# Patient Record
Sex: Male | Born: 1980
Health system: Southern US, Community
[De-identification: ages and names within clinical notes are randomized; demographics above are authoritative.]

## PROBLEM LIST (undated history)

## (undated) DIAGNOSIS — J45909 Unspecified asthma, uncomplicated: Secondary | ICD-10-CM

## (undated) HISTORY — DX: Unspecified asthma, uncomplicated: J45.909

## (undated) HISTORY — PX: OTHER SURGICAL HISTORY: SHX169

---

## 2020-02-28 ENCOUNTER — Other Ambulatory Visit: Payer: Self-pay

## 2020-02-28 ENCOUNTER — Encounter: Payer: Self-pay | Admitting: Allergy

## 2020-02-28 ENCOUNTER — Ambulatory Visit: Payer: Commercial Managed Care - PPO | Admitting: Allergy

## 2020-02-28 VITALS — BP 110/80 | HR 57 | Temp 97.2°F | Resp 16 | Ht 67.0 in | Wt 167.8 lb

## 2020-02-28 DIAGNOSIS — J3089 Other allergic rhinitis: Secondary | ICD-10-CM | POA: Insufficient documentation

## 2020-02-28 DIAGNOSIS — J4530 Mild persistent asthma, uncomplicated: Secondary | ICD-10-CM | POA: Diagnosis not present

## 2020-02-28 DIAGNOSIS — T781XXA Other adverse food reactions, not elsewhere classified, initial encounter: Secondary | ICD-10-CM

## 2020-02-28 DIAGNOSIS — H1013 Acute atopic conjunctivitis, bilateral: Secondary | ICD-10-CM | POA: Insufficient documentation

## 2020-02-28 MED ORDER — AZELASTINE-FLUTICASONE 137-50 MCG/ACT NA SUSP: 1.0000 | Freq: Two times a day (BID) | NASAL | 5 refills | Status: DC

## 2020-02-28 MED ORDER — ARNUITY ELLIPTA 100 MCG/ACT IN AEPB: 1.0000 | INHALATION_SPRAY | Freq: Every day | RESPIRATORY_TRACT | 5 refills | Status: DC

## 2020-02-28 NOTE — Assessment & Plan Note (Addendum)
Lately noticing that after drinking alcohol he is developing headaches and some red eyes for about 2 hours.  Today's skin testing showed: Borderline positive to barley and hops. This may be irrelevant sensitization as he is not having any IgE mediated reactions after alcohol ingestion.    Avoid alcohol for now as alcohol can trigger headaches.

## 2020-02-28 NOTE — Assessment & Plan Note (Signed)
Noting wheezing and shortness of breath for 1 month mainly at night.  Using albuterol every night with good benefit.  Previously had some mild exercise-induced bronchospasm.  Ex-smoker -quit in 2019.  Today's spirometry showed: normal pattern with 10% and greater than 200cc improvement in FEV1 post bronchodilator treatment. Clinically feeling improved.  This is concerning for reactive airway disease/asthma. . Daily controller medication(s): start Arnuity 1 puff once a day and rinse mouth after each use.  Demonstrated proper use. . May use albuterol rescue inhaler 2 puffs every 4 to 6 hours as needed for shortness of breath, chest tightness, coughing, and wheezing. May use albuterol rescue inhaler 2 puffs 5 to 15 minutes prior to strenuous physical activities. Monitor frequency of use.  . Repeat spirometry at next visit.

## 2020-02-28 NOTE — Assessment & Plan Note (Signed)
   See assessment and plan as above allergic rhinitis.  Declines eyedrops.

## 2020-02-28 NOTE — Assessment & Plan Note (Signed)
Perennial rhinoconjunctivitis symptoms for many years but worsening in the spring and fall.  Tried Zyrtec, Claritin, Allegra, Flonase, Sudafed with minimal benefit.  Going to ENT this week.  Four sinus infections this year.  Today's skin testing showed: Positive to grass, weed, ragweed, trees, mold, cockroach, dust mites.   Start environmental control measures as below.  May use over the counter antihistamines such as Zyrtec (cetirizine), Claritin (loratadine), Allegra (fexofenadine), or Xyzal (levocetirizine) daily as needed. May take twice a day if needed.  Start dymista (fluticasone + azelastine nasal spray combination) 1 spray per nostril twice a day. This replaces Flonase (fluticasone) for now. If it's not covered let us know.   If above regimen does not control symptoms then will discuss starting allergy immunotherapy in more detail at next visit.  Declines starting Singulair at this time.  Read about allergy injections - handout given.

## 2020-02-28 NOTE — Progress Notes (Signed)
New Patient Note  RE: Tracy Pugh MRN: [id number] DOB: Jul 16, 1980 Date of Office Visit: 02/28/2020  Referring provider: No ref. provider found Primary care provider: Patient, No Pcp Per  Chief Complaint: Allergies and Asthma  History of Present Illness: I had the pleasure of seeing Tracy Pugh for initial evaluation at the [facility] on 02/28/2020. He is a 39 y.o. male, who is self-referred here for the evaluation of allergies and asthma.  He reports symptoms of nasal congestion, rhinorrhea, PND, itchy/watery eyes. Symptoms have been going on for many years. The symptoms are present all year around with worsening in spring and fall. Other triggers include exposure to unknown. Anosmia: diminished sense of smell. Headache: yes. He has used zyrtec, Claritin, Allegra,, Flonase 1 spray once a day, sudafed with minimal improvement in symptoms. Sinus infections: 2 times per year but this year already had 4 - treated with antibiotics with minimal benefit. Previous work up includes: none. Previous ENT evaluation: no but going on Thursday. Previous sinus imaging: no. History of nasal polyps: no. Last eye exam: not recently. History of reflux: yes sometimes. Alcohol causes some headaches for 2 hours.   Breathing:  He reports symptoms of wheezing, shortness of breath at night for the past 1 month. Current medications include albuterol prn which help. He reports not using aerochamber with inhalers. He tried the following inhalers: albuterol prn. Main triggers are unknown and exertion. In the last month, frequency of symptoms: nightly. Frequency of nocturnal symptoms: 0x/month. Frequency of SABA use: nightly. Interference with physical activity: rarely. Sleep is undisturbed. In the last 12 months, emergency room visits/urgent care visits/doctor office visits or hospitalizations due to respiratory issues: 0. In the last 12 months, oral steroids courses: 2. Lifetime history of  hospitalization for respiratory issues: no. Prior intubations: no. History of pneumonia: no. He was not evaluated by allergist/pulmonologist in the past. Smoking exposure: quit in 2019. Up to date with flu vaccine: no. Up to date with COVID-19 vaccine: no.   Assessment and Plan: Tracy Pugh is a 39 y.o. male with: Mild persistent asthma without complication Noting wheezing and shortness of breath for 1 month mainly at night.  Using albuterol every night with good benefit.  Previously had some mild exercise-induced bronchospasm.  Ex-smoker -quit in 2019.  Today's spirometry showed: normal pattern with 10% and greater than 200cc improvement in FEV1 post bronchodilator treatment. Clinically feeling improved.  This is concerning for reactive airway disease/asthma. . Daily controller medication(s): start Arnuity 1 puff once a day and rinse mouth after each use.  Demonstrated proper use. . May use albuterol rescue inhaler 2 puffs every 4 to 6 hours as needed for shortness of breath, chest tightness, coughing, and wheezing. May use albuterol rescue inhaler 2 puffs 5 to 15 minutes prior to strenuous physical activities. Monitor frequency of use.  . Repeat spirometry at next visit.  Other allergic rhinitis Perennial rhinoconjunctivitis symptoms for many years but worsening in the spring and fall.  Tried Zyrtec, Claritin, Allegra, Flonase, Sudafed with minimal benefit.  Going to ENT this week.  Four sinus infections this year.  Today's skin testing showed: Positive to grass, weed, ragweed, trees, mold, cockroach, dust mites.   Start environmental control measures as below.  May use over the counter antihistamines such as Zyrtec (cetirizine), Claritin (loratadine), Allegra (fexofenadine), or Xyzal (levocetirizine) daily as needed. May take twice a day if needed.  Start dymista (fluticasone + azelastine nasal spray combination) 1 spray per nostril twice  a day. This replaces Flonase (fluticasone) for now.  If it's not covered let us know.   If above regimen does not control symptoms then will discuss starting allergy immunotherapy in more detail at next visit.  Declines starting Singulair at this time.  Read about allergy injections - handout given.  Allergic conjunctivitis of both eyes  See assessment and plan as above allergic rhinitis.  Declines eyedrops.  Adverse food reaction Lately noticing that after drinking alcohol he is developing headaches and some red eyes for about 2 hours.  Today's skin testing showed: Borderline positive to barley and hops. This may be irrelevant sensitization as he is not having any IgE mediated reactions after alcohol ingestion.    Avoid alcohol for now as alcohol can trigger headaches.   Return in about 2 months (around 04/29/2020).  Recommend establishing care with a PCP.  Keep ENT appointment.  Recommend getting COVID-19 vaccination.  Meds ordered this encounter  Medications  . Fluticasone Furoate (ARNUITY ELLIPTA) 100 MCG/ACT AEPB    Sig: Inhale 1 puff into the lungs daily. And rinse mouth after each use.    Dispense:  30 each    Refill:  5  . Azelastine-Fluticasone 137-50 MCG/ACT SUSP    Sig: Place 1 spray into the nose in the morning and at bedtime.    Dispense:  23 g    Refill:  5   Other allergy screening: Food allergy: no Medication allergy: no Hymenoptera allergy: no Urticaria: no Eczema:no History of recurrent infections suggestive of immunodeficency: no  Diagnostics: Spirometry:  Tracings reviewed. His effort: Good reproducible efforts. FVC: 4.51L FEV1: 3.66L, 93% predicted FEV1/FVC ratio: 81% Interpretation: Spirometry consistent with normal pattern with 10% and greater than 200cc improvement in FEV1 post bronchodilator treatment. Clinically feeling improved. Please see scanned spirometry results for details.  Skin Testing: Environmental allergy panel and select foods. Positive to grass, weed, ragweed, trees, mold,  cockroach, dust mites.  Borderline to barley and hops.  Results discussed with patient/family.  Airborne Adult Perc - 02/28/20 0945    Time Antigen Placed 1610    Allergen Manufacturer Waynette Buttery    Location Back    Number of Test 59    Panel 1 Select    1. Control-Buffer 50% Glycerol Negative    2. Control-Histamine 1 mg/ml 2+    3. Albumin saline Negative    4. Bahia 2+    5. [place] 2+    6. Johnson 2+    7. Kentucky Blue 2+    8. Meadow Fescue Negative    9. Perennial Rye Negative    10. Sweet Vernal Negative    11. Timothy Negative    12. Cocklebur Negative    13. Burweed Marshelder Negative    14. Ragweed, short 3+    15. Ragweed, Giant Negative    16. Plantain,  English 3+    17. Lamb's Quarters 2+    18. Sheep Sorrell 2+    19. Rough Pigweed 2+    20. Marsh Elder, Rough 2+    21. Mugwort, Common 2+    22. Ash mix 3+    23. Birch mix 2+    24. Beech American 2+    25. Box, Elder 3+    26. Cedar, red 2+    27. Cottonwood, Eastern 2+    28. Elm mix 2+    29. Hickory 2+    30. Maple mix Negative    31. Oak, Guinea[place]issau mix 2+    32.  Pecan Pollen 2+    33. Pine mix Negative    34. Sycamore Eastern 2+    35. Walnut, Black Pollen 2+    36. Alternaria alternata 3+    37. Cladosporium Herbarum --   +/-   38. Aspergillus mix Negative    39. Penicillium mix Negative    40. Bipolaris sorokiniana (Helminthosporium) Negative    41. Drechslera spicifera (Curvularia) Negative    42. Mucor plumbeus Negative    43. Fusarium moniliforme 2+    44. Aureobasidium pullulans (pullulara) Negative    45. Rhizopus oryzae Negative    46. Botrytis cinera Negative    47. Epicoccum nigrum 2+    48. Phoma betae Negative    49. Candida Albicans Negative    50. Trichophyton mentagrophytes Negative    51. Mite, D Farinae  5,000 AU/ml Negative    52. Mite, D Pteronyssinus  5,000 AU/ml Negative    53. Cat Hair 10,000 BAU/ml Negative    54.  Dog Epithelia Negative    55. Mixed Feathers  Negative    56. Horse Epithelia Negative    57. Cockroach, German Negative    58. Mouse Negative    59. Tobacco Leaf Negative          Intradermal - 02/28/20 1021    Time Antigen Placed 1021    Allergen Manufacturer Waynette Buttery    Location Arm    Number of Test 7    Control Negative    Mold 2 2+    Mold 3 Negative    Cat Negative    Dog Negative    Cockroach 3+    Mite mix 3+          Food Adult Perc - 02/28/20 0900    Time Antigen Placed 1505    Allergen Manufacturer Waynette Buttery    Location Back    Number of allergen test 6    3. Wheat Negative    30. Barley --   +/-   31. Oat  Negative    32. Rye  Negative    33. Hops --   +/-   34. Rice Negative    36. Saccharomyces Cerevisiae  Negative           Past Medical History: Patient Active Problem List   Diagnosis Date Noted  . Mild persistent asthma without complication 02/28/2020  . Other allergic rhinitis 02/28/2020  . Adverse food reaction 02/28/2020  . Allergic conjunctivitis of both eyes 02/28/2020   Past Medical History:  Diagnosis Date  . Asthma    Past Surgical History: History reviewed. No pertinent surgical history. Medication List:  Current Outpatient Medications  Medication Sig Dispense Refill  . cetirizine (ZYRTEC) 10 MG tablet Take 10 mg by mouth daily as needed for allergies.    Marland Kitchen albuterol (VENTOLIN HFA) 108 (90 Base) MCG/ACT inhaler Inhale into the lungs.    . Azelastine-Fluticasone 137-50 MCG/ACT SUSP Place 1 spray into the nose in the morning and at bedtime. 23 g 5  . Fluticasone Furoate (ARNUITY ELLIPTA) 100 MCG/ACT AEPB Inhale 1 puff into the lungs daily. And rinse mouth after each use. 30 each 5   No current facility-administered medications for this visit.   Allergies: No Known Allergies Social History: Social History   Socioeconomic History  . Marital status: Married    Spouse name: Not on file  . Number of children: Not on file  . Years of education: Not on file  . Highest education  level: Not  on file  Occupational History  . Not on file  Tobacco Use  . Smoking status: Former Smoker    Quit date: 2019    Years since quitting: 2.6  . Smokeless tobacco: Never Used  Vaping Use  . Vaping Use: Never used  Substance and Sexual Activity  . Alcohol use: Yes  . Drug use: Never  . Sexual activity: Not on file  Other Topics Concern  . Not on file  Social History Narrative  . Not on file   Social Determinants of Health   Financial Resource Strain:   . Difficulty of Paying Living Expenses: Not on file  Food Insecurity:   . Worried About Programme researcher, broadcasting/film/video in the Last Year: Not on file  . Ran Out of Food in the Last Year: Not on file  Transportation Needs:   . Lack of Transportation (Medical): Not on file  . Lack of Transportation (Non-Medical): Not on file  Physical Activity:   . Days of Exercise per Week: Not on file  . Minutes of Exercise per Session: Not on file  Stress:   . Feeling of Stress : Not on file  Social Connections:   . Frequency of Communication with Friends and Family: Not on file  . Frequency of Social Gatherings with Friends and Family: Not on file  . Attends Religious Services: Not on file  . Active Member of Clubs or Organizations: Not on file  . Attends Banker Meetings: Not on file  . Marital Status: Not on file   Lives in a house built in 1932. Smoking: quit in 2019 Occupation: Personnel officer History: Water Damage/mildew in the house: no Carpet in the family room: no Carpet in the bedroom: no Heating: electric Cooling: central Pet: yes 1 dog x 15 yrs  Family History: History reviewed. No pertinent family history. Problem                               Relation Allergic rhino conjunctivitis     Daughter   Review of Systems  Constitutional: Negative for appetite change, chills, fever and unexpected weight change.  HENT: Positive for congestion and rhinorrhea.   Eyes: Positive for itching.  Respiratory:  Positive for wheezing. Negative for cough, chest tightness and shortness of breath.   Cardiovascular: Negative for chest pain.  Gastrointestinal: Negative for abdominal pain.  Genitourinary: Negative for difficulty urinating.  Skin: Negative for rash.  Allergic/Immunologic: Positive for environmental allergies.  Neurological: Negative for headaches.   Objective: BP 110/80   Pulse (!) 57   Temp (!) 97.2 F (36.2 C) (Temporal)   Resp 16   Ht 5\' 7"  (1.702 m)   Wt 167 lb 12.8 oz (76.1 kg)   SpO2 97%   BMI 26.28 kg/m  Body mass index is 26.28 kg/m. Physical Exam Vitals and nursing note reviewed.  Constitutional:      Appearance: Normal appearance. He is well-developed.  HENT:     Head: Normocephalic and atraumatic.     Right Ear: External ear normal.     Left Ear: External ear normal.     Nose: Rhinorrhea present.     Mouth/Throat:     Mouth: Mucous membranes are moist.     Pharynx: Oropharynx is clear.  Eyes:     Conjunctiva/sclera: Conjunctivae normal.  Cardiovascular:     Rate and Rhythm: Normal rate and regular rhythm.     Heart sounds:  Normal heart sounds. No murmur heard.  No friction rub. No gallop.   Pulmonary:     Effort: Pulmonary effort is normal.     Breath sounds: Normal breath sounds. No wheezing, rhonchi or rales.  Musculoskeletal:     Cervical back: Neck supple.  Skin:    General: Skin is warm.     Findings: No rash.  Neurological:     Mental Status: He is alert and oriented to person, place, and time.  Psychiatric:        Behavior: Behavior normal.    The plan was reviewed with the patient/family, and all questions/concerned were addressed.  It was my pleasure to see Tracy Pugh today and participate in his care. Please feel free to contact me with any questions or concerns.  Sincerely,  Wyline MoodYoon Hillis Mcphatter, DO Allergy & Immunology  [facility] [place] office: [phone] [facility] office: [phone] [place] office:  [phone]

## 2020-02-28 NOTE — Patient Instructions (Addendum)
Today's skin testing showed: Positive to grass, weed, ragweed, trees, mold, cockroach, dust mites.  Borderline to barley and hops.   Environmental allergies  Start environmental control measures as below.  May use over the counter antihistamines such as Zyrtec (cetirizine), Claritin (loratadine), Allegra (fexofenadine), or Xyzal (levocetirizine) daily as needed. May take twice a day if needed.  Start dymista (fluticasone + azelastine nasal spray combination) 1 spray per nostril twice a day. This replaces Flonase (fluticasone) for now. If it's not covered let us know.   Read about allergy injections.   Breathing: . Daily controller medication(s): start Arnuity 1 puff once a day and rinse mouth after each use.  . May use albuterol rescue inhaler 2 puffs every 4 to 6 hours as needed for shortness of breath, chest tightness, coughing, and wheezing. May use albuterol rescue inhaler 2 puffs 5 to 15 minutes prior to strenuous physical activities. Monitor frequency of use.  . Asthma control goals:  o Full participation in all desired activities (may need albuterol before activity) o Albuterol use two times or less a week on average (not counting use with activity) o Cough interfering with sleep two times or less a month o Oral steroids no more than once a year o No hospitalizations  Food:  Avoid alcohol for now.  Follow up in 2 months or sooner if needed.  Recommend establishing care with a PCP.  Keep ENT appointment.   Recommend getting the COVID-19 vaccine.  Here's more information: [email or web address]  Reducing Pollen Exposure . Pollen seasons: trees (spring), grass (summer) and ragweed/weeds (fall). Marland Kitchen Keep windows closed in your home and car to lower pollen exposure.  Lilian Kapur air conditioning in the bedroom and throughout the house if possible.  . Avoid going out in dry windy days - especially early morning. . Pollen counts are highest between 5 - 10 AM  and on dry, hot and windy days.  . Save outside activities for late afternoon or after a heavy rain, when pollen levels are lower.  . Avoid mowing of grass if you have grass pollen allergy. Marland Kitchen Be aware that pollen can also be transported indoors on people and pets.  . Dry your clothes in an automatic dryer rather than hanging them outside where they might collect pollen.  . Rinse hair and eyes before bedtime. Mold Control . Mold and fungi can grow on a variety of surfaces provided certain temperature and moisture conditions exist.  . Outdoor molds grow on plants, decaying vegetation and soil. The major outdoor mold, Alternaria and Cladosporium, are found in very high numbers during hot and dry conditions. Generally, a late summer - fall peak is seen for common outdoor fungal spores. Rain will temporarily lower outdoor mold spore count, but counts rise rapidly when the rainy period ends. . The most important indoor molds are Aspergillus and Penicillium. Dark, humid and poorly ventilated basements are ideal sites for mold growth. The next most common sites of mold growth are the bathroom and the kitchen. Outdoor (Seasonal) Mold Control . Use air conditioning and keep windows closed. . Avoid exposure to decaying vegetation. Marland Kitchen Avoid leaf raking. . Avoid grain handling. . Consider wearing a face mask if working in moldy areas.  Indoor (Perennial) Mold Control  . Maintain humidity below 50%. . Get rid of mold growth on hard surfaces with water, detergent and, if necessary, 5% bleach (do not mix with other cleaners). Then dry the area completely. If mold covers an area more than 10  square feet, consider hiring an Gaffer. . For clothing, washing with soap and water is best. If moldy items cannot be cleaned and dried, throw them away. . Remove sources e.g. contaminated carpets. . Repair and seal leaking roofs or pipes. Using dehumidifiers in damp basements may be helpful, but  empty the water and clean units regularly to prevent mildew from forming. All rooms, especially basements, bathrooms and kitchens, require ventilation and cleaning to deter mold and mildew growth. Avoid carpeting on concrete or damp floors, and storing items in damp areas. Cockroach Allergen Avoidance Cockroaches are often found in the homes of densely populated urban areas, schools or commercial buildings, but these creatures can lurk almost anywhere. This does not mean that you have a dirty house or living area. . Block all areas where roaches can enter the home. This includes crevices, wall cracks and windows.  . Cockroaches need water to survive, so fix and seal all leaky faucets and pipes. Have an exterminator go through the house when your family and pets are gone to eliminate any remaining roaches. Marland Kitchen Keep food in lidded containers and put pet food dishes away after your pets are done eating. Vacuum and sweep the floor after meals, and take out garbage and recyclables. Use lidded garbage containers in the kitchen. Wash dishes immediately after use and clean under stoves, refrigerators or toasters where crumbs can accumulate. Wipe off the stove and other kitchen surfaces and cupboards regularly. Control of House Dust Mite Allergen . Dust mite allergens are a common trigger of allergy and asthma symptoms. While they can be found throughout the house, these microscopic creatures thrive in warm, humid environments such as bedding, upholstered furniture and carpeting. . Because so much time is spent in the bedroom, it is essential to reduce mite levels there.  . Encase pillows, mattresses, and box springs in special allergen-proof fabric covers or airtight, zippered plastic covers.  . Bedding should be washed weekly in hot water (130 F) and dried in a hot dryer. Allergen-proof covers are available for comforters and pillows that can't be regularly washed.  Reyes Ivan the allergy-proof covers every few  months. Minimize clutter in the bedroom. Keep pets out of the bedroom.  Marland Kitchen Keep humidity less than 50% by using a dehumidifier or air conditioning. You can buy a humidity measuring device called a hygrometer to monitor this.  . If possible, replace carpets with hardwood, linoleum, or washable area rugs. If that's not possible, vacuum frequently with a vacuum that has a HEPA filter. . Remove all upholstered furniture and non-washable window drapes from the bedroom. . Remove all non-washable stuffed toys from the bedroom.  Wash stuffed toys weekly.

## 2020-03-13 ENCOUNTER — Emergency Department (HOSPITAL_BASED_OUTPATIENT_CLINIC_OR_DEPARTMENT_OTHER)
Admission: EM | Admit: 2020-03-13 | Discharge: 2020-03-13 | Disposition: A | Discharge status: Home / Self Care | Payer: Commercial Managed Care - PPO | Attending: Emergency Medicine | Admitting: Emergency Medicine

## 2020-03-13 ENCOUNTER — Other Ambulatory Visit: Payer: Self-pay

## 2020-03-13 ENCOUNTER — Emergency Department (HOSPITAL_BASED_OUTPATIENT_CLINIC_OR_DEPARTMENT_OTHER): Payer: Commercial Managed Care - PPO

## 2020-03-13 ENCOUNTER — Encounter (HOSPITAL_BASED_OUTPATIENT_CLINIC_OR_DEPARTMENT_OTHER): Payer: Self-pay | Admitting: *Deleted

## 2020-03-13 DIAGNOSIS — J4530 Mild persistent asthma, uncomplicated: Secondary | ICD-10-CM | POA: Diagnosis not present

## 2020-03-13 DIAGNOSIS — Z7951 Long term (current) use of inhaled steroids: Secondary | ICD-10-CM | POA: Diagnosis not present

## 2020-03-13 DIAGNOSIS — Z87891 Personal history of nicotine dependence: Secondary | ICD-10-CM | POA: Diagnosis not present

## 2020-03-13 DIAGNOSIS — R519 Headache, unspecified: Secondary | ICD-10-CM | POA: Diagnosis present

## 2020-03-13 DIAGNOSIS — J0191 Acute recurrent sinusitis, unspecified: Secondary | ICD-10-CM | POA: Diagnosis not present

## 2020-03-13 LAB — CBC WITH DIFFERENTIAL/PLATELET
Abs Immature Granulocytes: 0.03 10*3/uL (ref 0.00–0.07)
Basophils Absolute: 0.1 10*3/uL (ref 0.0–0.1)
Basophils Relative: 1 %
Eosinophils Absolute: 0.2 10*3/uL (ref 0.0–0.5)
Eosinophils Relative: 2 %
HCT: 49.6 % (ref 39.0–52.0)
Hemoglobin: 16.9 g/dL (ref 13.0–17.0)
Immature Granulocytes: 0 %
Lymphocytes Relative: 28 %
Lymphs Abs: 2.7 10*3/uL (ref 0.7–4.0)
MCH: 30.3 pg (ref 26.0–34.0)
MCHC: 34.1 g/dL (ref 30.0–36.0)
MCV: 89 fL (ref 80.0–100.0)
Monocytes Absolute: 0.9 10*3/uL (ref 0.1–1.0)
Monocytes Relative: 9 %
Neutro Abs: 5.8 10*3/uL (ref 1.7–7.7)
Neutrophils Relative %: 60 %
Platelets: 348 10*3/uL (ref 150–400)
RBC: 5.57 MIL/uL (ref 4.22–5.81)
RDW: 11.9 % (ref 11.5–15.5)
WBC: 9.6 10*3/uL (ref 4.0–10.5)
nRBC: 0 % (ref 0.0–0.2)

## 2020-03-13 LAB — BASIC METABOLIC PANEL
Anion gap: 13 (ref 5–15)
BUN: 7 mg/dL (ref 6–20)
CO2: 26 mmol/L (ref 22–32)
Calcium: 9.1 mg/dL (ref 8.9–10.3)
Chloride: 99 mmol/L (ref 98–111)
Creatinine, Ser: 0.88 mg/dL (ref 0.61–1.24)
GFR calc Af Amer: >60 mL/min (ref >60)
GFR calc non Af Amer: >60 mL/min (ref >60)
Glucose, Bld: 97 mg/dL (ref 70–99)
Potassium: 3.8 mmol/L (ref 3.5–5.1)
Sodium: 138 mmol/L (ref 135–145)

## 2020-03-13 MED ORDER — SALINE SPRAY 0.65 % NA SOLN: 1.0000 | NASAL | 0 refills | Status: AC | PRN

## 2020-03-13 MED ORDER — METOCLOPRAMIDE HCL 10 MG PO TABS
5.0000 mg | ORAL_TABLET | Freq: Once | ORAL | Status: AC
  Administered 2020-03-13: 5 mg via ORAL
  Filled 2020-03-13: qty 1

## 2020-03-13 MED ORDER — IOHEXOL 300 MG/ML  SOLN
100.0000 mL | Freq: Once | INTRAMUSCULAR | Status: AC | PRN
  Administered 2020-03-13: 75 mL via INTRAVENOUS

## 2020-03-13 MED ORDER — AMOXICILLIN-POT CLAVULANATE 875-125 MG PO TABS: 1.0000 | ORAL_TABLET | Freq: Two times a day (BID) | ORAL | 0 refills | Status: DC

## 2020-03-13 MED ORDER — KETOROLAC TROMETHAMINE 30 MG/ML IJ SOLN
30.0000 mg | Freq: Once | INTRAMUSCULAR | Status: AC
  Administered 2020-03-13: 30 mg via INTRAVENOUS

## 2020-03-13 MED ORDER — KETOROLAC TROMETHAMINE 30 MG/ML IJ SOLN
30.0000 mg | Freq: Once | INTRAMUSCULAR | Status: DC
  Filled 2020-03-13: qty 1

## 2020-03-13 NOTE — Discharge Instructions (Signed)
You were given a prescription for antibiotics. Please take the antibiotic prescription fully.   Please follow up with your primary care provider within 5-7 days for re-evaluation of your symptoms. If you do not have a primary care provider, information for a healthcare clinic has been provided for you to make arrangements for follow up care. Please return to the emergency department for any new or worsening symptoms.  

## 2020-03-13 NOTE — ED Provider Notes (Signed)
[facility] EMERGENCY DEPARTMENT Provider Note   CSN: [id number] Arrival date & time: 03/13/20  1226     History Chief Complaint  Patient presents with  . Facial Pain    Tracy Pugh is a 39 y.o. male.  HPI   Pt is a 39 y/o male with a h/o asthma who presents to the ED today for eval of right sided facial pain. He is c/o pain behind the right eye, in the right ear and in the right side of his jaw.  States he has a history of recurrent sinusitis and is on nasal decongestions and is currently on oral steroids as well.  He last had antibiotics about 1 month ago.  He reports nasal congestion, rhinorrhea but denies fevers.   States he called his ent office for an earlier appt. He is scheduled for an outpt ct.  Past Medical History:  Diagnosis Date  . Asthma     Patient Active Problem List   Diagnosis Date Noted  . Mild persistent asthma without complication 02/28/2020  . Other allergic rhinitis 02/28/2020  . Adverse food reaction 02/28/2020  . Allergic conjunctivitis of both eyes 02/28/2020    History reviewed. No pertinent surgical history.     History reviewed. No pertinent family history.  Social History   Tobacco Use  . Smoking status: Former Smoker    Quit date: 2019    Years since quitting: 2.7  . Smokeless tobacco: Never Used  Vaping Use  . Vaping Use: Never used  Substance Use Topics  . Alcohol use: Yes  . Drug use: Never    Home Medications Prior to Admission medications   Medication Sig Start Date End Date Taking? Authorizing Provider  albuterol (VENTOLIN HFA) 108 (90 Base) MCG/ACT inhaler Inhale into the lungs. 12/07/19   [provider]  amoxicillin-clavulanate (AUGMENTIN) 875-125 MG tablet Take 1 tablet by mouth every 12 (twelve) hours. 03/13/20   Joao Mccurdy S, PA-C  Azelastine-Fluticasone 137-50 MCG/ACT SUSP Place 1 spray into the nose in the morning and at bedtime. 02/28/20   Ellamae Sia, DO  cetirizine (ZYRTEC) 10 MG  tablet Take 10 mg by mouth daily as needed for allergies.    [provider]  Fluticasone Furoate (ARNUITY ELLIPTA) 100 MCG/ACT AEPB Inhale 1 puff into the lungs daily. And rinse mouth after each use. 02/28/20   Ellamae Sia, DO  sodium chloride (OCEAN) 0.65 % SOLN nasal spray Place 1 spray into both nostrils as needed for congestion. 03/13/20   Coleton Woon S, PA-C    Allergies    Patient has no known allergies.  Review of Systems   Review of Systems  Constitutional: Negative for fever.  HENT: Positive for congestion, ear pain, sinus pressure and sinus pain.   Eyes: Positive for pain.  Respiratory: Negative for cough and shortness of breath.   Cardiovascular: Negative for chest pain.  Gastrointestinal: Negative for abdominal pain, nausea and vomiting.  Genitourinary: Negative for dysuria and hematuria.  Musculoskeletal: Negative for back pain.  Skin: Negative for rash.  Neurological: Positive for headaches. Negative for dizziness, weakness, light-headedness and numbness.  All other systems reviewed and are negative.   Physical Exam Updated Vital Signs BP 119/79   Pulse (!) 57   Temp 98.7 F (37.1 C) (Oral)   Resp 16   Ht 5\' 7"  (1.702 m)   Wt 74.8 kg   SpO2 99%   BMI 25.84 kg/m   Physical Exam Vitals and nursing note reviewed.  Constitutional:      Appearance: He is well-developed.  HENT:     Head: Normocephalic and atraumatic.     Comments: ttp over the right maxillary sinus    Left Ear: Tympanic membrane normal.     Ears:     Comments: Right tm erythematous.    Nose: Congestion present.     Comments: Nasal turbinates swollen nasal turbinates are swollen    Mouth/Throat:     Pharynx: No oropharyngeal exudate or posterior oropharyngeal erythema.  Eyes:     General:        Right eye: No discharge.     Extraocular Movements: Extraocular movements intact.     Conjunctiva/sclera: Conjunctivae normal.     Pupils: Pupils are equal, round, and reactive to  light.     Comments: No swelling around the orbits. No pain with EOM.  Cardiovascular:     Rate and Rhythm: Normal rate and regular rhythm.     Heart sounds: No murmur heard.   Pulmonary:     Effort: Pulmonary effort is normal. No respiratory distress.     Breath sounds: Normal breath sounds.  Abdominal:     General: Bowel sounds are normal.     Palpations: Abdomen is soft.     Tenderness: There is no abdominal tenderness.  Musculoskeletal:     Cervical back: Neck supple.  Skin:    General: Skin is warm and dry.  Neurological:     Mental Status: He is alert.     Comments: Mental Status:  Alert, thought content appropriate, able to give a coherent history. Speech fluent without evidence of aphasia. Able to follow 2 step commands without difficulty.  Cranial Nerves:  II:  pupils equal, round, reactive to light III,IV, VI: ptosis not present, extra-ocular motions intact bilaterally  V,VII: smile symmetric, facial light touch sensation equal VIII: hearing grossly normal to voice  X: uvula elevates symmetrically  XI: bilateral shoulder shrug symmetric and strong XII: midline tongue extension without fassiculations Motor:  Normal tone. 5/5 strength of BUE and BLE major muscle groups including strong and equal grip strength and dorsiflexion/plantar flexion Sensory: light touch normal in all extremities.     ED Results / Procedures / Treatments   Labs (all labs ordered are listed, but only abnormal results are displayed) Labs Reviewed  BASIC METABOLIC PANEL  CBC WITH DIFFERENTIAL/PLATELET    EKG None  Radiology CT Maxillofacial W Contrast  Result Date: 03/13/2020 CLINICAL DATA:  Maxillofacial pain; recurrent sinus infection. Additional provided: Patient reports ongoing right-sided facial pain with ear, jaw and eye pain for 6 weeks. EXAM: CT MAXILLOFACIAL WITH CONTRAST TECHNIQUE: Multidetector CT imaging of the maxillofacial structures was performed with intravenous contrast.  Multiplanar CT image reconstructions were also generated. CONTRAST:  74mL OMNIPAQUE IOHEXOL 300 MG/ML  SOLN COMPARISON:  No pertinent prior exams are available for comparison. FINDINGS: Osseous: No osseous erosion is identified. Orbits: No acute finding. The globes are normal in size and contour. The extraocular muscles and optic nerve sheath complexes are symmetric and unremarkable. Sinuses: There is extensive partial opacification of the left frontal sinus. Mild partial opacification of the inferior right frontal sinus. Complete opacification of the frontal sinus drainage pathways. Severe, extensive opacification of bilateral ethmoid air cells. Complete opacification of the right maxillary sinus. There is possible widening of the right maxillary sinus ostium and ethmoid infundibulum (for instance as seen on series 4, images 30-40). There is opacification of the right hiatus semilunaris and middle meatus. Moderate-sized mucous  retention cyst versus polypoid mucosal thickening within the inferior left maxillary sinus. The left maxillary sinus ostium is partially obstructed by secretions. Mild bilateral sphenoid sinus mucosal thickening Soft tissues: The visualized maxillofacial and upper neck soft tissues are unremarkable. Limited intracranial: No acute intracranial abnormality is identified. Other: Rightward deviation of the anterior bony nasal septum. IMPRESSION: Extensive paranasal sinus disease as outlined and with findings most notably as follows. There is complete opacification of the right maxillary sinus with opacification and possible widening of the right maxillary sinus ostium and ethmoid infundibulum. There is complete opacification of the right hiatus semilunaris and middle meatus. Extensive partial opacification of the left frontal sinus. Complete opacification of the frontal sinus drainage pathways. Extensive opacification of the bilateral ethmoid air cells. ENT consultation should be considered.  There is no evidence of frank osseous erosion or orbital extension. Electronically Signed   By: Jackey LogeKyle  Golden DO   On: 03/13/2020 18:08    Procedures Procedures (including critical care time)  Medications Ordered in ED Medications  metoCLOPramide (REGLAN) tablet 5 mg (5 mg Oral Given 03/13/20 1644)  ketorolac (TORADOL) 30 MG/ML injection 30 mg (30 mg Intravenous Given 03/13/20 1654)  iohexol (OMNIPAQUE) 300 MG/ML solution 100 mL (75 mLs Intravenous Contrast Given 03/13/20 1742)    ED Course  I have reviewed the triage vital signs and the nursing notes.  Pertinent labs & imaging results that were available during my care of the patient were reviewed by me and considered in my medical decision making (see chart for details).    MDM Rules/Calculators/A&P                          39 year old male presenting for evaluation of sinus pressors/pain, right-sided headache.  Neurologic exam is within normal limits on my eval.  Right TM is erythematous.  He also has tenderness over the right maxillary sinus with nasal congestion and swollen nasal turbinates.  Reviewed/interpreted labs which were reassuring and did not show evidence of leukocytosis.  Normal renal function.  CT maxillofacial with contrast was ordered which showed - Extensive paranasal sinus disease as outlined and with findings most notably as follows. There is complete opacification of the right maxillary sinus with opacification and possible widening of the right maxillary sinus ostium and ethmoid infundibulum. There is complete opacification of the right hiatus semilunaris and middle meatus. Extensive partial opacification of the left frontal sinus. Complete opacification of the frontal sinus drainage pathways. Extensive opacification of the bilateral ethmoid air cells. ENT consultation should be considered. There is no evidence of frank osseous erosion or orbital extension.  6:42 PM CONSULT with Dr. Jackalyn LombardFourrier with pts ENT group. She  is in agreement with plan for abx. Recommends saline rinses or rinses with neti pot. Agrees with outpt f/u.   Reassessed patient and discussed plan for discharge with antibiotics and close ENT follow-up. Advised on strict return precautions. He voiced understanding agrees to return but all questions answered patient able to discharge.  Final Clinical Impression(s) / ED Diagnoses Final diagnoses:  Acute recurrent sinusitis, unspecified location    Rx / DC Orders ED Discharge Orders         Ordered    amoxicillin-clavulanate (AUGMENTIN) 875-125 MG tablet  Every 12 hours        03/13/20 1841    sodium chloride (OCEAN) 0.65 % SOLN nasal spray  As needed        03/13/20 1848  Rayne Du 03/13/20 1848    Pollyann Savoy, MD 03/13/20 2308

## 2020-03-13 NOTE — ED Triage Notes (Signed)
Pt reports ongoing right sided facial pain with ear, jaw and eye pain x 6 weeks. Pt has seen ENT and is scheduled for a CT scan.

## 2020-05-01 ENCOUNTER — Ambulatory Visit: Payer: Commercial Managed Care - PPO | Admitting: Allergy

## 2020-05-01 NOTE — Progress Notes (Deleted)
Follow Up Note  RE: Tracy Pugh MRN: [id number] DOB: Jul 07, 1980 Date of Office Visit: 05/01/2020  Referring provider: No ref. provider found Primary care provider: Patient, No Pcp Per  Chief Complaint: No chief complaint on file.  History of Present Illness: I had the pleasure of seeing Tracy Pugh for a follow up visit at the [facility] [place] on 05/01/2020. He is a 39 y.o. male, who is being followed for asthma, allergic rhinoconjunctivitis, adverse food reaction. His previous allergy office visit was on 02/28/2020 with Dr. Maudie Mercury. Today is a regular follow up visit.  Mild persistent asthma without complication Noting wheezing and shortness of breath for 1 month mainly at night.  Using albuterol every night with good benefit.  Previously had some mild exercise-induced bronchospasm.  Ex-smoker -quit in 2019.  Today's spirometry showed: normal pattern with 10% and greater than 200cc improvement in FEV1 post bronchodilator treatment. Clinically feeling improved.  This is concerning for reactive airway disease/asthma.  Daily controller medication(s):start Arnuity 1109mg 1 puff once a day and rinse mouth after each use.  Demonstrated proper use.  May use albuterol rescue inhaler 2 puffs every 4 to 6 hours as needed for shortness of breath, chest tightness, coughing, and wheezing. May use albuterol rescue inhaler 2 puffs 5 to 15 minutes prior to strenuous physical activities. Monitor frequency of use.   Repeat spirometry at next visit.  Other allergic rhinitis Perennial rhinoconjunctivitis symptoms for many years but worsening in the spring and fall.  Tried Zyrtec, Claritin, Allegra, Flonase, Sudafed with minimal benefit.  Going to ENT this week.  Four sinus infections this year.  Today's skin testing showed: Positive to grass, weed, ragweed, trees, mold, cockroach, dust mites.   Start environmental control measures as below.  May use over the counter antihistamines  such as Zyrtec (cetirizine), Claritin (loratadine), Allegra (fexofenadine), or Xyzal (levocetirizine) daily as needed. May take twice a day if needed.   Start dymista (fluticasone + azelastine nasal spray combination) 1 spray per nostril twice a day. This replaces Flonase (fluticasone) for now. If it's not covered let [place].   If above regimen does not control symptoms then will discuss starting allergy immunotherapy in more detail at next visit.  Declines starting Singulair at this time.  Read about allergy injections - handout given.  Allergic conjunctivitis of both eyes  See assessment and plan as above allergic rhinitis.  Declines eyedrops.  Adverse food reaction Lately noticing that after drinking alcohol he is developing headaches and some red eyes for about 2 hours.  Today's skin testing showed: Borderline positive to barley and hops. This may be irrelevant sensitization as he is not having any IgE mediated reactions after alcohol ingestion.    Avoid alcohol for now as alcohol can trigger headaches.   Return in about 2 months (around 04/29/2020).  Recommend establishing care with a PCP.  Keep ENT appointment.  Recommend getting COVID-19 vaccination.  03/01/2020 ENT visit: "ASSESSMENT:  Chronic sinusitis with nasal polyposis -- years of sinonasal symptoms with only transient Improvement on oral steroids. Noted nasal polyps and thick mucoid drainage on endoscopy. We discussed the utility of a post-treatment Sinus CT. Nasal obstruction  Nasal septal deviation Inferior turbinate hypertrophy Allergic rhinitis -- positive testing. No previous IT. Using Fluticasone-Azelastine nasal spray.  PLAN: We've discussed issues and options today. We reviewed his nasal endoscopy images together. The risks, benefits and alternatives were discussed and questions answered. He has elected to proceed with:  1) Prednisone Taper --  risks discussed. 2) Continue Fluticasone-Azelastine nasal  spray. 3) Nasal saline irrigations -- NeilMed Sinus Rinse Kit (over-the-counter)  4) Follow-up in 4-6 with Sinus CT -- sooner as necessary."  03/13/2020 CT: "IMPRESSION: Extensive paranasal sinus disease as outlined and with findings most notably as follows.  There is complete opacification of the right maxillary sinus with opacification and possible widening of the right maxillary sinus ostium and ethmoid infundibulum. There is complete opacification of the right hiatus semilunaris and middle meatus.  Extensive partial opacification of the left frontal sinus. Complete opacification of the frontal sinus drainage pathways.  Extensive opacification of the bilateral ethmoid air cells." Assessment and Plan: Tracy Pugh is a 39 y.o. male with: No problem-specific Assessment & Plan notes found for this encounter.  No follow-ups on file.  No orders of the defined types were placed in this encounter.  Lab Orders  No laboratory test(s) ordered today    Diagnostics: Spirometry:  Tracings reviewed. His effort: {Blank single:19197::"Good reproducible efforts.","It was hard to get consistent efforts and there is a question as to whether this reflects a maximal maneuver.","Poor effort, data can not be interpreted."} FVC: ***L FEV1: ***L, ***% predicted FEV1/FVC ratio: ***% Interpretation: {Blank single:19197::"Spirometry consistent with mild obstructive disease","Spirometry consistent with moderate obstructive disease","Spirometry consistent with severe obstructive disease","Spirometry consistent with possible restrictive disease","Spirometry consistent with mixed obstructive and restrictive disease","Spirometry uninterpretable due to technique","Spirometry consistent with normal pattern","No overt abnormalities noted given today's efforts"}.  Please see scanned spirometry results for details.  Skin Testing: {Blank single:19197::"Select foods","Environmental allergy panel","Environmental  allergy panel and select foods","Food allergy panel","None","Deferred due to recent antihistamines use"}. Positive test to: ***. Negative test to: ***.  Results discussed with patient/family.   Medication List:  Current Outpatient Medications  Medication Sig Dispense Refill  . albuterol (VENTOLIN HFA) 108 (90 Base) MCG/ACT inhaler Inhale into the lungs.    Marland Kitchen amoxicillin-clavulanate (AUGMENTIN) 875-125 MG tablet Take 1 tablet by mouth every 12 (twelve) hours. 14 tablet 0  . Azelastine-Fluticasone 137-50 MCG/ACT SUSP Place 1 spray into the nose in the morning and at bedtime. 23 g 5  . cetirizine (ZYRTEC) 10 MG tablet Take 10 mg by mouth daily as needed for allergies.    . Fluticasone Furoate (ARNUITY ELLIPTA) 100 MCG/ACT AEPB Inhale 1 puff into the lungs daily. And rinse mouth after each use. 30 each 5  . sodium chloride (OCEAN) 0.65 % SOLN nasal spray Place 1 spray into both nostrils as needed for congestion. 60 mL 0   No current facility-administered medications for this visit.   Allergies: No Known Allergies I reviewed his past medical history, social history, family history, and environmental history and no significant changes have been reported from his previous visit.  Review of Systems  Constitutional: Negative for appetite change, chills, fever and unexpected weight change.  HENT: Positive for congestion and rhinorrhea.   Eyes: Positive for itching.  Respiratory: Positive for wheezing. Negative for cough, chest tightness and shortness of breath.   Cardiovascular: Negative for chest pain.  Gastrointestinal: Negative for abdominal pain.  Genitourinary: Negative for difficulty urinating.  Skin: Negative for rash.  Allergic/Immunologic: Positive for environmental allergies.  Neurological: Negative for headaches.   Objective: There were no vitals taken for this visit. There is no height or weight on file to calculate BMI. Physical Exam Vitals and nursing note reviewed.   Constitutional:      Appearance: Normal appearance. He is well-developed.  HENT:     Head: Normocephalic and atraumatic.     Right  Ear: External ear normal.     Left Ear: External ear normal.     Nose: Rhinorrhea present.     Mouth/Throat:     Mouth: Mucous membranes are moist.     Pharynx: Oropharynx is clear.  Eyes:     Conjunctiva/sclera: Conjunctivae normal.  Cardiovascular:     Rate and Rhythm: Normal rate and regular rhythm.     Heart sounds: Normal heart sounds. No murmur heard.  No friction rub. No gallop.   Pulmonary:     Effort: Pulmonary effort is normal.     Breath sounds: Normal breath sounds. No wheezing, rhonchi or rales.  Musculoskeletal:     Cervical back: Neck supple.  Skin:    General: Skin is warm.     Findings: No rash.  Neurological:     Mental Status: He is alert and oriented to person, place, and time.  Psychiatric:        Behavior: Behavior normal.    Previous notes and tests were reviewed. The plan was reviewed with the patient/family, and all questions/concerned were addressed.  It was my pleasure to see Tracy Pugh today and participate in his care. Please feel free to contact me with any questions or concerns.  Sincerely,  Rexene Alberts, DO Allergy & Immunology  [facility] office: [place] office: [phone]

## 2020-09-10 ENCOUNTER — Other Ambulatory Visit: Payer: Self-pay

## 2020-09-10 ENCOUNTER — Inpatient Hospital Stay (HOSPITAL_COMMUNITY)
Admission: EM | Admit: 2020-09-10 | Discharge: 2020-09-14 | DRG: 871 | Disposition: A | Discharge status: Home / Self Care | Payer: Commercial Managed Care - PPO | Attending: Internal Medicine | Admitting: Internal Medicine

## 2020-09-10 ENCOUNTER — Emergency Department (HOSPITAL_COMMUNITY): Payer: Commercial Managed Care - PPO

## 2020-09-10 DIAGNOSIS — J159 Unspecified bacterial pneumonia: Secondary | ICD-10-CM | POA: Diagnosis present

## 2020-09-10 DIAGNOSIS — A419 Sepsis, unspecified organism: Secondary | ICD-10-CM

## 2020-09-10 DIAGNOSIS — Z8701 Personal history of pneumonia (recurrent): Secondary | ICD-10-CM

## 2020-09-10 DIAGNOSIS — E876 Hypokalemia: Secondary | ICD-10-CM | POA: Diagnosis present

## 2020-09-10 DIAGNOSIS — A4189 Other specified sepsis: Secondary | ICD-10-CM | POA: Diagnosis not present

## 2020-09-10 DIAGNOSIS — J4531 Mild persistent asthma with (acute) exacerbation: Secondary | ICD-10-CM | POA: Diagnosis present

## 2020-09-10 DIAGNOSIS — R0602 Shortness of breath: Secondary | ICD-10-CM | POA: Diagnosis not present

## 2020-09-10 DIAGNOSIS — Z79899 Other long term (current) drug therapy: Secondary | ICD-10-CM

## 2020-09-10 DIAGNOSIS — J324 Chronic pansinusitis: Secondary | ICD-10-CM | POA: Diagnosis present

## 2020-09-10 DIAGNOSIS — Z87891 Personal history of nicotine dependence: Secondary | ICD-10-CM

## 2020-09-10 DIAGNOSIS — Z7951 Long term (current) use of inhaled steroids: Secondary | ICD-10-CM

## 2020-09-10 DIAGNOSIS — J9601 Acute respiratory failure with hypoxia: Secondary | ICD-10-CM | POA: Diagnosis present

## 2020-09-10 DIAGNOSIS — J339 Nasal polyp, unspecified: Secondary | ICD-10-CM | POA: Diagnosis present

## 2020-09-10 DIAGNOSIS — J189 Pneumonia, unspecified organism: Secondary | ICD-10-CM

## 2020-09-10 DIAGNOSIS — E872 Acidosis: Secondary | ICD-10-CM | POA: Diagnosis present

## 2020-09-10 DIAGNOSIS — U071 COVID-19: Secondary | ICD-10-CM | POA: Diagnosis present

## 2020-09-10 DIAGNOSIS — D721 Eosinophilia, unspecified: Secondary | ICD-10-CM | POA: Diagnosis present

## 2020-09-10 DIAGNOSIS — J45901 Unspecified asthma with (acute) exacerbation: Secondary | ICD-10-CM

## 2020-09-10 MED ORDER — SODIUM CHLORIDE 0.9 % IV SOLN
2.0000 g | INTRAVENOUS | Status: DC
  Administered 2020-09-10: 2 g via INTRAVENOUS
  Filled 2020-09-10: qty 20

## 2020-09-10 MED ORDER — LACTATED RINGERS IV BOLUS (SEPSIS)
1000.0000 mL | Freq: Once | INTRAVENOUS | Status: AC
  Administered 2020-09-10: 1000 mL via INTRAVENOUS

## 2020-09-10 MED ORDER — SODIUM CHLORIDE 0.9 % IV SOLN
500.0000 mg | INTRAVENOUS | Status: DC
  Administered 2020-09-11: 500 mg via INTRAVENOUS
  Filled 2020-09-10: qty 500

## 2020-09-10 MED ORDER — LACTATED RINGERS IV BOLUS (SEPSIS)
500.0000 mL | Freq: Once | INTRAVENOUS | Status: AC
  Administered 2020-09-10: 500 mL via INTRAVENOUS

## 2020-09-10 MED ORDER — LACTATED RINGERS IV SOLN: INTRAVENOUS | Status: AC

## 2020-09-10 MED ORDER — ALBUTEROL (5 MG/ML) CONTINUOUS INHALATION SOLN
10.0000 mg/h | INHALATION_SOLUTION | Freq: Once | RESPIRATORY_TRACT | Status: AC
  Administered 2020-09-10: 10 mg/h via RESPIRATORY_TRACT
  Filled 2020-09-10: qty 20

## 2020-09-10 MED ORDER — KETOROLAC TROMETHAMINE 15 MG/ML IJ SOLN
15.0000 mg | Freq: Once | INTRAMUSCULAR | Status: AC
  Administered 2020-09-10: 15 mg via INTRAVENOUS
  Filled 2020-09-10: qty 1

## 2020-09-10 MED ORDER — IPRATROPIUM BROMIDE 0.02 % IN SOLN
0.5000 mg | Freq: Once | RESPIRATORY_TRACT | Status: AC
  Administered 2020-09-10: 0.5 mg via RESPIRATORY_TRACT
  Filled 2020-09-10 (×2): qty 2.5

## 2020-09-10 NOTE — ED Triage Notes (Signed)
[facility]. Hx: asthma. Pt has had more frequent asthma attacks in past few weeks. Usually albuterol helps today couldn't get it under control. Wife took pt to the fire station. When EMS arrived pt was wheezing and unable to make complete sentences. EMS gave 10mg  albuterol, 0.5 atrovent, 0.3 epi IM. 125mg  solumedrol, 2g Mag. Pt is still wheezing. On arrival to ED, pt on NRB.  135/70 22 RR 120 Heart rate 95% on NRB.

## 2020-09-10 NOTE — ED Notes (Signed)
RT contacted about continuous neb

## 2020-09-10 NOTE — ED Provider Notes (Signed)
I saw and evaluated the patient, reviewed the resident's note and I agree with the findings and plan.  Pertinent History: This patient is a 40 year old male, he has developed some abnormal lung disease over the last several months, he reports possibly having some exercise-induced asthma as he was in his later years but is never had anything like this.  He developed Covid in January, improved however within the last 3 weeks has developed recurrent shortness of breath increasing wheezing coughing and tonight presents with fever.  His symptoms are severe persistent and worse with trying to take a deep breath.  He has no swelling of the legs, no associated vomiting but he has had diarrhea.  Pertinent Exam findings: The patient has severe increased work of breathing, he is tachypneic, he speaks in shortened sentences, he does have some accessory muscle use and has a prolonged expiratory phase with diffuse wheezing.  His temperature is 102.8, his pulse is 126, his blood pressure is 154/75 and his oxygen on supplemental is 95% on a nonrebreather with continuous nebulizer.  This patient is critically ill-appearing  I was personally present and directly supervised the following procedures:  Medical evaluation Resuscitative care Critical care  The patient does have evidence of infiltrate on the x-ray, with his sepsis syndrome he will need to be admitted to the hospital.  Sepsis order set will be used to get blood cultures, lactic acid antibiotics.  .Critical Care Performed by: Eber Hong, MD Authorized by: Eber Hong, MD   Critical care provider statement:    Critical care time (minutes):  35   Critical care time was exclusive of:  Separately billable procedures and treating other patients and teaching time   Critical care was necessary to treat or prevent imminent or life-threatening deterioration of the following conditions:  Sepsis and respiratory failure   Critical care was time spent  personally by me on the following activities:  Blood draw for specimens, development of treatment plan with patient or surrogate, discussions with consultants, evaluation of patient's response to treatment, examination of patient, obtaining history from patient or surrogate, ordering and performing treatments and interventions, ordering and review of laboratory studies, ordering and review of radiographic studies, pulse oximetry, re-evaluation of patient's condition and review of old charts   I personally interpreted the EKG as well as the resident and agree with the interpretation on the resident's chart.  Final diagnoses:  Exacerbation of asthma, unspecified asthma severity, unspecified whether persistent  Multifocal pneumonia      Eber Hong, MD 09/13/20 1456

## 2020-09-10 NOTE — ED Provider Notes (Signed)
[facility] EMERGENCY DEPARTMENT Provider Note   CSN: [id number] Arrival date & time: 09/10/20  2128     History Chief Complaint  Patient presents with  . Asthma    Tracy Pugh is a 40 y.o. male.  Patient is a 40 year old male with a medical history of asthma who presents with shortness of breath.  Patient states that he became acutely short of breath this afternoon.  He was doing stuff at work when he started coughing and feeling like he was wheezing.  Patient sat down and took his albuterol inhaler.  This did not relieve his symptoms.  Patient called his wife who took him to the fire station.  Patient was transported to the hospital via EMS.  With them he received DuoNeb's, magnesium, Solu-Medrol, and IM epi.  Patient symptoms only mildly improved.  Patient arrives on nonrebreather.  He reports shortness of breath.  He denies chest pain.  He denies lower extremity swelling or pain.  He does report cough.  Patient states that he was diagnosed with a pneumonia a few weeks back and was on azithromycin and steroids.  Initially improved his symptoms.  Patient also with Covid back in January.  Denies fevers and chills at home.  Denies sick contacts.    Past Medical History:  Diagnosis Date  . Asthma     Patient Active Problem List   Diagnosis Date Noted  . Mild persistent asthma without complication 02/28/2020  . Other allergic rhinitis 02/28/2020  . Adverse food reaction 02/28/2020  . Allergic conjunctivitis of both eyes 02/28/2020    No past surgical history on file.     No family history on file.  Social History   Tobacco Use  . Smoking status: Former Smoker    Quit date: 2019    Years since quitting: 3.2  . Smokeless tobacco: Never Used  Vaping Use  . Vaping Use: Never used  Substance Use Topics  . Alcohol use: Yes  . Drug use: Never    Home Medications Prior to Admission medications   Medication Sig Start Date End Date Taking? Authorizing  Provider  albuterol (VENTOLIN HFA) 108 (90 Base) MCG/ACT inhaler Inhale into the lungs. 12/07/19   [provider]  amoxicillin-clavulanate (AUGMENTIN) 875-125 MG tablet Take 1 tablet by mouth every 12 (twelve) hours. 03/13/20   Couture, Cortni S, PA-C  Azelastine-Fluticasone 137-50 MCG/ACT SUSP Place 1 spray into the nose in the morning and at bedtime. 02/28/20   Ellamae Sia, DO  cetirizine (ZYRTEC) 10 MG tablet Take 10 mg by mouth daily as needed for allergies.    [provider]  Fluticasone Furoate (ARNUITY ELLIPTA) 100 MCG/ACT AEPB Inhale 1 puff into the lungs daily. And rinse mouth after each use. 02/28/20   Ellamae Sia, DO  sodium chloride (OCEAN) 0.65 % SOLN nasal spray Place 1 spray into both nostrils as needed for congestion. 03/13/20   Couture, Cortni S, PA-C    Allergies    Patient has no known allergies.  Review of Systems   Review of Systems  Constitutional: Positive for fever. Negative for chills.  HENT: Negative for ear pain and sore throat.   Eyes: Negative for pain and visual disturbance.  Respiratory: Positive for cough, chest tightness and shortness of breath.   Cardiovascular: Negative for chest pain and palpitations.  Gastrointestinal: Negative for abdominal pain and vomiting.  Genitourinary: Negative for dysuria and hematuria.  Musculoskeletal: Negative for arthralgias and back pain.  Skin: Negative for color  change and rash.  Neurological: Negative for seizures and syncope.  All other systems reviewed and are negative.   Physical Exam Updated Vital Signs BP (!) 154/75 (BP Location: Left Arm)   Pulse (!) 126   Temp (!) 102.8 F (39.3 C) (Oral)   Resp 19   Ht 5\' 6"  (1.676 m)   Wt 77.1 kg   SpO2 95%   BMI 27.44 kg/m   Physical Exam Vitals and nursing note reviewed.  Constitutional:      General: He is in acute distress.     Appearance: He is well-developed. He is ill-appearing.  HENT:     Head: Normocephalic and atraumatic.     Nose: Nose  normal.     Mouth/Throat:     Mouth: Mucous membranes are moist.     Pharynx: Oropharynx is clear.  Eyes:     Extraocular Movements: Extraocular movements intact.     Conjunctiva/sclera: Conjunctivae normal.  Cardiovascular:     Rate and Rhythm: Regular rhythm. Tachycardia present.     Heart sounds: Normal heart sounds.  Pulmonary:     Effort: Respiratory distress present.     Breath sounds: Wheezing and rhonchi present.  Abdominal:     Palpations: Abdomen is soft.     Tenderness: There is no abdominal tenderness.  Musculoskeletal:     Cervical back: Neck supple.     Right lower leg: No edema.     Left lower leg: No edema.  Lymphadenopathy:     Cervical: No cervical adenopathy.  Skin:    General: Skin is warm and dry.     Capillary Refill: Capillary refill takes less than 2 seconds.  Neurological:     General: No focal deficit present.     Mental Status: He is alert and oriented to person, place, and time.  Psychiatric:        Mood and Affect: Mood normal.        Behavior: Behavior normal.     ED Results / Procedures / Treatments   Labs (all labs ordered are listed, but only abnormal results are displayed) Labs Reviewed - No data to display  EKG None  Radiology No results found.  Procedures Procedures   Medications Ordered in ED Medications - No data to display  ED Course  I have reviewed the triage vital signs and the nursing notes.  Pertinent labs & imaging results that were available during my care of the patient were reviewed by me and considered in my medical decision making (see chart for details).    MDM Rules/Calculators/A&P                          Patient is a 40 year old male who presents with shortness of breath and cough.  Presumed asthma exacerbation based on clinical presentation by EMS and therefore patient was treated with duo nebs, Solu-Medrol, IV magnesium, and IM epi prior to arrival to the ED.  On arrival to the emergency department  he is febrile, pressure 134/75, tachycardia with a heart rate of 126, saturating 95% on nonrebreather.  On physical exam, patient with bilateral wheezing and rhonchorous breath sounds.  He is in respiratory distress.  He is mildly diaphoretic, warm, but normal heart sounds.  Lower extremities without edema or tenderness.  No abdominal tenderness.  No rashes noted.  Patient's presentation initially treated with continuous albuterol, Atrovent, and Toradol for fever control.  CBC, CMP, and chest x-ray ordered to evaluate patient's  presentation as I am suspicious that patient possibly has pneumonia given fever and shortness of breath and not just a simple asthma exacerbation.  Chest x-ray confirms multifocal pneumonia.  At this time given patient's hemodynamic and physical exam, code sepsis initiated.  Fluids ordered.  Antibiotics for pneumonia ordered.  Lactic acid and cultures ordered.  Patient continues on albuterol & O2 support. Getting fluids and antibiotics now for concerns of sepsis. Awaiting labs to return for medicine admission for asthma exacerbation likely 2/2 to multifocal PNA. Patient care handed off to next provider. Please see their note for the rest of patient's workup.    Final Clinical Impression(s) / ED Diagnoses Final diagnoses:  None    Rx / DC Orders ED Discharge Orders    None       Kugler, [place], MD 09/10/20 Joseph Pierini    Eber Hong, MD 09/13/20 [phone]

## 2020-09-11 ENCOUNTER — Inpatient Hospital Stay (HOSPITAL_COMMUNITY): Payer: Commercial Managed Care - PPO

## 2020-09-11 ENCOUNTER — Encounter (HOSPITAL_COMMUNITY): Payer: Self-pay | Admitting: Internal Medicine

## 2020-09-11 DIAGNOSIS — J188 Other pneumonia, unspecified organism: Secondary | ICD-10-CM

## 2020-09-11 DIAGNOSIS — J159 Unspecified bacterial pneumonia: Secondary | ICD-10-CM | POA: Diagnosis present

## 2020-09-11 DIAGNOSIS — A419 Sepsis, unspecified organism: Secondary | ICD-10-CM

## 2020-09-11 DIAGNOSIS — A4189 Other specified sepsis: Secondary | ICD-10-CM | POA: Diagnosis present

## 2020-09-11 DIAGNOSIS — J189 Pneumonia, unspecified organism: Secondary | ICD-10-CM

## 2020-09-11 DIAGNOSIS — Z79899 Other long term (current) drug therapy: Secondary | ICD-10-CM | POA: Diagnosis not present

## 2020-09-11 DIAGNOSIS — E872 Acidosis: Secondary | ICD-10-CM | POA: Diagnosis present

## 2020-09-11 DIAGNOSIS — Z7951 Long term (current) use of inhaled steroids: Secondary | ICD-10-CM | POA: Diagnosis not present

## 2020-09-11 DIAGNOSIS — R0602 Shortness of breath: Secondary | ICD-10-CM | POA: Diagnosis present

## 2020-09-11 DIAGNOSIS — D721 Eosinophilia, unspecified: Secondary | ICD-10-CM | POA: Diagnosis present

## 2020-09-11 DIAGNOSIS — J45901 Unspecified asthma with (acute) exacerbation: Secondary | ICD-10-CM | POA: Diagnosis not present

## 2020-09-11 DIAGNOSIS — J339 Nasal polyp, unspecified: Secondary | ICD-10-CM | POA: Diagnosis present

## 2020-09-11 DIAGNOSIS — Z8701 Personal history of pneumonia (recurrent): Secondary | ICD-10-CM | POA: Diagnosis not present

## 2020-09-11 DIAGNOSIS — Z87891 Personal history of nicotine dependence: Secondary | ICD-10-CM | POA: Diagnosis not present

## 2020-09-11 DIAGNOSIS — J4531 Mild persistent asthma with (acute) exacerbation: Secondary | ICD-10-CM | POA: Diagnosis present

## 2020-09-11 DIAGNOSIS — U071 COVID-19: Secondary | ICD-10-CM | POA: Diagnosis present

## 2020-09-11 DIAGNOSIS — E876 Hypokalemia: Secondary | ICD-10-CM

## 2020-09-11 DIAGNOSIS — J9601 Acute respiratory failure with hypoxia: Secondary | ICD-10-CM | POA: Diagnosis present

## 2020-09-11 DIAGNOSIS — J324 Chronic pansinusitis: Secondary | ICD-10-CM | POA: Diagnosis present

## 2020-09-11 LAB — PROCALCITONIN: Procalcitonin: <0.1 ng/mL

## 2020-09-11 LAB — CBC WITH DIFFERENTIAL/PLATELET
Abs Immature Granulocytes: 0 10*3/uL (ref 0.00–0.07)
Basophils Absolute: 0 10*3/uL (ref 0.0–0.1)
Basophils Relative: 0 %
Eosinophils Absolute: 5.8 10*3/uL — ABNORMAL HIGH (ref 0.0–0.5)
Eosinophils Relative: 21 %
HCT: 43.5 % (ref 39.0–52.0)
Hemoglobin: 15.2 g/dL (ref 13.0–17.0)
Lymphocytes Relative: 5 %
Lymphs Abs: 1.4 10*3/uL (ref 0.7–4.0)
MCH: 30.5 pg (ref 26.0–34.0)
MCHC: 34.9 g/dL (ref 30.0–36.0)
MCV: 87.3 fL (ref 80.0–100.0)
Monocytes Absolute: 0.6 10*3/uL (ref 0.1–1.0)
Monocytes Relative: 2 %
Neutro Abs: 20 10*3/uL — ABNORMAL HIGH (ref 1.7–7.7)
Neutrophils Relative %: 72 %
Platelets: 510 10*3/uL — ABNORMAL HIGH (ref 150–400)
RBC: 4.98 MIL/uL (ref 4.22–5.81)
RDW: 12.3 % (ref 11.5–15.5)
WBC: 27.8 10*3/uL — ABNORMAL HIGH (ref 4.0–10.5)
nRBC: 0 % (ref 0.0–0.2)
nRBC: 0 /100 WBC

## 2020-09-11 LAB — MRSA PCR SCREENING: MRSA by PCR: NEGATIVE

## 2020-09-11 LAB — URINALYSIS, ROUTINE W REFLEX MICROSCOPIC
Bacteria, UA: NONE SEEN
Bilirubin Urine: NEGATIVE
Glucose, UA: >=500 mg/dL — AB
Hgb urine dipstick: NEGATIVE
Ketones, ur: 5 mg/dL — AB
Leukocytes,Ua: NEGATIVE
Nitrite: NEGATIVE
Protein, ur: NEGATIVE mg/dL
Specific Gravity, Urine: 1.028 (ref 1.005–1.030)
pH: 5 (ref 5.0–8.0)

## 2020-09-11 LAB — LACTIC ACID, PLASMA
Lactic Acid, Venous: 2.5 mmol/L (ref 0.5–1.9)
Lactic Acid, Venous: 4.9 mmol/L (ref 0.5–1.9)
Lactic Acid, Venous: 3.2 mmol/L (ref 0.5–1.9)

## 2020-09-11 LAB — COMPREHENSIVE METABOLIC PANEL
ALT: 21 U/L (ref 0–44)
AST: 18 U/L (ref 15–41)
Albumin: 3.2 g/dL — ABNORMAL LOW (ref 3.5–5.0)
Alkaline Phosphatase: 61 U/L (ref 38–126)
Anion gap: 12 (ref 5–15)
BUN: 11 mg/dL (ref 6–20)
CO2: 20 mmol/L — ABNORMAL LOW (ref 22–32)
Calcium: 8.2 mg/dL — ABNORMAL LOW (ref 8.9–10.3)
Chloride: 100 mmol/L (ref 98–111)
Creatinine, Ser: 1.06 mg/dL (ref 0.61–1.24)
GFR, Estimated: >60 mL/min (ref >60)
Glucose, Bld: 190 mg/dL — ABNORMAL HIGH (ref 70–99)
Potassium: 3.1 mmol/L — ABNORMAL LOW (ref 3.5–5.1)
Sodium: 132 mmol/L — ABNORMAL LOW (ref 135–145)
Total Bilirubin: 1.5 mg/dL — ABNORMAL HIGH (ref 0.3–1.2)
Total Protein: 6.7 g/dL (ref 6.5–8.1)

## 2020-09-11 LAB — CBC
HCT: 37.6 % — ABNORMAL LOW (ref 39.0–52.0)
Hemoglobin: 13 g/dL (ref 13.0–17.0)
MCH: 30.1 pg (ref 26.0–34.0)
MCHC: 34.6 g/dL (ref 30.0–36.0)
MCV: 87 fL (ref 80.0–100.0)
Platelets: 430 10*3/uL — ABNORMAL HIGH (ref 150–400)
RBC: 4.32 MIL/uL (ref 4.22–5.81)
RDW: 12.3 % (ref 11.5–15.5)
WBC: 11.7 10*3/uL — ABNORMAL HIGH (ref 4.0–10.5)
nRBC: 0 % (ref 0.0–0.2)

## 2020-09-11 LAB — RESP PANEL BY RT-PCR (FLU A&B, COVID) ARPGX2
Influenza A by PCR: NEGATIVE
Influenza B by PCR: NEGATIVE
SARS Coronavirus 2 by RT PCR: POSITIVE — AB

## 2020-09-11 LAB — C-REACTIVE PROTEIN: CRP: 4.7 mg/dL — ABNORMAL HIGH (ref <1.0)

## 2020-09-11 LAB — BASIC METABOLIC PANEL
Anion gap: 10 (ref 5–15)
BUN: 9 mg/dL (ref 6–20)
CO2: 21 mmol/L — ABNORMAL LOW (ref 22–32)
Calcium: 8.1 mg/dL — ABNORMAL LOW (ref 8.9–10.3)
Chloride: 104 mmol/L (ref 98–111)
Creatinine, Ser: 0.91 mg/dL (ref 0.61–1.24)
GFR, Estimated: >60 mL/min (ref >60)
Glucose, Bld: 175 mg/dL — ABNORMAL HIGH (ref 70–99)
Potassium: 3.5 mmol/L (ref 3.5–5.1)
Sodium: 135 mmol/L (ref 135–145)

## 2020-09-11 LAB — D-DIMER, QUANTITATIVE: D-Dimer, Quant: 2.11 ug/mL-FEU — ABNORMAL HIGH (ref 0.00–0.50)

## 2020-09-11 LAB — MAGNESIUM: Magnesium: 2.1 mg/dL (ref 1.7–2.4)

## 2020-09-11 LAB — STREP PNEUMONIAE URINARY ANTIGEN: Strep Pneumo Urinary Antigen: NEGATIVE

## 2020-09-11 LAB — BRAIN NATRIURETIC PEPTIDE: B Natriuretic Peptide: 44.4 pg/mL (ref 0.0–100.0)

## 2020-09-11 LAB — HIV ANTIBODY (ROUTINE TESTING W REFLEX): HIV Screen 4th Generation wRfx: NONREACTIVE

## 2020-09-11 MED ORDER — ORAL CARE MOUTH RINSE
15.0000 mL | Freq: Two times a day (BID) | OROMUCOSAL | Status: DC
  Administered 2020-09-11 – 2020-09-14 (×4): 15 mL via OROMUCOSAL

## 2020-09-11 MED ORDER — ALBUTEROL SULFATE (2.5 MG/3ML) 0.083% IN NEBU: 2.5000 mg | INHALATION_SOLUTION | RESPIRATORY_TRACT | Status: DC | PRN

## 2020-09-11 MED ORDER — METHYLPREDNISOLONE SODIUM SUCC 125 MG IJ SOLR
60.0000 mg | Freq: Four times a day (QID) | INTRAMUSCULAR | Status: DC
  Administered 2020-09-11: 60 mg via INTRAVENOUS
  Filled 2020-09-11: qty 2

## 2020-09-11 MED ORDER — ACETAMINOPHEN 325 MG PO TABS: 650.0000 mg | ORAL_TABLET | Freq: Four times a day (QID) | ORAL | Status: DC | PRN

## 2020-09-11 MED ORDER — AZELASTINE-FLUTICASONE 137-50 MCG/ACT NA SUSP: 1.0000 | Freq: Two times a day (BID) | NASAL | Status: DC | PRN

## 2020-09-11 MED ORDER — IPRATROPIUM-ALBUTEROL 0.5-2.5 (3) MG/3ML IN SOLN
3.0000 mL | Freq: Four times a day (QID) | RESPIRATORY_TRACT | Status: DC
  Administered 2020-09-11: 3 mL via RESPIRATORY_TRACT
  Filled 2020-09-11: qty 3

## 2020-09-11 MED ORDER — POTASSIUM CHLORIDE CRYS ER 20 MEQ PO TBCR
40.0000 meq | EXTENDED_RELEASE_TABLET | Freq: Once | ORAL | Status: AC
  Administered 2020-09-11: 40 meq via ORAL
  Filled 2020-09-11: qty 2

## 2020-09-11 MED ORDER — BUDESONIDE 0.5 MG/2ML IN SUSP: 2.0000 mL | RESPIRATORY_TRACT | Status: DC

## 2020-09-11 MED ORDER — DM-GUAIFENESIN ER 30-600 MG PO TB12
1.0000 | ORAL_TABLET | Freq: Two times a day (BID) | ORAL | Status: DC | PRN
  Administered 2020-09-11 – 2020-09-12 (×2): 1 via ORAL
  Filled 2020-09-11 (×2): qty 1

## 2020-09-11 MED ORDER — FLUTICASONE PROPIONATE 50 MCG/ACT NA SUSP: 1.0000 | Freq: Two times a day (BID) | NASAL | Status: DC | PRN

## 2020-09-11 MED ORDER — ENOXAPARIN SODIUM 40 MG/0.4ML ~~LOC~~ SOLN
40.0000 mg | SUBCUTANEOUS | Status: DC
  Filled 2020-09-11 (×2): qty 0.4

## 2020-09-11 MED ORDER — SODIUM CHLORIDE 0.9 % IV BOLUS
500.0000 mL | Freq: Once | INTRAVENOUS | Status: AC
  Administered 2020-09-11: 500 mL via INTRAVENOUS

## 2020-09-11 MED ORDER — LEVOFLOXACIN 750 MG PO TABS
750.0000 mg | ORAL_TABLET | Freq: Every day | ORAL | Status: DC
  Administered 2020-09-11 – 2020-09-12 (×2): 750 mg via ORAL
  Filled 2020-09-11 (×2): qty 1

## 2020-09-11 MED ORDER — AZELASTINE HCL 0.1 % NA SOLN: 1.0000 | Freq: Two times a day (BID) | NASAL | Status: DC | PRN

## 2020-09-11 MED ORDER — ALBUTEROL SULFATE HFA 108 (90 BASE) MCG/ACT IN AERS
2.0000 | INHALATION_SPRAY | RESPIRATORY_TRACT | Status: DC | PRN
  Administered 2020-09-11 – 2020-09-12 (×3): 2 via RESPIRATORY_TRACT

## 2020-09-11 MED ORDER — SODIUM CHLORIDE 0.9 % IV BOLUS: 1000.0000 mL | Freq: Once | INTRAVENOUS | Status: DC

## 2020-09-11 MED ORDER — LORATADINE 10 MG PO TABS
10.0000 mg | ORAL_TABLET | Freq: Every day | ORAL | Status: DC
  Administered 2020-09-11 – 2020-09-14 (×4): 10 mg via ORAL
  Filled 2020-09-11 (×4): qty 1

## 2020-09-11 MED ORDER — POTASSIUM CHLORIDE CRYS ER 20 MEQ PO TBCR
20.0000 meq | EXTENDED_RELEASE_TABLET | Freq: Once | ORAL | Status: AC
  Administered 2020-09-11: 20 meq via ORAL
  Filled 2020-09-11: qty 1

## 2020-09-11 MED ORDER — ALBUTEROL SULFATE HFA 108 (90 BASE) MCG/ACT IN AERS
2.0000 | INHALATION_SPRAY | Freq: Four times a day (QID) | RESPIRATORY_TRACT | Status: DC
  Administered 2020-09-11 – 2020-09-12 (×4): 2 via RESPIRATORY_TRACT
  Filled 2020-09-11: qty 6.7

## 2020-09-11 MED ORDER — ACETAMINOPHEN 650 MG RE SUPP: 650.0000 mg | Freq: Four times a day (QID) | RECTAL | Status: DC | PRN

## 2020-09-11 MED ORDER — METHYLPREDNISOLONE SODIUM SUCC 125 MG IJ SOLR
60.0000 mg | Freq: Two times a day (BID) | INTRAMUSCULAR | Status: DC
  Administered 2020-09-11 – 2020-09-14 (×6): 60 mg via INTRAVENOUS
  Filled 2020-09-11 (×6): qty 2

## 2020-09-11 MED ORDER — MELATONIN 3 MG PO TABS
3.0000 mg | ORAL_TABLET | Freq: Every day | ORAL | Status: DC
  Administered 2020-09-11 – 2020-09-13 (×3): 3 mg via ORAL
  Filled 2020-09-11 (×3): qty 1

## 2020-09-11 NOTE — ED Notes (Signed)
[facility]  coverage paged about patients lactic of 4.9

## 2020-09-11 NOTE — H&P (Signed)
History and Physical    Tracy Pugh EMV:[id number] DOB: 1981-03-27 DOA: 09/10/2020  PCP: Patient, No Pcp Per Patient coming from: Home  Chief Complaint: Shortness of breath  HPI: Tracy Pugh is a 40 y.o. male with medical history significant of mild persistent asthma, allergic rhinitis, chronic pansinusitis with nasal polyposis followed by ENT at [facility] presenting to the ED via EMS for respiratory distress.  When EMS arrived patient was wheezing and unable to speak in full sentences.  EMS administered albuterol, Atrovent, epinephrine, IV Solu-Medrol 125 mg, and IV magnesium 2 g.  He was still wheezing and satting 88% on room air.  Nonrebreather placed and he was brought to the ED to be evaluated.  Patient reports progressively worsening dyspnea, wheezing, and cough productive of yellow sputum.  His breathing has been much worse since yesterday.  States he was seen at urgent care on 3/9 and prescribed a 5-day course of prednisone and azithromycin which helped initially but then his symptoms started again.  He uses albuterol and Arnuity for his asthma and reports compliance.  States he was sick at the end of January with fevers and headache and had a Covid test done at his workplace on 07/23/2020 which was positive.  States he fully recovered from his Covid infection in about 2 to 3 weeks but then started having breathing problem again this month.  He has a pulse oximeter and has been checking his oxygen saturation at home which has been around 90%.  ED Course: Satting 95% on 15 L via nonrebreather on arrival.  Febrile with temperature 102.8 F.  Tachycardic and tachypneic.  Not hypotensive.  Labs showing WBC 27.8 (absolute neutrophil and eosinophil count elevated), hemoglobin 15.2, platelet count 510K.  Sodium 132, potassium 3.1, chloride 100, bicarb 20, BUN 11, creatinine 1.0, glucose 190.  Lactic acid level pending.  SARS-CoV-2 PCR test positive.  Influenza panel negative.  Blood culture  pending.  Chest x-ray showing findings consistent with multifocal pneumonia. Patient was continuous albuterol neb treatment, Atrovent, ceftriaxone, azithromycin, and 30 cc/Kg fluid boluses per sepsis protocol. Initially required nonrebreather but after treatments, titrated down to 4 L supplemental oxygen via nasal cannula.  Review of Systems:  All systems reviewed and apart from history of presenting illness, are negative.  Past Medical History:  Diagnosis Date  . Asthma     No past surgical history on file.   reports that he quit smoking about 3 years ago. He has never used smokeless tobacco. He reports current alcohol use. He reports that he does not use drugs.  No Known Allergies  History reviewed. No pertinent family history.  Prior to Admission medications   Medication Sig Start Date End Date Taking? Authorizing Provider  albuterol (VENTOLIN HFA) 108 (90 Base) MCG/ACT inhaler Inhale 1-2 puffs into the lungs every 6 (six) hours as needed for wheezing or shortness of breath. 12/07/19  Yes [provider]  Azelastine-Fluticasone 137-50 MCG/ACT SUSP Place 1 spray into the nose in the morning and at bedtime. Patient taking differently: Place 1 spray into the nose 2 (two) times daily as needed (sinus issues). 02/28/20  Yes Ellamae Sia, DO  budesonide (PULMICORT) 0.5 MG/2ML nebulizer solution 2 mLs See admin instructions. Mix in distilled water and spray into nose twice daily 07/12/20  Yes [provider]  cetirizine (ZYRTEC) 10 MG tablet Take 10 mg by mouth daily as needed for allergies.   Yes [provider]  dextromethorphan-guaiFENesin (MUCINEX DM) 30-600 MG 12hr tablet Take 1 tablet  by mouth 2 (two) times daily as needed for cough.   Yes [provider]  Fluticasone Furoate (ARNUITY ELLIPTA) 100 MCG/ACT AEPB Inhale 1 puff into the lungs daily. And rinse mouth after each use. 02/28/20  Yes Ellamae Sia, DO  ibuprofen (ADVIL) 200 MG tablet Take 400 mg by mouth  every 6 (six) hours as needed for headache or moderate pain.   Yes [provider]  sodium chloride (OCEAN) 0.65 % SOLN nasal spray Place 1 spray into both nostrils as needed for congestion. 03/13/20  Yes Couture, Cortni S, PA-C    Physical Exam: Vitals:   09/11/20 0130 09/11/20 0145 09/11/20 0215 09/11/20 0230  BP: 115/62 98/65 117/64 (!) 114/57  Pulse: (!) 110 (!) 108 (!) 102 98  Resp: 20 (!) 28 (!) 35 (!) 31  Temp:      TempSrc:      SpO2: 90% 91% 94% 95%  Weight:      Height:        Physical Exam Constitutional:      Appearance: He is ill-appearing.  HENT:     Head: Normocephalic and atraumatic.  Eyes:     Extraocular Movements: Extraocular movements intact.     Conjunctiva/sclera: Conjunctivae normal.  Cardiovascular:     Rate and Rhythm: Regular rhythm. Tachycardia present.     Pulses: Normal pulses.     Comments: Slightly tachycardic Pulmonary:     Effort: Pulmonary effort is normal.     Breath sounds: Wheezing present.  Abdominal:     General: Bowel sounds are normal. There is no distension.     Palpations: Abdomen is soft.     Tenderness: There is no abdominal tenderness.  Musculoskeletal:        General: No swelling or tenderness.     Cervical back: Normal range of motion and neck supple.  Skin:    General: Skin is warm and dry.  Neurological:     General: No focal deficit present.     Mental Status: He is alert and oriented to person, place, and time.     Labs on Admission: I have personally reviewed following labs and imaging studies  CBC: Recent Labs  Lab 09/10/20 2327  WBC 27.8*  NEUTROABS 20.0*  HGB 15.2  HCT 43.5  MCV 87.3  PLT 510*   Basic Metabolic Panel: Recent Labs  Lab 09/10/20 2327  NA 132*  K 3.1*  CL 100  CO2 20*  GLUCOSE 190*  BUN 11  CREATININE 1.06  CALCIUM 8.2*   GFR: Estimated Creatinine Clearance: 93.3 mL/min (by C-G formula based on SCr of 1.06 mg/dL). Liver Function Tests: Recent Labs  Lab  09/10/20 2327  AST 18  ALT 21  ALKPHOS 61  BILITOT 1.5*  PROT 6.7  ALBUMIN 3.2*   No results for input(s): LIPASE, AMYLASE in the last 168 hours. No results for input(s): AMMONIA in the last 168 hours. Coagulation Profile: No results for input(s): INR, PROTIME in the last 168 hours. Cardiac Enzymes: No results for input(s): CKTOTAL, CKMB, CKMBINDEX, TROPONINI in the last 168 hours. BNP (last 3 results) No results for input(s): PROBNP in the last 8760 hours. HbA1C: No results for input(s): HGBA1C in the last 72 hours. CBG: No results for input(s): GLUCAP in the last 168 hours. Lipid Profile: No results for input(s): CHOL, HDL, LDLCALC, TRIG, CHOLHDL, LDLDIRECT in the last 72 hours. Thyroid Function Tests: No results for input(s): TSH, T4TOTAL, FREET4, T3FREE, THYROIDAB in the last 72 hours. Anemia  Panel: No results for input(s): VITAMINB12, FOLATE, FERRITIN, TIBC, IRON, RETICCTPCT in the last 72 hours. Urine analysis: No results found for: COLORURINE, APPEARANCEUR, LABSPEC, PHURINE, GLUCOSEU, HGBUR, BILIRUBINUR, KETONESUR, PROTEINUR, UROBILINOGEN, NITRITE, LEUKOCYTESUR  Radiological Exams on Admission: DG Chest Portable 1 View  Result Date: 09/10/2020 CLINICAL DATA:  Shortness of breath, fever EXAM: PORTABLE CHEST 1 VIEW COMPARISON:  None. FINDINGS: Areas of consolidation in the right upper lobe and right lower lobe. Probable early infiltrates in the left upper lobe. Heart is normal size. No effusions or acute bony abnormality. IMPRESSION: Multifocal airspace opacities bilaterally, right greater than left concerning for multifocal pneumonia. Electronically Signed   By: Charlett Nose M.D.   On: 09/10/2020 22:06    EKG: No EKG done in the ED.  EKG has been ordered now and currently pending.  Assessment/Plan Principal Problem:   Multifocal pneumonia Active Problems:   Acute hypoxemic respiratory failure (HCC)   Sepsis (HCC)   Asthma exacerbation   Hypokalemia   Sepsis  secondary to multifocal pneumonia: Meets criteria for sepsis with fever, tachycardia, tachypnea, and significant leukocytosis.  Not hypotensive.  Chest x-ray showing findings consistent with multifocal pneumonia.  Influenza panel negative.  SARS-CoV-2 PCR test positive but patient tells me that he tested positive for Covid less than 2 months ago on 07/23/2020 and had fully recovered from his illness at that time.  He is no longer endorsing any other symptoms consistent with Covid infection.  Given significant leukocytosis, pneumonia is likely due to bacterial infection. -Continue ceftriaxone and azithromycin.  Tylenol as needed for fevers.  Patient was given 30 cc/kg fluid boluses per sepsis protocol, continue maintenance IV fluid.  Lactic acid level pending.  Check procalcitonin level.  Continue to monitor WBC count.  Only one set of blood culture could be drawn in the ED and currently pending.  Acute asthma exacerbation: Received bronchodilator treatments including hour-long continuous albuterol neb, IV Solu-Medrol, IV magnesium 2 g, and epinephrine.  His work of breathing has improved significantly but continues to have wheezing. -Solu-Medrol 60 mg every 6 hours, DuoNeb every 6 hours, Pulmicort nebs twice daily, albuterol nebulizer as needed, and Mucinex DM for cough.  Acute hypoxemic respiratory failure: Multifactorial from multifocal pneumonia and asthma exacerbation.  Satting 88% on room air with EMS and initially required nonrebreather.  Currently satting well on 4 L supplemental oxygen. -Continuous pulse ox.  Continue supplemental oxygen, wean as tolerated.  Mild hypokalemia -Monitor potassium and magnesium levels, replenish as needed.  Allergic rhinitis Chronic pansinusitis with nasal polyposis -Continue budesonide irrigations, azelastine-fluticasone nasal spray, and oral antihistamine.  DVT prophylaxis: Lovenox Code Status: Full code Family Communication: No family available at this time.   Diagnostic findings and treatment plan discussed with the patient in detail. Disposition Plan: Status is: Inpatient  Remains inpatient appropriate because:Inpatient level of care appropriate due to severity of illness   Dispo: The patient is from: Home              Anticipated d/c is to: Home              Patient currently is not medically stable to d/c.   Difficult to place patient No  Level of care: Level of care: Progressive   The medical decision making on this patient was of high complexity and the patient is at high risk for clinical deterioration, therefore this is a level 3 visit.  John Giovanni MD [facility]  If 7PM-7AM, please contact night-coverage [email or web address]  09/11/2020, 3:05 AM

## 2020-09-11 NOTE — Progress Notes (Signed)
Pt arrived on unit. AOx4. VSS. On 2L [place]. Belongings charted in flowsheet. Call bell placed within reach. Pt able to verbalize understanding of use. Will continue to monitor.

## 2020-09-11 NOTE — ED Provider Notes (Signed)
40 year old male received at signout from Dr. Girard Cooter, ER resident, under the supervision of Dr. Hyacinth Meeker pending labs and admission. Per her HPI:   "Tracy Pugh is a 40 y.o. male.  Patient is a 40 year old male with a medical history of asthma who presents with shortness of breath.  Patient states that he became acutely short of breath this afternoon.  He was doing stuff at work when he started coughing and feeling like he was wheezing.  Patient sat down and took his albuterol inhaler.  This did not relieve his symptoms.  Patient called his wife who took him to the fire station.  Patient was transported to the hospital via EMS.  With them he received DuoNeb's, magnesium, Solu-Medrol, and IM epi.  Patient symptoms only mildly improved.  Patient arrives on nonrebreather.  He reports shortness of breath.  He denies chest pain.  He denies lower extremity swelling or pain.  He does report cough.  Patient states that he was diagnosed with a pneumonia a few weeks back and was on azithromycin and steroids.  Initially improved his symptoms.  Patient also with Covid back in January.  Denies fevers and chills at home.  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  He had no complications related to the procedure.  He tested positive for COVID-19 at the end of January.  He is unvaccinated against Covid.  His symptoms related to his COVID-19 infection completely resolved by the end of the first week of February.    However, approximately 3 weeks ago the patient began feeling shortness of breath accompanied by cough, wheezing, diarrhea and was seen at urgent care and diagnosed with pneumonia.  Since his diagnosis, he reports shortness of breath, worse at night, and wheezing.  Symptoms have been worsening since onset.  1 day earlier this week, he noticed that his oxygen saturation was  84-85 at night, but he did not seek medical attention.  Over the last few days, his oxygen saturation at home has been around 90-91 until his symptoms worsened today and he went to the fire station he was found to have oxygen saturation of 88% on room air.   Per chart review on OP visit to ENT on 2/17 Patient with a history of "nasal polyposis, nasal obstruction, nasal septal deviation, inferior turbinate hypertrophy, right maxillary sinus mass, left concha bullosa, and allergic rhinitis. He bilateral endoscopic sinus surgery with nasal polypectomy, septoplasty, inferior turbinate reduction, and excision of left concha bullosa on 06/18/20. He was last seen on 07/12/20. At that time he elected to proceed with Prednisone taper, Budesonide irrigations, and continued Dymista."    Physical Exam  BP 98/65   Pulse (!) 108   Temp (!) 102.8 F (39.3 C) (Oral)   Resp (!) 28   Ht 5\' 6"  (1.676 m)   Wt 77.1 kg   SpO2 91%   BMI 27.44 kg/m   Physical Exam Vitals and nursing note reviewed.  Constitutional:      Appearance: He is well-developed. He is ill-appearing.     Comments: Nasal cannula in place.  HENT:     Head: Normocephalic.  Eyes:     Conjunctiva/sclera: Conjunctivae normal.  Cardiovascular:     Rate and Rhythm: Regular rhythm. Tachycardia present.     Heart sounds: No murmur heard.   Pulmonary:     Effort: Pulmonary effort is normal. No  respiratory distress.     Breath sounds: No stridor. Wheezing present. No rhonchi or rales.     Comments: Good air movement with inspiratory and expiratory wheezes bilaterally in all fields. Abdominal:     General: There is no distension.     Palpations: Abdomen is soft.  Musculoskeletal:     Cervical back: Neck supple.  Skin:    General: Skin is warm and dry.  Neurological:     Mental Status: He is alert.  Psychiatric:        Behavior: Behavior normal.     ED Course/Procedures     .Critical Care Performed by: Barkley Boards,  PA-C Authorized by: Barkley Boards, PA-C   Critical care provider statement:    Critical care time (minutes):  30   Critical care time was exclusive of:  Separately billable procedures and treating other patients and teaching time   Critical care was necessary to treat or prevent imminent or life-threatening deterioration of the following conditions:  Sepsis   Critical care was time spent personally by me on the following activities:  Ordering and review of laboratory studies, pulse oximetry, re-evaluation of patient's condition, obtaining history from patient or surrogate, examination of patient, evaluation of patient's response to treatment and review of old charts   I assumed direction of critical care for this patient from another provider in my specialty: yes      MDM   40 year old male received at signout from Dr Girard Cooter, ER resident, working under the supervision of Dr. Hyacinth Meeker.  Please see her note for further work-up and medical decision making.  Patient was febrile, tachycardic, and tachypneic on arrival.  Hypoxic prior to arrival on room air, but patient was transported on nonrebreather.  Labs have been reviewed and independently interpreted by me.  He has a leukocytosis of 27.8 I suspect this is partially due to a was constant corticosteroid usage since June 2021 also in the setting of infection.  Notably, patient also has eosinophilia with an absolute neutrophil count of 5.8.  Patient's COVID-19 test is positive, PCR test is likely still positive from Jan. 31 when he tested positive for Covid at an outside facility, but can consider Covid reinfection from another variant.   Differential diagnosis could also include multifocal bacterial pneumonia, status asthmaticus, idiopathic acute eosinophilic pneumonia, eophilic granulomatosis with polyangitis, fungal infection given prolonged corticosteroid use.   Since patient did meet sepsis criteria, blood cultures and antibiotics were  administered prior to my assuming care of the patient.   After signout, patient was seen and evaluated by me and was noted to have wheezing with inspiration and expiration in all fields.  No rhonchi or rales.  I was able to decrease his nasal cannula from 5 L to 4 L and he was maintaining oxygen saturation of 95%.  The patient is critically ill and will require admission.  Consult to the hospitalist service and Dr. Loney Loh will accept the patient for admission.  The patient appears reasonably stabilized for admission considering the current resources, flow, and capabilities available in the ED at this time, and I doubt any other [facility] requiring further screening and/or treatment in the ED prior to admission.     Barkley Boards, PA-C 09/11/20 0202    Shon Baton, MD 09/11/20 [phone]

## 2020-09-11 NOTE — Progress Notes (Signed)
Notified provider of need to order lactic acid. ° °

## 2020-09-11 NOTE — ED Notes (Signed)
Report attempted 

## 2020-09-11 NOTE — ED Notes (Signed)
[facility], returned page for critical lactic, informed to draw another lactic

## 2020-09-11 NOTE — ED Notes (Signed)
Called lab to have strep pneumoniae urinary antigen and legionella pneumophilia added to urinalysis previously sent down.  Lab stated they would run the tests.

## 2020-09-11 NOTE — Progress Notes (Signed)
RT has changed aerosol tx's to inhalers based off +covid result.

## 2020-09-11 NOTE — Plan of Care (Signed)
?  Problem: Education: ?Goal: Knowledge of General Education information will improve ?Description: Including pain rating scale, medication(s)/side effects and non-pharmacologic comfort measures ?Outcome: Progressing ?  ?Problem: Health Behavior/Discharge Planning: ?Goal: Ability to manage health-related needs will improve ?Outcome: Progressing ?  ?Problem: Clinical Measurements: ?Goal: Ability to maintain clinical measurements within normal limits will improve ?Outcome: Progressing ?Goal: Will remain free from infection ?Outcome: Progressing ?Goal: Diagnostic test results will improve ?Outcome: Progressing ?Goal: Respiratory complications will improve ?Outcome: Progressing ?Goal: Cardiovascular complication will be avoided ?Outcome: Progressing ?  ?Problem: Activity: ?Goal: Risk for activity intolerance will decrease ?Outcome: Progressing ?  ?Problem: Safety: ?Goal: Ability to remain free from injury will improve ?Outcome: Progressing ?  ?

## 2020-09-11 NOTE — Progress Notes (Signed)
Sepsis tracking by eLINK 

## 2020-09-11 NOTE — Progress Notes (Signed)
Tracy Pugh, is a 40 y.o. male, DOB - Oct 23, 1980, JSH:[id number]   Admitted few hours ago was diagnosed with Covid infection last week of January 2022, results uploaded into epic from his work clinic.  Now presents with cough and shortness of breath, in the ER work-up consistent with atypical bacterial pneumonia with leukocytosis, he is still Covid positive but I think this does not reflect an active infection.  He probably has not cleared his recent previous infection completely.  Placed him on Levaquin along with supportive measures.  Monitor closely.  Clinically already improving.  Vitals:   09/11/20 0730 09/11/20 0830 09/11/20 0900 09/11/20 0930  BP: (!) 102/57 103/73 (!) 115/55 135/79  Pulse: 83 87 88 89  Resp: (!) 30 (!) 30 (!) 24 (!) 23  Temp:      TempSrc:      SpO2: 97% 94% 96% 96%  Weight:      Height:            Data Review   Micro Results Recent Results (from the past 240 hour(s))  Resp Panel by RT-PCR (Flu A&B, Covid) Nasopharyngeal Swab     Status: Abnormal   Collection Time: 09/10/20 11:27 PM   Specimen: Nasopharyngeal Swab; Nasopharyngeal(NP) swabs in vial transport medium  Result Value Ref Range Status   SARS Coronavirus 2 by RT PCR POSITIVE (A) NEGATIVE Final    Comment: RESULT CALLED TO, READ BACK BY AND VERIFIED WITH: J NEWTON RN 09/11/20 0030 JDW (NOTE) SARS-CoV-2 target nucleic acids are DETECTED.  The SARS-CoV-2 RNA is generally detectable in upper respiratory specimens during the acute phase of infection. Positive results are indicative of the presence of the identified virus, but do not rule out bacterial infection or co-infection with other pathogens not detected by the test. Clinical correlation with patient history and other diagnostic information is necessary to determine patient infection status. The expected result is Negative.  Fact Sheet for  Patients: BloggerCourse.com  Fact Sheet for Healthcare Providers: SeriousBroker.it  This test is not yet approved or cleared by the [place] FDA and  has been authorized for detection and/or diagnosis of SARS-CoV-2 by FDA under an Emergency Use Authorization (EUA).  This EUA will remain in effect (meaning this test can be used ) for the duration of  the COVID-19 declaration under Section 564(b)(1) of the Act, 21 U.S.C. section 360bbb-3(b)(1), unless the authorization is terminated or revoked sooner.     Influenza A by PCR NEGATIVE NEGATIVE Final   Influenza B by PCR NEGATIVE NEGATIVE Final    Comment: (NOTE) The Xpert Xpress SARS-CoV-2/FLU/RSV plus assay is intended as an aid in the diagnosis of influenza from Nasopharyngeal swab specimens and should not be used as a sole basis for treatment. Nasal washings and aspirates are unacceptable for Xpert Xpress SARS-CoV-2/FLU/RSV testing.  Fact Sheet for Patients: BloggerCourse.com  Fact Sheet for Healthcare Providers: SeriousBroker.it  This test is not yet approved or cleared by the [place] FDA and has been authorized for detection and/or diagnosis of SARS-CoV-2 by FDA under an Emergency Use Authorization (EUA). This EUA will remain in effect (meaning this test can be used) for the duration of the COVID-19 declaration under Section 564(b)(1) of the Act, 21 U.S.C. section 360bbb-3(b)(1), unless the authorization is terminated or revoked.  Performed at [facility], [address], [place], [place] [address]   Culture, blood (single)     Status: None (Preliminary result)   Collection Time: 09/10/20 11:27 PM  Specimen: BLOOD RIGHT FOREARM  Result Value Ref Range Status   Specimen Description BLOOD RIGHT FOREARM  Final   Special Requests   Final    BOTTLES DRAWN AEROBIC AND ANAEROBIC Blood Culture results may not be  optimal due to an inadequate volume of blood received in culture bottles   Culture   Final    NO GROWTH < 12 HOURS Performed at [facility], [address], [place], [place] [address]    Report Status PENDING  Incomplete    Radiology Reports DG Chest Port 1 View  Result Date: 09/11/2020 CLINICAL DATA:  Shortness of breath. EXAM: PORTABLE CHEST 1 VIEW COMPARISON:  Chest x-ray 09/10/2020. FINDINGS: Multifocal bilateral pulmonary infiltrates most prominent in the right upper and right lower lung again noted. Similar findings noted on prior exam. No pleural effusion or pneumothorax. Heart size normal. Degenerative change thoracic spine. IMPRESSION: Multifocal bilateral pulmonary infiltrates most prominent in the right upper and right lower lung again noted. Similar findings noted on prior exam. Electronically Signed   By: Maisie Fus  Register   On: 09/11/2020 07:46   DG Chest Portable 1 View  Result Date: 09/10/2020 CLINICAL DATA:  Shortness of breath, fever EXAM: PORTABLE CHEST 1 VIEW COMPARISON:  None. FINDINGS: Areas of consolidation in the right upper lobe and right lower lobe. Probable early infiltrates in the left upper lobe. Heart is normal size. No effusions or acute bony abnormality. IMPRESSION: Multifocal airspace opacities bilaterally, right greater than left concerning for multifocal pneumonia. Electronically Signed   By: Charlett Nose M.D.   On: 09/10/2020 22:06    CBC Recent Labs  Lab 09/10/20 2327 09/11/20 0444  WBC 27.8* 11.7*  HGB 15.2 13.0  HCT 43.5 37.6*  PLT 510* 430*  MCV 87.3 87.0  MCH 30.5 30.1  MCHC 34.9 34.6  RDW 12.3 12.3  LYMPHSABS 1.4  --   MONOABS 0.6  --   EOSABS 5.8*  --   BASOSABS 0.0  --     Chemistries  Recent Labs  Lab 09/10/20 2327 09/11/20 0444  NA 132* 135  K 3.1* 3.5  CL 100 104  CO2 20* 21*  GLUCOSE 190* 175*  BUN 11 9  CREATININE 1.06 0.91  CALCIUM 8.2* 8.1*  MG  --  2.1  AST 18  --   ALT 21  --   ALKPHOS 61  --   BILITOT  1.5*  --    ------------------------------------------------------------------------------------------------------------------ estimated creatinine clearance is 108.6 mL/min (by C-G formula based on SCr of 0.91 mg/dL). ------------------------------------------------------------------------------------------------------------------ No results for input(s): HGBA1C in the last 72 hours. ------------------------------------------------------------------------------------------------------------------ No results for input(s): CHOL, HDL, LDLCALC, TRIG, CHOLHDL, LDLDIRECT in the last 72 hours. ------------------------------------------------------------------------------------------------------------------ No results for input(s): TSH, T4TOTAL, T3FREE, THYROIDAB in the last 72 hours.  Invalid input(s): FREET3 ------------------------------------------------------------------------------------------------------------------ No results for input(s): VITAMINB12, FOLATE, FERRITIN, TIBC, IRON, RETICCTPCT in the last 72 hours.  Coagulation profile No results for input(s): INR, PROTIME in the last 168 hours.  Recent Labs    09/11/20 0759  DDIMER 2.11*    Cardiac Enzymes No results for input(s): CKMB, TROPONINI, MYOGLOBIN in the last 168 hours.  Invalid input(s): CK ------------------------------------------------------------------------------------------------------------------ Invalid input(s): POCBNP   Signature  Susa Raring M.D on 09/11/2020 at 12:21 PM   -  To page go to [email or web address]

## 2020-09-12 DIAGNOSIS — J9601 Acute respiratory failure with hypoxia: Secondary | ICD-10-CM

## 2020-09-12 DIAGNOSIS — J45901 Unspecified asthma with (acute) exacerbation: Secondary | ICD-10-CM

## 2020-09-12 LAB — COMPREHENSIVE METABOLIC PANEL
ALT: 25 U/L (ref 0–44)
AST: 17 U/L (ref 15–41)
Albumin: 2.6 g/dL — ABNORMAL LOW (ref 3.5–5.0)
Alkaline Phosphatase: 48 U/L (ref 38–126)
Anion gap: 5 (ref 5–15)
BUN: 10 mg/dL (ref 6–20)
CO2: 25 mmol/L (ref 22–32)
Calcium: 8.5 mg/dL — ABNORMAL LOW (ref 8.9–10.3)
Chloride: 108 mmol/L (ref 98–111)
Creatinine, Ser: 0.78 mg/dL (ref 0.61–1.24)
GFR, Estimated: >60 mL/min (ref >60)
Glucose, Bld: 132 mg/dL — ABNORMAL HIGH (ref 70–99)
Potassium: 4.4 mmol/L (ref 3.5–5.1)
Sodium: 138 mmol/L (ref 135–145)
Total Bilirubin: 0.5 mg/dL (ref 0.3–1.2)
Total Protein: 5.4 g/dL — ABNORMAL LOW (ref 6.5–8.1)

## 2020-09-12 LAB — CBC WITH DIFFERENTIAL/PLATELET
Abs Immature Granulocytes: 0.16 10*3/uL — ABNORMAL HIGH (ref 0.00–0.07)
Basophils Absolute: 0.1 10*3/uL (ref 0.0–0.1)
Basophils Relative: 0 %
Eosinophils Absolute: 0.3 10*3/uL (ref 0.0–0.5)
Eosinophils Relative: 2 %
HCT: 35.7 % — ABNORMAL LOW (ref 39.0–52.0)
Hemoglobin: 12.6 g/dL — ABNORMAL LOW (ref 13.0–17.0)
Immature Granulocytes: 1 %
Lymphocytes Relative: 11 %
Lymphs Abs: 2.1 10*3/uL (ref 0.7–4.0)
MCH: 31.3 pg (ref 26.0–34.0)
MCHC: 35.3 g/dL (ref 30.0–36.0)
MCV: 88.6 fL (ref 80.0–100.0)
Monocytes Absolute: 1.5 10*3/uL — ABNORMAL HIGH (ref 0.1–1.0)
Monocytes Relative: 8 %
Neutro Abs: 14.6 10*3/uL — ABNORMAL HIGH (ref 1.7–7.7)
Neutrophils Relative %: 78 %
Platelets: 423 10*3/uL — ABNORMAL HIGH (ref 150–400)
RBC: 4.03 MIL/uL — ABNORMAL LOW (ref 4.22–5.81)
RDW: 12.6 % (ref 11.5–15.5)
WBC: 18.8 10*3/uL — ABNORMAL HIGH (ref 4.0–10.5)
nRBC: 0 % (ref 0.0–0.2)

## 2020-09-12 LAB — PATHOLOGIST SMEAR REVIEW

## 2020-09-12 LAB — C-REACTIVE PROTEIN: CRP: 2.7 mg/dL — ABNORMAL HIGH (ref <1.0)

## 2020-09-12 LAB — MAGNESIUM: Magnesium: 2.1 mg/dL (ref 1.7–2.4)

## 2020-09-12 LAB — LEGIONELLA PNEUMOPHILA SEROGP 1 UR AG: L. pneumophila Serogp 1 Ur Ag: NEGATIVE

## 2020-09-12 LAB — D-DIMER, QUANTITATIVE: D-Dimer, Quant: 2.61 ug/mL-FEU — ABNORMAL HIGH (ref 0.00–0.50)

## 2020-09-12 LAB — PROCALCITONIN: Procalcitonin: <0.1 ng/mL

## 2020-09-12 LAB — BRAIN NATRIURETIC PEPTIDE: B Natriuretic Peptide: 197.2 pg/mL — ABNORMAL HIGH (ref 0.0–100.0)

## 2020-09-12 MED ORDER — DOXYCYCLINE HYCLATE 100 MG PO TABS
100.0000 mg | ORAL_TABLET | Freq: Two times a day (BID) | ORAL | Status: DC
  Administered 2020-09-12 – 2020-09-14 (×5): 100 mg via ORAL
  Filled 2020-09-12 (×5): qty 1

## 2020-09-12 MED ORDER — SODIUM CHLORIDE 0.9 % IV SOLN
2.0000 g | INTRAVENOUS | Status: DC
  Administered 2020-09-13 – 2020-09-14 (×2): 2 g via INTRAVENOUS
  Filled 2020-09-12 (×2): qty 20

## 2020-09-12 MED ORDER — BENZONATATE 100 MG PO CAPS: 200.0000 mg | ORAL_CAPSULE | Freq: Three times a day (TID) | ORAL | Status: DC | PRN

## 2020-09-12 MED ORDER — IPRATROPIUM-ALBUTEROL 0.5-2.5 (3) MG/3ML IN SOLN
3.0000 mL | Freq: Four times a day (QID) | RESPIRATORY_TRACT | Status: DC
  Administered 2020-09-12: 3 mL via RESPIRATORY_TRACT
  Filled 2020-09-12: qty 3

## 2020-09-12 MED ORDER — IPRATROPIUM-ALBUTEROL 0.5-2.5 (3) MG/3ML IN SOLN
3.0000 mL | Freq: Three times a day (TID) | RESPIRATORY_TRACT | Status: DC
  Administered 2020-09-13 – 2020-09-14 (×4): 3 mL via RESPIRATORY_TRACT
  Filled 2020-09-12 (×4): qty 3

## 2020-09-12 MED ORDER — BUDESONIDE 0.5 MG/2ML IN SUSP
0.2500 mg | Freq: Two times a day (BID) | RESPIRATORY_TRACT | Status: DC
  Administered 2020-09-12 – 2020-09-14 (×4): 0.25 mg via RESPIRATORY_TRACT
  Filled 2020-09-12 (×4): qty 2

## 2020-09-12 MED ORDER — ALBUTEROL SULFATE (2.5 MG/3ML) 0.083% IN NEBU
2.5000 mg | INHALATION_SOLUTION | RESPIRATORY_TRACT | Status: DC | PRN
  Administered 2020-09-13: 2.5 mg via RESPIRATORY_TRACT
  Filled 2020-09-12: qty 3

## 2020-09-12 NOTE — Progress Notes (Signed)
Ok to optimize Levaquin to ceftriaxone/doxy to complete 5d per Dr. Jerral Ralph.  Ulyses Southward, PharmD, BCIDP, AAHIVP, CPP Infectious Disease Pharmacist 09/12/2020 10:50 AM

## 2020-09-12 NOTE — Progress Notes (Signed)
PROGRESS NOTE        PATIENT DETAILS Name: Greggory KeenSteven Iodice Age: 40 y.o. Sex: male Date of Birth: 22-Aug-1980 Admit Date: 09/10/2020 Admitting Physician John GiovanniVasundhra Rathore, MD [id number]:Patient, No Pcp Per  Brief Narrative: Patient is a 40 y.o. male with history of asthma-COVID-19 infection on July 23, 2020-presented with cough and shortness of breath-he was found to have pneumonia along with asthma exacerbation and admitted to the hospital service.  See below for further details  Significant events: 3/21>> admit to [facility]-pneumonia with asthma exacerbation  Significant studies: 3/22>> chest x-ray: Multifocal pneumonia-right> left  Antimicrobial therapy: Rocephin:3/23>> Doxy:3/23>> Levaquin: 3/22 >> 3/23 3/21 Rocephin:3/21x1 Zithromax:3/21x1  Microbiology data: 3/21>> blood culture: No growth x2 days  Procedures : None  Consults: None   DVT Prophylaxis : enoxaparin (LOVENOX) injection 40 mg Start: 09/11/20 1000    Subjective: Feels better but still not at baseline-still wheezing.  Assessment/Plan: Acute hypoxic respiratory failure due to multifocal pneumonia and asthma exacerbation: Slowly improving-not yet at baseline-still with diffuse rhonchi-continue IV antibiotics/IV steroids and bronchodilators.    Sepsis: Due to pneumonia-sepsis physiology resolving-culture/IV antibiotics as above.  History of COVID-19 infection July 23, 2020  Diet: Diet Order            Diet regular Room service appropriate? Yes; Fluid consistency: Thin  Diet effective now                  Code Status: Full code   Family Communication: None at bedside-patient to update family himself.  Disposition Plan: Status is: Inpatient  Remains inpatient appropriate because:Inpatient level of care appropriate due to severity of illness   Dispo: The patient is from: Home              Anticipated d/c is to: Home              Patient currently is not medically  stable to d/c.   Difficult to place patient No    Barriers to Discharge: Multifocal pneumonia/asthma exacerbation-not at baseline-on IV steroids/IV antibiotics-needs another 1-2 days for stabilization prior to discharge.  Antimicrobial agents: Anti-infectives (From admission, onward)   Start     Dose/Rate Route Frequency Ordered Stop   09/13/20 0800  cefTRIAXone (ROCEPHIN) 2 g in sodium chloride 0.9 % 100 mL IVPB        2 g 200 mL/hr over 30 Minutes Intravenous Every 24 hours 09/12/20 1049 09/16/20 0759   09/12/20 1145  doxycycline (VIBRA-TABS) tablet 100 mg        100 mg Oral Every 12 hours 09/12/20 1049 09/16/20 0959   09/11/20 1230  levofloxacin (LEVAQUIN) tablet 750 mg  Status:  Discontinued        750 mg Oral Daily 09/11/20 1220 09/12/20 1049   09/10/20 2230  cefTRIAXone (ROCEPHIN) 2 g in sodium chloride 0.9 % 100 mL IVPB  Status:  Discontinued        2 g 200 mL/hr over 30 Minutes Intravenous Every 24 hours 09/10/20 2222 09/11/20 1220   09/10/20 2230  azithromycin (ZITHROMAX) 500 mg in sodium chloride 0.9 % 250 mL IVPB  Status:  Discontinued        500 mg 250 mL/hr over 60 Minutes Intravenous Every 24 hours 09/10/20 2222 09/11/20 1220       Time spent: 25- minutes-Greater than 50% of this time was spent in counseling, explanation of  diagnosis, planning of further management, and coordination of care.  MEDICATIONS: Scheduled Meds: . albuterol  2 puff Inhalation Q6H  . doxycycline  100 mg Oral Q12H  . enoxaparin (LOVENOX) injection  40 mg Subcutaneous Q24H  . loratadine  10 mg Oral Daily  . mouth rinse  15 mL Mouth Rinse BID  . melatonin  3 mg Oral QHS  . methylPREDNISolone (SOLU-MEDROL) injection  60 mg Intravenous Q12H   Continuous Infusions: . [START ON 09/13/2020] cefTRIAXone (ROCEPHIN)  IV     PRN Meds:.acetaminophen **OR** [DISCONTINUED] acetaminophen, albuterol, azelastine **AND** fluticasone, dextromethorphan-guaiFENesin   PHYSICAL EXAM: Vital signs: Vitals:    09/11/20 2015 09/11/20 2349 09/12/20 0351 09/12/20 0736  BP: 121/71 (!) 112/54 111/66 108/68  Pulse: 91 70 75 74  Resp: 17 20 16 17   Temp: 98.1 F (36.7 C) 98.3 F (36.8 C) 98.1 F (36.7 C) 98.5 F (36.9 C)  TempSrc: Axillary Axillary Axillary Oral  SpO2: 94% 95% 95% 93%  Weight:      Height:       Filed Weights   09/10/20 2126  Weight: 77.1 kg   Body mass index is 27.44 kg/m.   Gen Exam:Alert awake-not in any distress HEENT:atraumatic, normocephalic Chest: Good air entry-but with diffuse rhonchi all over. CVS:S1S2 regular Abdomen:soft non tender, non distended Extremities:no edema Neurology: Non focal Skin: no rash  I have personally reviewed following labs and imaging studies  LABORATORY DATA: CBC: Recent Labs  Lab 09/10/20 2327 09/11/20 0444 09/12/20 0137  WBC 27.8* 11.7* 18.8*  NEUTROABS 20.0*  --  14.6*  HGB 15.2 13.0 12.6*  HCT 43.5 37.6* 35.7*  MCV 87.3 87.0 88.6  PLT 510* 430* 423*    Basic Metabolic Panel: Recent Labs  Lab 09/10/20 2327 09/11/20 0444 09/12/20 0137  NA 132* 135 138  K 3.1* 3.5 4.4  CL 100 104 108  CO2 20* 21* 25  GLUCOSE 190* 175* 132*  BUN 11 9 10   CREATININE 1.06 0.91 0.78  CALCIUM 8.2* 8.1* 8.5*  MG  --  2.1 2.1    GFR: Estimated Creatinine Clearance: 123.6 mL/min (by C-G formula based on SCr of 0.78 mg/dL).  Liver Function Tests: Recent Labs  Lab 09/10/20 2327 09/12/20 0137  AST 18 17  ALT 21 25  ALKPHOS 61 48  BILITOT 1.5* 0.5  PROT 6.7 5.4*  ALBUMIN 3.2* 2.6*   No results for input(s): LIPASE, AMYLASE in the last 168 hours. No results for input(s): AMMONIA in the last 168 hours.  Coagulation Profile: No results for input(s): INR, PROTIME in the last 168 hours.  Cardiac Enzymes: No results for input(s): CKTOTAL, CKMB, CKMBINDEX, TROPONINI in the last 168 hours.  BNP (last 3 results) No results for input(s): PROBNP in the last 8760 hours.  Lipid Profile: No results for input(s): CHOL, HDL,  LDLCALC, TRIG, CHOLHDL, LDLDIRECT in the last 72 hours.  Thyroid Function Tests: No results for input(s): TSH, T4TOTAL, FREET4, T3FREE, THYROIDAB in the last 72 hours.  Anemia Panel: No results for input(s): VITAMINB12, FOLATE, FERRITIN, TIBC, IRON, RETICCTPCT in the last 72 hours.  Urine analysis:    Component Value Date/Time   COLORURINE YELLOW 09/11/2020 0930   APPEARANCEUR CLOUDY (A) 09/11/2020 0930   LABSPEC 1.028 09/11/2020 0930   PHURINE 5.0 09/11/2020 0930   GLUCOSEU >=500 (A) 09/11/2020 0930   HGBUR NEGATIVE 09/11/2020 0930   BILIRUBINUR NEGATIVE 09/11/2020 0930   KETONESUR 5 (A) 09/11/2020 0930   PROTEINUR NEGATIVE 09/11/2020 0930   NITRITE NEGATIVE 09/11/2020  0930   LEUKOCYTESUR NEGATIVE 09/11/2020 0930    Sepsis Labs: Lactic Acid, Venous    Component Value Date/Time   LATICACIDVEN 3.2 (HH) 09/11/2020 0427    MICROBIOLOGY: Recent Results (from the past 240 hour(s))  Resp Panel by RT-PCR (Flu A&B, Covid) Nasopharyngeal Swab     Status: Abnormal   Collection Time: 09/10/20 11:27 PM   Specimen: Nasopharyngeal Swab; Nasopharyngeal(NP) swabs in vial transport medium  Result Value Ref Range Status   SARS Coronavirus 2 by RT PCR POSITIVE (A) NEGATIVE Final    Comment: RESULT CALLED TO, READ BACK BY AND VERIFIED WITH: J NEWTON RN 09/11/20 0030 JDW (NOTE) SARS-CoV-2 target nucleic acids are DETECTED.  The SARS-CoV-2 RNA is generally detectable in upper respiratory specimens during the acute phase of infection. Positive results are indicative of the presence of the identified virus, but do not rule out bacterial infection or co-infection with other pathogens not detected by the test. Clinical correlation with patient history and other diagnostic information is necessary to determine patient infection status. The expected result is Negative.  Fact Sheet for Patients: BloggerCourse.com  Fact Sheet for Healthcare  Providers: SeriousBroker.it  This test is not yet approved or cleared by the [place] FDA and  has been authorized for detection and/or diagnosis of SARS-CoV-2 by FDA under an Emergency Use Authorization (EUA).  This EUA will remain in effect (meaning this test can be used ) for the duration of  the COVID-19 declaration under Section 564(b)(1) of the Act, 21 U.S.C. section 360bbb-3(b)(1), unless the authorization is terminated or revoked sooner.     Influenza A by PCR NEGATIVE NEGATIVE Final   Influenza B by PCR NEGATIVE NEGATIVE Final    Comment: (NOTE) The Xpert Xpress SARS-CoV-2/FLU/RSV plus assay is intended as an aid in the diagnosis of influenza from Nasopharyngeal swab specimens and should not be used as a sole basis for treatment. Nasal washings and aspirates are unacceptable for Xpert Xpress SARS-CoV-2/FLU/RSV testing.  Fact Sheet for Patients: BloggerCourse.com  Fact Sheet for Healthcare Providers: SeriousBroker.it  This test is not yet approved or cleared by the [place] FDA and has been authorized for detection and/or diagnosis of SARS-CoV-2 by FDA under an Emergency Use Authorization (EUA). This EUA will remain in effect (meaning this test can be used) for the duration of the COVID-19 declaration under Section 564(b)(1) of the Act, 21 U.S.C. section 360bbb-3(b)(1), unless the authorization is terminated or revoked.  Performed at [facility], [address] [place], [place] [address]   Culture, blood (single)     Status: None (Preliminary result)   Collection Time: 09/10/20 11:27 PM   Specimen: BLOOD RIGHT FOREARM  Result Value Ref Range Status   Specimen Description BLOOD RIGHT FOREARM  Final   Special Requests   Final    BOTTLES DRAWN AEROBIC AND ANAEROBIC Blood Culture results may not be optimal due to an inadequate volume of blood received in culture bottles    Culture   Final    NO GROWTH 2 DAYS Performed at [facility], [address] [place], [place] [address]    Report Status PENDING  Incomplete  MRSA PCR Screening     Status: None   Collection Time: 09/11/20  6:20 PM   Specimen: Nasal Mucosa; Nasopharyngeal  Result Value Ref Range Status   MRSA by PCR NEGATIVE NEGATIVE Final    Comment:        The GeneXpert MRSA Assay (FDA approved for NASAL specimens only),  is one component of a comprehensive MRSA colonization surveillance program. It is not intended to diagnose MRSA infection nor to guide or monitor treatment for MRSA infections. Performed at [facility], [address] [place], [place] [address]     RADIOLOGY STUDIES/RESULTS: DG Chest Port 1 View  Result Date: 09/11/2020 CLINICAL DATA:  Shortness of breath. EXAM: PORTABLE CHEST 1 VIEW COMPARISON:  Chest x-ray 09/10/2020. FINDINGS: Multifocal bilateral pulmonary infiltrates most prominent in the right upper and right lower lung again noted. Similar findings noted on prior exam. No pleural effusion or pneumothorax. Heart size normal. Degenerative change thoracic spine. IMPRESSION: Multifocal bilateral pulmonary infiltrates most prominent in the right upper and right lower lung again noted. Similar findings noted on prior exam. Electronically Signed   By: Maisie Fus  Register   On: 09/11/2020 07:46   DG Chest Portable 1 View  Result Date: 09/10/2020 CLINICAL DATA:  Shortness of breath, fever EXAM: PORTABLE CHEST 1 VIEW COMPARISON:  None. FINDINGS: Areas of consolidation in the right upper lobe and right lower lobe. Probable early infiltrates in the left upper lobe. Heart is normal size. No effusions or acute bony abnormality. IMPRESSION: Multifocal airspace opacities bilaterally, right greater than left concerning for multifocal pneumonia. Electronically Signed   By: Charlett Nose M.D.   On: 09/10/2020 22:06     LOS: 1 day   Jeoffrey Massed, MD  [facility]    To contact the attending provider between 7A-7P or the covering provider during after hours 7P-7A, please log into the web site [email or web address] and access using universal [employer] password for that web site. If you do not have the password, please call the hospital operator.  09/12/2020, 11:07 AM

## 2020-09-13 LAB — CBC WITH DIFFERENTIAL/PLATELET
Abs Immature Granulocytes: 0.17 10*3/uL — ABNORMAL HIGH (ref 0.00–0.07)
Basophils Absolute: 0 10*3/uL (ref 0.0–0.1)
Basophils Relative: 0 %
Eosinophils Absolute: 0.1 10*3/uL (ref 0.0–0.5)
Eosinophils Relative: 1 %
HCT: 37.1 % — ABNORMAL LOW (ref 39.0–52.0)
Hemoglobin: 13 g/dL (ref 13.0–17.0)
Immature Granulocytes: 1 %
Lymphocytes Relative: 9 %
Lymphs Abs: 1.5 10*3/uL (ref 0.7–4.0)
MCH: 30.8 pg (ref 26.0–34.0)
MCHC: 35 g/dL (ref 30.0–36.0)
MCV: 87.9 fL (ref 80.0–100.0)
Monocytes Absolute: 1.5 10*3/uL — ABNORMAL HIGH (ref 0.1–1.0)
Monocytes Relative: 9 %
Neutro Abs: 14 10*3/uL — ABNORMAL HIGH (ref 1.7–7.7)
Neutrophils Relative %: 80 %
Platelets: 480 10*3/uL — ABNORMAL HIGH (ref 150–400)
RBC: 4.22 MIL/uL (ref 4.22–5.81)
RDW: 12.6 % (ref 11.5–15.5)
WBC: 17.3 10*3/uL — ABNORMAL HIGH (ref 4.0–10.5)
nRBC: 0 % (ref 0.0–0.2)

## 2020-09-13 LAB — COMPREHENSIVE METABOLIC PANEL
ALT: 41 U/L (ref 0–44)
AST: 21 U/L (ref 15–41)
Albumin: 2.7 g/dL — ABNORMAL LOW (ref 3.5–5.0)
Alkaline Phosphatase: 44 U/L (ref 38–126)
Anion gap: 6 (ref 5–15)
BUN: 22 mg/dL — ABNORMAL HIGH (ref 6–20)
CO2: 25 mmol/L (ref 22–32)
Calcium: 8.3 mg/dL — ABNORMAL LOW (ref 8.9–10.3)
Chloride: 107 mmol/L (ref 98–111)
Creatinine, Ser: 1.08 mg/dL (ref 0.61–1.24)
GFR, Estimated: >60 mL/min (ref >60)
Glucose, Bld: 141 mg/dL — ABNORMAL HIGH (ref 70–99)
Potassium: 4 mmol/L (ref 3.5–5.1)
Sodium: 138 mmol/L (ref 135–145)
Total Bilirubin: 0.2 mg/dL — ABNORMAL LOW (ref 0.3–1.2)
Total Protein: 5.7 g/dL — ABNORMAL LOW (ref 6.5–8.1)

## 2020-09-13 LAB — D-DIMER, QUANTITATIVE: D-Dimer, Quant: 0.89 ug/mL-FEU — ABNORMAL HIGH (ref 0.00–0.50)

## 2020-09-13 LAB — BRAIN NATRIURETIC PEPTIDE: B Natriuretic Peptide: 91 pg/mL (ref 0.0–100.0)

## 2020-09-13 LAB — C-REACTIVE PROTEIN: CRP: 1.1 mg/dL — ABNORMAL HIGH (ref <1.0)

## 2020-09-13 NOTE — Progress Notes (Signed)
PROGRESS NOTE        PATIENT DETAILS Name: Greggory KeenSteven Jupiter Age: 40 y.o. Sex: male Date of Birth: 1981/02/18 Admit Date: 09/10/2020 Admitting Physician John GiovanniVasundhra Rathore, MD ZOX:[id number]:Patient, No Pcp Per  Brief Narrative: Patient is a 40 y.o. male with history of asthma-COVID-19 infection on July 23, 2020-presented with cough and shortness of breath-he was found to have pneumonia along with asthma exacerbation and admitted to the hospital service.  See below for further details  Significant events: 3/21>> admit to [facility]-pneumonia with asthma exacerbation  Significant studies: 3/22>> chest x-ray: Multifocal pneumonia-right> left  Antimicrobial therapy: Rocephin:3/23>> Doxy:3/23>> Levaquin: 3/22 >> 3/23 3/21 Rocephin:3/21x1 Zithromax:3/21x1  Microbiology data: 3/21>> blood culture: No growth x2 days  Procedures : None  Consults: None   DVT Prophylaxis : enoxaparin (LOVENOX) injection 40 mg Start: 09/11/20 1000    Subjective: Feels better compared to yesterday-still with some wheezing-not yet at baseline.  Assessment/Plan: Acute hypoxic respiratory failure due to multifocal pneumonia and asthma exacerbation: Continues to slowly improve-feels better than yesterday-moving air well-but continues to have scattered rhonchi.  Suspect needs 1 additional day of hospitalization before consideration of discharge.  Continue IV antibiotics/IV steroids and bronchodilators.   Sepsis: Due to pneumonia-sepsis physiology resolving-culture/IV antibiotics as above.  History of COVID-19 infection July 23, 2020  Diet: Diet Order            Diet regular Room service appropriate? Yes; Fluid consistency: Thin  Diet effective now                  Code Status: Full code   Family Communication: None at bedside-patient to update family himself.  Disposition Plan: Status is: Inpatient  Remains inpatient appropriate because:Inpatient level of care  appropriate due to severity of illness   Dispo: The patient is from: Home              Anticipated d/c is to: Home              Patient currently is not medically stable to d/c.   Difficult to place patient No    Barriers to Discharge: Multifocal pneumonia/asthma exacerbation-not at baseline-on IV steroids/IV antibiotics-needs another 1 day for stabilization prior to discharge.  Antimicrobial agents: Anti-infectives (From admission, onward)   Start     Dose/Rate Route Frequency Ordered Stop   09/13/20 0800  cefTRIAXone (ROCEPHIN) 2 g in sodium chloride 0.9 % 100 mL IVPB        2 g 200 mL/hr over 30 Minutes Intravenous Every 24 hours 09/12/20 1049 09/16/20 0759   09/12/20 1145  doxycycline (VIBRA-TABS) tablet 100 mg        100 mg Oral Every 12 hours 09/12/20 1049 09/16/20 0959   09/11/20 1230  levofloxacin (LEVAQUIN) tablet 750 mg  Status:  Discontinued        750 mg Oral Daily 09/11/20 1220 09/12/20 1049   09/10/20 2230  cefTRIAXone (ROCEPHIN) 2 g in sodium chloride 0.9 % 100 mL IVPB  Status:  Discontinued        2 g 200 mL/hr over 30 Minutes Intravenous Every 24 hours 09/10/20 2222 09/11/20 1220   09/10/20 2230  azithromycin (ZITHROMAX) 500 mg in sodium chloride 0.9 % 250 mL IVPB  Status:  Discontinued        500 mg 250 mL/hr over 60 Minutes Intravenous Every 24 hours 09/10/20 2222 09/11/20  1220       Time spent: 25- minutes-Greater than 50% of this time was spent in counseling, explanation of diagnosis, planning of further management, and coordination of care.  MEDICATIONS: Scheduled Meds: . budesonide (PULMICORT) nebulizer solution  0.25 mg Nebulization BID  . doxycycline  100 mg Oral Q12H  . enoxaparin (LOVENOX) injection  40 mg Subcutaneous Q24H  . ipratropium-albuterol  3 mL Nebulization TID  . loratadine  10 mg Oral Daily  . mouth rinse  15 mL Mouth Rinse BID  . melatonin  3 mg Oral QHS  . methylPREDNISolone (SOLU-MEDROL) injection  60 mg Intravenous Q12H    Continuous Infusions: . cefTRIAXone (ROCEPHIN)  IV 2 g (09/13/20 0901)   PRN Meds:.acetaminophen **OR** [DISCONTINUED] acetaminophen, albuterol, azelastine **AND** fluticasone, benzonatate, dextromethorphan-guaiFENesin   PHYSICAL EXAM: Vital signs: Vitals:   09/13/20 0741 09/13/20 0829 09/13/20 0830 09/13/20 1149  BP: 119/72   119/71  Pulse: 76   82  Resp: 17   16  Temp: 98.2 F (36.8 C)   97.9 F (36.6 C)  TempSrc: Oral   Oral  SpO2: 91% 94% 94% 92%  Weight:      Height:       Filed Weights   09/10/20 2126  Weight: 77.1 kg   Body mass index is 27.44 kg/m.   Gen Exam:Alert awake-not in any distress HEENT:atraumatic, normocephalic Chest: Moving air well-scattered rhonchi. CVS:S1S2 regular Abdomen:soft non tender, non distended Extremities:no edema Neurology: Non focal Skin: no rash  I have personally reviewed following labs and imaging studies  LABORATORY DATA: CBC: Recent Labs  Lab 09/10/20 2327 09/11/20 0444 09/12/20 0137 09/13/20 0105  WBC 27.8* 11.7* 18.8* 17.3*  NEUTROABS 20.0*  --  14.6* 14.0*  HGB 15.2 13.0 12.6* 13.0  HCT 43.5 37.6* 35.7* 37.1*  MCV 87.3 87.0 88.6 87.9  PLT 510* 430* 423* 480*    Basic Metabolic Panel: Recent Labs  Lab 09/10/20 2327 09/11/20 0444 09/12/20 0137 09/13/20 0105  NA 132* 135 138 138  K 3.1* 3.5 4.4 4.0  CL 100 104 108 107  CO2 20* 21* 25 25  GLUCOSE 190* 175* 132* 141*  BUN 11 9 10  22*  CREATININE 1.06 0.91 0.78 1.08  CALCIUM 8.2* 8.1* 8.5* 8.3*  MG  --  2.1 2.1  --     GFR: Estimated Creatinine Clearance: 91.5 mL/min (by C-G formula based on SCr of 1.08 mg/dL).  Liver Function Tests: Recent Labs  Lab 09/10/20 2327 09/12/20 0137 09/13/20 0105  AST 18 17 21   ALT 21 25 41  ALKPHOS 61 48 44  BILITOT 1.5* 0.5 0.2*  PROT 6.7 5.4* 5.7*  ALBUMIN 3.2* 2.6* 2.7*   No results for input(s): LIPASE, AMYLASE in the last 168 hours. No results for input(s): AMMONIA in the last 168 hours.  Coagulation  Profile: No results for input(s): INR, PROTIME in the last 168 hours.  Cardiac Enzymes: No results for input(s): CKTOTAL, CKMB, CKMBINDEX, TROPONINI in the last 168 hours.  BNP (last 3 results) No results for input(s): PROBNP in the last 8760 hours.  Lipid Profile: No results for input(s): CHOL, HDL, LDLCALC, TRIG, CHOLHDL, LDLDIRECT in the last 72 hours.  Thyroid Function Tests: No results for input(s): TSH, T4TOTAL, FREET4, T3FREE, THYROIDAB in the last 72 hours.  Anemia Panel: No results for input(s): VITAMINB12, FOLATE, FERRITIN, TIBC, IRON, RETICCTPCT in the last 72 hours.  Urine analysis:    Component Value Date/Time   COLORURINE YELLOW 09/11/2020 0930   APPEARANCEUR CLOUDY (A)  09/11/2020 0930   LABSPEC 1.028 09/11/2020 0930   PHURINE 5.0 09/11/2020 0930   GLUCOSEU >=500 (A) 09/11/2020 0930   HGBUR NEGATIVE 09/11/2020 0930   BILIRUBINUR NEGATIVE 09/11/2020 0930   KETONESUR 5 (A) 09/11/2020 0930   PROTEINUR NEGATIVE 09/11/2020 0930   NITRITE NEGATIVE 09/11/2020 0930   LEUKOCYTESUR NEGATIVE 09/11/2020 0930    Sepsis Labs: Lactic Acid, Venous    Component Value Date/Time   LATICACIDVEN 3.2 (HH) 09/11/2020 0427    MICROBIOLOGY: Recent Results (from the past 240 hour(s))  Resp Panel by RT-PCR (Flu A&B, Covid) Nasopharyngeal Swab     Status: Abnormal   Collection Time: 09/10/20 11:27 PM   Specimen: Nasopharyngeal Swab; Nasopharyngeal(NP) swabs in vial transport medium  Result Value Ref Range Status   SARS Coronavirus 2 by RT PCR POSITIVE (A) NEGATIVE Final    Comment: RESULT CALLED TO, READ BACK BY AND VERIFIED WITH: J NEWTON RN 09/11/20 0030 JDW (NOTE) SARS-CoV-2 target nucleic acids are DETECTED.  The SARS-CoV-2 RNA is generally detectable in upper respiratory specimens during the acute phase of infection. Positive results are indicative of the presence of the identified virus, but do not rule out bacterial infection or co-infection with other pathogens  not detected by the test. Clinical correlation with patient history and other diagnostic information is necessary to determine patient infection status. The expected result is Negative.  Fact Sheet for Patients: BloggerCourse.com  Fact Sheet for Healthcare Providers: SeriousBroker.it  This test is not yet approved or cleared by the [place] FDA and  has been authorized for detection and/or diagnosis of SARS-CoV-2 by FDA under an Emergency Use Authorization (EUA).  This EUA will remain in effect (meaning this test can be used ) for the duration of  the COVID-19 declaration under Section 564(b)(1) of the Act, 21 U.S.C. section 360bbb-3(b)(1), unless the authorization is terminated or revoked sooner.     Influenza A by PCR NEGATIVE NEGATIVE Final   Influenza B by PCR NEGATIVE NEGATIVE Final    Comment: (NOTE) The Xpert Xpress SARS-CoV-2/FLU/RSV plus assay is intended as an aid in the diagnosis of influenza from Nasopharyngeal swab specimens and should not be used as a sole basis for treatment. Nasal washings and aspirates are unacceptable for Xpert Xpress SARS-CoV-2/FLU/RSV testing.  Fact Sheet for Patients: BloggerCourse.com  Fact Sheet for Healthcare Providers: SeriousBroker.it  This test is not yet approved or cleared by the [place] FDA and has been authorized for detection and/or diagnosis of SARS-CoV-2 by FDA under an Emergency Use Authorization (EUA). This EUA will remain in effect (meaning this test can be used) for the duration of the COVID-19 declaration under Section 564(b)(1) of the Act, 21 U.S.C. section 360bbb-3(b)(1), unless the authorization is terminated or revoked.  Performed at [facility], [address] [place], [place] [address]   Culture, blood (single)     Status: None (Preliminary result)   Collection Time: 09/10/20 11:27 PM    Specimen: BLOOD RIGHT FOREARM  Result Value Ref Range Status   Specimen Description BLOOD RIGHT FOREARM  Final   Special Requests   Final    BOTTLES DRAWN AEROBIC AND ANAEROBIC Blood Culture results may not be optimal due to an inadequate volume of blood received in culture bottles   Culture   Final    NO GROWTH 3 DAYS Performed at [facility], [address] [place], [place] [address]    Report Status PENDING  Incomplete  MRSA PCR Screening  Status: None   Collection Time: 09/11/20  6:20 PM   Specimen: Nasal Mucosa; Nasopharyngeal  Result Value Ref Range Status   MRSA by PCR NEGATIVE NEGATIVE Final    Comment:        The GeneXpert MRSA Assay (FDA approved for NASAL specimens only), is one component of a comprehensive MRSA colonization surveillance program. It is not intended to diagnose MRSA infection nor to guide or monitor treatment for MRSA infections. Performed at [facility], [address], [place], [place] [address]     RADIOLOGY STUDIES/RESULTS: No results found.   LOS: 2 days   Jeoffrey Massed, MD  [facility]    To contact the attending provider between 7A-7P or the covering provider during after hours 7P-7A, please log into the web site [email or web address] and access using universal  password for that web site. If you do not have the password, please call the hospital operator.  09/13/2020, 12:10 PM

## 2020-09-14 ENCOUNTER — Encounter (HOSPITAL_COMMUNITY): Payer: Self-pay | Admitting: Internal Medicine

## 2020-09-14 ENCOUNTER — Other Ambulatory Visit: Payer: Self-pay | Admitting: Internal Medicine

## 2020-09-14 LAB — CBC WITH DIFFERENTIAL/PLATELET
Abs Immature Granulocytes: 0.11 10*3/uL — ABNORMAL HIGH (ref 0.00–0.07)
Basophils Absolute: 0 10*3/uL (ref 0.0–0.1)
Basophils Relative: 0 %
Eosinophils Absolute: 0.1 10*3/uL (ref 0.0–0.5)
Eosinophils Relative: 1 %
HCT: 39.1 % (ref 39.0–52.0)
Hemoglobin: 13.1 g/dL (ref 13.0–17.0)
Immature Granulocytes: 1 %
Lymphocytes Relative: 12 %
Lymphs Abs: 1.6 10*3/uL (ref 0.7–4.0)
MCH: 29.9 pg (ref 26.0–34.0)
MCHC: 33.5 g/dL (ref 30.0–36.0)
MCV: 89.3 fL (ref 80.0–100.0)
Monocytes Absolute: 0.9 10*3/uL (ref 0.1–1.0)
Monocytes Relative: 7 %
Neutro Abs: 10.4 10*3/uL — ABNORMAL HIGH (ref 1.7–7.7)
Neutrophils Relative %: 79 %
Platelets: 458 10*3/uL — ABNORMAL HIGH (ref 150–400)
RBC: 4.38 MIL/uL (ref 4.22–5.81)
RDW: 12.4 % (ref 11.5–15.5)
WBC: 13.1 10*3/uL — ABNORMAL HIGH (ref 4.0–10.5)
nRBC: 0 % (ref 0.0–0.2)

## 2020-09-14 LAB — D-DIMER, QUANTITATIVE: D-Dimer, Quant: 0.59 ug/mL-FEU — ABNORMAL HIGH (ref 0.00–0.50)

## 2020-09-14 LAB — COMPREHENSIVE METABOLIC PANEL
ALT: 66 U/L — ABNORMAL HIGH (ref 0–44)
AST: 28 U/L (ref 15–41)
Albumin: 2.7 g/dL — ABNORMAL LOW (ref 3.5–5.0)
Alkaline Phosphatase: 51 U/L (ref 38–126)
Anion gap: 8 (ref 5–15)
BUN: 19 mg/dL (ref 6–20)
CO2: 23 mmol/L (ref 22–32)
Calcium: 8.4 mg/dL — ABNORMAL LOW (ref 8.9–10.3)
Chloride: 106 mmol/L (ref 98–111)
Creatinine, Ser: 1.06 mg/dL (ref 0.61–1.24)
GFR, Estimated: >60 mL/min (ref >60)
Glucose, Bld: 169 mg/dL — ABNORMAL HIGH (ref 70–99)
Potassium: 4.2 mmol/L (ref 3.5–5.1)
Sodium: 137 mmol/L (ref 135–145)
Total Bilirubin: 0.4 mg/dL (ref 0.3–1.2)
Total Protein: 5.7 g/dL — ABNORMAL LOW (ref 6.5–8.1)

## 2020-09-14 LAB — C-REACTIVE PROTEIN: CRP: 1 mg/dL — ABNORMAL HIGH (ref <1.0)

## 2020-09-14 LAB — BRAIN NATRIURETIC PEPTIDE: B Natriuretic Peptide: 41.4 pg/mL (ref 0.0–100.0)

## 2020-09-14 MED ORDER — FLUTICASONE FUROATE-VILANTEROL 200-25 MCG/INH IN AEPB: 1.0000 | INHALATION_SPRAY | Freq: Every day | RESPIRATORY_TRACT | 0 refills | Status: AC

## 2020-09-14 MED ORDER — ALBUTEROL SULFATE HFA 108 (90 BASE) MCG/ACT IN AERS
2.0000 | INHALATION_SPRAY | Freq: Four times a day (QID) | RESPIRATORY_TRACT | 0 refills | Status: DC | PRN
Start: 2020-09-14 — End: 2020-10-09

## 2020-09-14 MED ORDER — PREDNISONE 10 MG PO TABS: ORAL_TABLET | ORAL | 0 refills | Status: DC

## 2020-09-14 MED ORDER — FLUTICASONE FUROATE-VILANTEROL 200-25 MCG/INH IN AEPB: 1.0000 | INHALATION_SPRAY | Freq: Every day | RESPIRATORY_TRACT | 0 refills | Status: DC

## 2020-09-14 MED ORDER — ALBUTEROL SULFATE HFA 108 (90 BASE) MCG/ACT IN AERS: 2.0000 | INHALATION_SPRAY | Freq: Four times a day (QID) | RESPIRATORY_TRACT | 0 refills | Status: DC | PRN

## 2020-09-14 MED FILL — ALBUTEROL SULFATE HFA 108 (: 108 (90 BAS | 25 days supply | Qty: 18 | Fill #0

## 2020-09-14 MED FILL — predniSONE 10 MG TABS: 10 | 4 days supply | Qty: 10 | Fill #0

## 2020-09-14 MED FILL — BREO ELLIPTA 200-25 MCG INH: 200-25 | 30 days supply | Qty: 60 | Fill #0

## 2020-09-14 NOTE — TOC Initial Note (Signed)
Transition of Care [facility]) - Initial/Assessment Note    Patient Details  Name: Tracy Pugh MRN: [id number] Date of Birth: May 04, 1981  Transition of Care [facility]) CM/SW Contact:    Janae Bridgeman, RN Phone Number: 09/14/2020, 10:49 AM  Clinical Narrative:                 Case management spoke with the patient on the phone regarding transitions of care to home with his wife.  The patient states that he does not have a primary care physician and patient was set up to call [facility] to establish a PCP in the area in network with his private insurance.  I explained this to the patient and he is aware.  Patient's medications will be switched to the [facility] pharmacy and patient is aware he states that he will make his wife aware to pay the cost with a credit card.  Once the patient receives his TOC medications from the pharmacy, he will be able to discharge home with his wife by car.  Expected Discharge Plan: Home/Self Care Barriers to Discharge: No Barriers Identified   Patient Goals and CMS Choice Patient states their goals for this hospitalization and ongoing recovery are:: Patient looks forward to going home with his wife today. CMS Medicare.gov Compare Post Acute Care list provided to:: Patient Choice offered to / list presented to : Patient  Expected Discharge Plan and Services Expected Discharge Plan: Home/Self Care In-house Referral: Clinical Social [facility] / [facility] Discharge Planning Services: CM Consult,Medication Assistance   Living arrangements for the past 2 months: Single Family Home Expected Discharge Date: 09/14/20                                    Prior Living Arrangements/Services Living arrangements for the past 2 months: Single Family Home Lives with:: Spouse Patient language and need for interpreter reviewed:: Yes Do you feel safe going back to the place where you live?: Yes      Need for Family Participation in Patient Care: Yes  (Comment) Care giver support system in place?: Yes (comment)   Criminal Activity/Legal Involvement Pertinent to Current Situation/Hospitalization: No - Comment as needed  Activities of Daily Living Home Assistive Devices/Equipment: None ADL Screening (condition at time of admission) Patient's cognitive ability adequate to safely complete daily activities?: No Is the patient deaf or have difficulty hearing?: No Does the patient have difficulty seeing, even when wearing glasses/contacts?: No Does the patient have difficulty concentrating, remembering, or making decisions?: No Patient able to express need for assistance with ADLs?: No Does the patient have difficulty dressing or bathing?: No Independently performs ADLs?: Yes (appropriate for developmental age) Does the patient have difficulty walking or climbing stairs?: No Weakness of Legs: None Weakness of Arms/Hands: None  Permission Sought/Granted Permission sought to share information with : Case Manager Permission granted to share information with : Yes, Verbal Permission Granted     Permission granted to share info w AGENCY: TOC pharmacy, [facility] for PCP  Permission granted to share info w Relationship: wife -     Emotional Assessment Appearance:: Appears stated age Attitude/Demeanor/Rapport: Engaged Affect (typically observed): Accepting Orientation: : Oriented to Self,Oriented to Place,Oriented to  Time,Oriented to Situation Alcohol / Substance Use: Not Applicable Psych Involvement: No (comment)  Admission diagnosis:  SOB (shortness of breath) [R06.02] Acute hypoxemic respiratory failure (HCC) [J96.01] Multifocal pneumonia [J18.9] Exacerbation of asthma, unspecified asthma  severity, unspecified whether persistent [J45.901] Patient Active Problem List   Diagnosis Date Noted  . Acute hypoxemic respiratory failure (HCC) 09/11/2020  . Multifocal pneumonia 09/11/2020  . Sepsis (HCC) 09/11/2020  . Asthma  exacerbation 09/11/2020  . Hypokalemia 09/11/2020  . Mild persistent asthma without complication 02/28/2020  . Other allergic rhinitis 02/28/2020  . Adverse food reaction 02/28/2020  . Allergic conjunctivitis of both eyes 02/28/2020   PCP:  Patient, No Pcp Per Pharmacy:   [id number] - [place], [place] - [address] [place]  [address] Phone: [phone] Fax: [phone]     Social Determinants of Health (SDOH) Interventions    Readmission Risk Interventions Readmission Risk Prevention Plan 09/14/2020  Post Dischage Appt Complete  Medication Screening Complete  Transportation Screening Complete

## 2020-09-14 NOTE — Discharge Summary (Signed)
PATIENT DETAILS Name: Tracy KeenSteven Weinberg Age: 40 y.o. Sex: male Date of Birth: November 29, 1980 MRN: [id number]. Admitting Physician: John GiovanniVasundhra Rathore, MD WUJ:[id number]:Patient, No Pcp Per  Admit Date: 09/10/2020 Discharge date: 09/14/2020  Recommendations for Outpatient Follow-up:  1. Follow up with PCP in 1-2 weeks 2. Please obtain CMP/CBC in one week 3. Please repeat two-view chest x-ray in 4 to 6 weeks 4. May need outpatient PFTs.  Admitted From:  Home  Disposition: Home   Home Health: No  Equipment/Devices: None  Discharge Condition: Stable  CODE STATUS: FULL CODE  Diet recommendation:  Diet Order            Diet general           Diet regular Room service appropriate? Yes; Fluid consistency: Thin  Diet effective now                  Brief Narrative: Patient is a 40 y.o. male with history of asthma-COVID-19 infection on July 23, 2020-presented with cough and shortness of breath-he was found to have pneumonia along with asthma exacerbation and admitted to the hospital service.  See below for further details  Significant events: 3/21>> admit to [facility]-pneumonia with asthma exacerbation  Significant studies: 3/22>> chest x-ray: Multifocal pneumonia-right> left  Antimicrobial therapy: Rocephin:3/23>>3/25 Doxy:3/23>>3/25 Levaquin: 3/22 >> 3/23 Rocephin:3/21x1 Zithromax:3/21x1  Microbiology data: 3/21>> blood culture: No growth x2 days  Procedures : None  Consults: None   Brief Hospital Course: Acute hypoxic respiratory failure due to multifocal pneumonia and asthma exacerbation:  Significantly better-feels that he is almost back to his baseline-no wheezing today.  Treated with steroids/IV antibiotics and bronchodilators.  Has completed 5 days of empiric IV antibiotics-suspect does not require any further antibiotics-we will place on tapering steroids and bronchodilators on discharge.  Follow-up with PCP-needs 2 view chest x-ray in 4 to 6 weeks to  document resolution of the infiltrates  Sepsis: Due to pneumonia-sepsis physiology resolving-culture/IV antibiotics as above.  History of COVID-19 infection July 23, 2020   Discharge Diagnoses:  Principal Problem:   Multifocal pneumonia Active Problems:   Acute hypoxemic respiratory failure (HCC)   Sepsis (HCC)   Asthma exacerbation   Hypokalemia   Discharge Instructions:  Activity:  As tolerated   Discharge Instructions    Call MD for:  difficulty breathing, headache or visual disturbances   Complete by: As directed    Diet general   Complete by: As directed    Discharge instructions   Complete by: As directed    Follow with Primary MD  Patient, No Pcp Per in 1-2 weeks  Please get a complete blood count and chemistry panel checked by your Primary MD at your next visit, and again as instructed by your Primary MD.  Get Medicines reviewed and adjusted: Please take all your medications with you for your next visit with your Primary MD  Laboratory/radiological data: Please request your Primary MD to go over all hospital tests and procedure/radiological results at the follow up, please ask your Primary MD to get all Hospital records sent to his/her office.  In some cases, they will be blood work, cultures and biopsy results pending at the time of your discharge. Please request that your primary care M.D. follows up on these results.  Also Note the following: If you experience worsening of your admission symptoms, develop shortness of breath, life threatening emergency, suicidal or homicidal thoughts you must seek medical attention immediately by calling 911 or calling your MD immediately  if symptoms less  severe.  You must read complete instructions/literature along with all the possible adverse reactions/side effects for all the Medicines you take and that have been prescribed to you. Take any new Medicines after you have completely understood and accpet all the possible  adverse reactions/side effects.   Do not drive when taking Pain medications or sleeping medications (Benzodaizepines)  Do not take more than prescribed Pain, Sleep and Anxiety Medications. It is not advisable to combine anxiety,sleep and pain medications without talking with your primary care practitioner  Special Instructions: If you have smoked or chewed Tobacco  in the last 2 yrs please stop smoking, stop any regular Alcohol  and or any Recreational drug use.  Wear Seat belts while driving.  Please note: You were cared for by a hospitalist during your hospital stay. Once you are discharged, your primary care physician will handle any further medical issues. Please note that NO REFILLS for any discharge medications will be authorized once you are discharged, as it is imperative that you return to your primary care physician (or establish a relationship with a primary care physician if you do not have one) for your post hospital discharge needs so that they can reassess your need for medications and monitor your lab values.   Increase activity slowly   Complete by: As directed      Allergies as of 09/14/2020   No Known Allergies     Medication List    STOP taking these medications   Arnuity Ellipta 100 MCG/ACT Aepb Generic drug: Fluticasone Furoate   budesonide 0.5 MG/2ML nebulizer solution Commonly known as: PULMICORT   cetirizine 10 MG tablet Commonly known as: ZYRTEC   dextromethorphan-guaiFENesin 30-600 MG 12hr tablet Commonly known as: MUCINEX DM   ibuprofen 200 MG tablet Commonly known as: ADVIL     TAKE these medications   albuterol 108 (90 Base) MCG/ACT inhaler Commonly known as: VENTOLIN HFA Inhale 2 puffs into the lungs every 6 (six) hours as needed for wheezing or shortness of breath. What changed: how much to take   Azelastine-Fluticasone 137-50 MCG/ACT Susp Place 1 spray into the nose in the morning and at bedtime. What changed:   when to take  this  reasons to take this   fluticasone furoate-vilanterol 200-25 MCG/INH Aepb Commonly known as: BREO ELLIPTA Inhale 1 puff into the lungs daily.   predniSONE 10 MG tablet Commonly known as: DELTASONE Take 40 mg daily for 1 day, 30 mg daily for 1 day, 20 mg daily for 1 days,10 mg daily for 1 day, then stop   sodium chloride 0.65 % Soln nasal spray Commonly known as: OCEAN Place 1 spray into both nostrils as needed for congestion.       Follow-up Information    Parrett, Virgel Bouquet, NP Follow up on 09/25/2020.   Specialty: Pulmonary Disease Why: appoointment at 10:30 Contact information: [address] [place] [address] [phone]              No Known Allergies    Other Procedures/Studies: DG Chest Port 1 View  Result Date: 09/11/2020 CLINICAL DATA:  Shortness of breath. EXAM: PORTABLE CHEST 1 VIEW COMPARISON:  Chest x-ray 09/10/2020. FINDINGS: Multifocal bilateral pulmonary infiltrates most prominent in the right upper and right lower lung again noted. Similar findings noted on prior exam. No pleural effusion or pneumothorax. Heart size normal. Degenerative change thoracic spine. IMPRESSION: Multifocal bilateral pulmonary infiltrates most prominent in the right upper and right lower lung again noted. Similar  findings noted on prior exam. Electronically Signed   By: Maisie Fus  Register   On: 09/11/2020 07:46   DG Chest Portable 1 View  Result Date: 09/10/2020 CLINICAL DATA:  Shortness of breath, fever EXAM: PORTABLE CHEST 1 VIEW COMPARISON:  None. FINDINGS: Areas of consolidation in the right upper lobe and right lower lobe. Probable early infiltrates in the left upper lobe. Heart is normal size. No effusions or acute bony abnormality. IMPRESSION: Multifocal airspace opacities bilaterally, right greater than left concerning for multifocal pneumonia. Electronically Signed   By: Charlett Nose M.D.   On: 09/10/2020 22:06     TODAY-DAY OF DISCHARGE:  Subjective:    Tracy Pugh today has no headache,no chest abdominal pain,no new weakness tingling or numbness, feels much better wants to go home today.   Objective:   Blood pressure 113/72, pulse 78, temperature 98.4 F (36.9 C), temperature source Oral, resp. rate 17, height  (1.676 m), weight 77.1 kg, SpO2 94 %.  Intake/Output Summary (Last 24 hours) at 09/14/2020 1017 Last data filed at 09/14/2020 0400 Gross per 24 hour  Intake 340 ml  Output -  Net 340 ml   Filed Weights   09/10/20 2126  Weight: 77.1 kg    Exam: Awake Alert, Oriented *3, No new F.N deficits, Normal affect [place].AT,PERRAL Supple Neck,No JVD, No cervical lymphadenopathy appriciated.  Symmetrical Chest wall movement, Good air movement bilaterally, CTAB RRR,No Gallops,Rubs or new Murmurs, No Parasternal Heave +ve B.Sounds, Abd Soft, Non tender, No organomegaly appriciated, No rebound -guarding or rigidity. No Cyanosis, Clubbing or edema, No new Rash or bruise   PERTINENT RADIOLOGIC STUDIES: No results found.   PERTINENT LAB RESULTS: CBC: Recent Labs    09/13/20 0105 09/14/20 0153  WBC 17.3* 13.1*  HGB 13.0 13.1  HCT 37.1* 39.1  PLT 480* 458*   CMET CMP     Component Value Date/Time   NA 137 09/14/2020 0153   K 4.2 09/14/2020 0153   CL 106 09/14/2020 0153   CO2 23 09/14/2020 0153   GLUCOSE 169 (H) 09/14/2020 0153   BUN 19 09/14/2020 0153   CREATININE 1.06 09/14/2020 0153   CALCIUM 8.4 (L) 09/14/2020 0153   PROT 5.7 (L) 09/14/2020 0153   ALBUMIN 2.7 (L) 09/14/2020 0153   AST 28 09/14/2020 0153   ALT 66 (H) 09/14/2020 0153   ALKPHOS 51 09/14/2020 0153   BILITOT 0.4 09/14/2020 0153   GFRNONAA >60 09/14/2020 0153   GFRAA >60 03/13/2020 1657    GFR Estimated Creatinine Clearance: 93.3 mL/min (by C-G formula based on SCr of 1.06 mg/dL). No results for input(s): LIPASE, AMYLASE in the last 72 hours. No results for input(s): CKTOTAL, CKMB, CKMBINDEX, TROPONINI in the last 72 hours. Invalid  input(s): POCBNP Recent Labs    09/13/20 0105 09/14/20 0153  DDIMER 0.89* 0.59*   No results for input(s): HGBA1C in the last 72 hours. No results for input(s): CHOL, HDL, LDLCALC, TRIG, CHOLHDL, LDLDIRECT in the last 72 hours. No results for input(s): TSH, T4TOTAL, T3FREE, THYROIDAB in the last 72 hours.  Invalid input(s): FREET3 No results for input(s): VITAMINB12, FOLATE, FERRITIN, TIBC, IRON, RETICCTPCT in the last 72 hours. Coags: No results for input(s): INR in the last 72 hours.  Invalid input(s): PT Microbiology: Recent Results (from the past 240 hour(s))  Resp Panel by RT-PCR (Flu A&B, Covid) Nasopharyngeal Swab     Status: Abnormal   Collection Time: 09/10/20 11:27 PM   Specimen: Nasopharyngeal Swab; Nasopharyngeal(NP) swabs in vial transport medium  Result Value Ref Range Status   SARS Coronavirus 2 by RT PCR POSITIVE (A) NEGATIVE Final    Comment: RESULT CALLED TO, READ BACK BY AND VERIFIED WITH: J NEWTON RN 09/11/20 0030 JDW (NOTE) SARS-CoV-2 target nucleic acids are DETECTED.  The SARS-CoV-2 RNA is generally detectable in upper respiratory specimens during the acute phase of infection. Positive results are indicative of the presence of the identified virus, but do not rule out bacterial infection or co-infection with other pathogens not detected by the test. Clinical correlation with patient history and other diagnostic information is necessary to determine patient infection status. The expected result is Negative.  Fact Sheet for Patients: BloggerCourse.com  Fact Sheet for Healthcare Providers: SeriousBroker.it  This test is not yet approved or cleared by the [place] FDA and  has been authorized for detection and/or diagnosis of SARS-CoV-2 by FDA under an Emergency Use Authorization (EUA).  This EUA will remain in effect (meaning this test can be used ) for the duration of  the COVID-19 declaration  under Section 564(b)(1) of the Act, 21 U.S.C. section 360bbb-3(b)(1), unless the authorization is terminated or revoked sooner.     Influenza A by PCR NEGATIVE NEGATIVE Final   Influenza B by PCR NEGATIVE NEGATIVE Final    Comment: (NOTE) The Xpert Xpress SARS-CoV-2/FLU/RSV plus assay is intended as an aid in the diagnosis of influenza from Nasopharyngeal swab specimens and should not be used as a sole basis for treatment. Nasal washings and aspirates are unacceptable for Xpert Xpress SARS-CoV-2/FLU/RSV testing.  Fact Sheet for Patients: BloggerCourse.com  Fact Sheet for Healthcare Providers: SeriousBroker.it  This test is not yet approved or cleared by the [place] FDA and has been authorized for detection and/or diagnosis of SARS-CoV-2 by FDA under an Emergency Use Authorization (EUA). This EUA will remain in effect (meaning this test can be used) for the duration of the COVID-19 declaration under Section 564(b)(1) of the Act, 21 U.S.C. section 360bbb-3(b)(1), unless the authorization is terminated or revoked.  Performed at [facility], [address], [place], [place] [address]   Culture, blood (single)     Status: None (Preliminary result)   Collection Time: 09/10/20 11:27 PM   Specimen: BLOOD RIGHT FOREARM  Result Value Ref Range Status   Specimen Description BLOOD RIGHT FOREARM  Final   Special Requests   Final    BOTTLES DRAWN AEROBIC AND ANAEROBIC Blood Culture results may not be optimal due to an inadequate volume of blood received in culture bottles   Culture   Final    NO GROWTH 4 DAYS Performed at [facility], [address] [place], [place] [address]    Report Status PENDING  Incomplete  MRSA PCR Screening     Status: None   Collection Time: 09/11/20  6:20 PM   Specimen: Nasal Mucosa; Nasopharyngeal  Result Value Ref Range Status   MRSA by PCR NEGATIVE NEGATIVE Final    Comment:         The GeneXpert MRSA Assay (FDA approved for NASAL specimens only), is one component of a comprehensive MRSA colonization surveillance program. It is not intended to diagnose MRSA infection nor to guide or monitor treatment for MRSA infections. Performed at [facility], [address] [place], [place] [address]     FURTHER DISCHARGE INSTRUCTIONS:  Get Medicines reviewed and adjusted: Please take all your medications with you for your next visit with your Primary MD  Laboratory/radiological data: Please request your  Primary MD to go over all hospital tests and procedure/radiological results at the follow up, please ask your Primary MD to get all Hospital records sent to his/her office.  In some cases, they will be blood work, cultures and biopsy results pending at the time of your discharge. Please request that your primary care M.D. goes through all the records of your hospital data and follows up on these results.  Also Note the following: If you experience worsening of your admission symptoms, develop shortness of breath, life threatening emergency, suicidal or homicidal thoughts you must seek medical attention immediately by calling 911 or calling your MD immediately  if symptoms less severe.  You must read complete instructions/literature along with all the possible adverse reactions/side effects for all the Medicines you take and that have been prescribed to you. Take any new Medicines after you have completely understood and accpet all the possible adverse reactions/side effects.   Do not drive when taking Pain medications or sleeping medications (Benzodaizepines)  Do not take more than prescribed Pain, Sleep and Anxiety Medications. It is not advisable to combine anxiety,sleep and pain medications without talking with your primary care practitioner  Special Instructions: If you have smoked or chewed Tobacco  in the last 2 yrs please stop smoking, stop any regular  Alcohol  and or any Recreational drug use.  Wear Seat belts while driving.  Please note: You were cared for by a hospitalist during your hospital stay. Once you are discharged, your primary care physician will handle any further medical issues. Please note that NO REFILLS for any discharge medications will be authorized once you are discharged, as it is imperative that you return to your primary care physician (or establish a relationship with a primary care physician if you do not have one) for your post hospital discharge needs so that they can reassess your need for medications and monitor your lab values.  Total Time spent coordinating discharge including counseling, education and face to face time equals 35 minutes.  SignedJeoffrey Massed 09/14/2020 10:17 AM

## 2020-09-14 NOTE — Progress Notes (Signed)
Greggory Keen to be D/C'd Home per MD order.  Discussed with the patient and all questions fully answered.  VSS, Skin clean, dry and intact without evidence of skin break down, no evidence of skin tears noted. IV catheter discontinued intact. Site without signs and symptoms of complications. Dressing and pressure applied.  An After Visit Summary was printed and given to the patient. Patient received prescription medication.  D/c education completed with patient/family including follow up instructions, medication list, d/c activities limitations if indicated, with other d/c instructions as indicated by MD - patient able to verbalize understanding, all questions fully answered.   Patient instructed to return to ED, call 911, or call MD for any changes in condition.   Patient escorted via WC, and D/C home via private auto.  Velva Harman 09/14/2020 12:34 PM

## 2020-09-15 LAB — CULTURE, BLOOD (SINGLE): Culture: NO GROWTH

## 2020-09-25 ENCOUNTER — Inpatient Hospital Stay: Payer: Commercial Managed Care - PPO | Admitting: Adult Health

## 2020-10-08 ENCOUNTER — Emergency Department (HOSPITAL_COMMUNITY): Payer: Commercial Managed Care - PPO

## 2020-10-08 ENCOUNTER — Encounter (HOSPITAL_COMMUNITY): Payer: Self-pay | Admitting: Emergency Medicine

## 2020-10-08 ENCOUNTER — Inpatient Hospital Stay (HOSPITAL_COMMUNITY)
Admission: EM | Admit: 2020-10-08 | Discharge: 2020-10-13 | DRG: 871 | Disposition: A | Discharge status: Home / Self Care | Payer: Commercial Managed Care - PPO | Attending: Family Medicine | Admitting: Family Medicine

## 2020-10-08 ENCOUNTER — Other Ambulatory Visit: Payer: Self-pay

## 2020-10-08 DIAGNOSIS — R0602 Shortness of breath: Secondary | ICD-10-CM

## 2020-10-08 DIAGNOSIS — Y95 Nosocomial condition: Secondary | ICD-10-CM | POA: Diagnosis present

## 2020-10-08 DIAGNOSIS — J9602 Acute respiratory failure with hypercapnia: Secondary | ICD-10-CM | POA: Diagnosis present

## 2020-10-08 DIAGNOSIS — Z8261 Family history of arthritis: Secondary | ICD-10-CM

## 2020-10-08 DIAGNOSIS — A419 Sepsis, unspecified organism: Principal | ICD-10-CM | POA: Diagnosis present

## 2020-10-08 DIAGNOSIS — Z7951 Long term (current) use of inhaled steroids: Secondary | ICD-10-CM

## 2020-10-08 DIAGNOSIS — J189 Pneumonia, unspecified organism: Secondary | ICD-10-CM | POA: Diagnosis present

## 2020-10-08 DIAGNOSIS — Z87891 Personal history of nicotine dependence: Secondary | ICD-10-CM

## 2020-10-08 DIAGNOSIS — D721 Eosinophilia, unspecified: Secondary | ICD-10-CM | POA: Diagnosis present

## 2020-10-08 DIAGNOSIS — Y92239 Unspecified place in hospital as the place of occurrence of the external cause: Secondary | ICD-10-CM | POA: Diagnosis not present

## 2020-10-08 DIAGNOSIS — Z8701 Personal history of pneumonia (recurrent): Secondary | ICD-10-CM

## 2020-10-08 DIAGNOSIS — J9601 Acute respiratory failure with hypoxia: Secondary | ICD-10-CM | POA: Diagnosis present

## 2020-10-08 DIAGNOSIS — J4531 Mild persistent asthma with (acute) exacerbation: Secondary | ICD-10-CM | POA: Diagnosis present

## 2020-10-08 DIAGNOSIS — Z8042 Family history of malignant neoplasm of prostate: Secondary | ICD-10-CM

## 2020-10-08 DIAGNOSIS — Z79899 Other long term (current) drug therapy: Secondary | ICD-10-CM

## 2020-10-08 DIAGNOSIS — T380X5A Adverse effect of glucocorticoids and synthetic analogues, initial encounter: Secondary | ICD-10-CM | POA: Diagnosis not present

## 2020-10-08 DIAGNOSIS — J329 Chronic sinusitis, unspecified: Secondary | ICD-10-CM | POA: Diagnosis present

## 2020-10-08 DIAGNOSIS — Z20822 Contact with and (suspected) exposure to covid-19: Secondary | ICD-10-CM | POA: Diagnosis present

## 2020-10-08 DIAGNOSIS — Z8616 Personal history of COVID-19: Secondary | ICD-10-CM

## 2020-10-08 DIAGNOSIS — J4530 Mild persistent asthma, uncomplicated: Secondary | ICD-10-CM | POA: Diagnosis present

## 2020-10-08 DIAGNOSIS — E860 Dehydration: Secondary | ICD-10-CM | POA: Diagnosis present

## 2020-10-08 LAB — CBC WITH DIFFERENTIAL/PLATELET
Abs Immature Granulocytes: 0 10*3/uL (ref 0.00–0.07)
Basophils Absolute: 0 10*3/uL (ref 0.0–0.1)
Basophils Relative: 0 %
Eosinophils Absolute: 19.7 10*3/uL — ABNORMAL HIGH (ref 0.0–0.5)
Eosinophils Relative: 61 %
HCT: 41.9 % (ref 39.0–52.0)
Hemoglobin: 14.7 g/dL (ref 13.0–17.0)
Lymphocytes Relative: 10 %
Lymphs Abs: 3.2 10*3/uL (ref 0.7–4.0)
MCH: 30.1 pg (ref 26.0–34.0)
MCHC: 35.1 g/dL (ref 30.0–36.0)
MCV: 85.9 fL (ref 80.0–100.0)
Monocytes Absolute: 1.3 10*3/uL — ABNORMAL HIGH (ref 0.1–1.0)
Monocytes Relative: 4 %
Neutro Abs: 8.1 10*3/uL — ABNORMAL HIGH (ref 1.7–7.7)
Neutrophils Relative %: 25 %
Platelets: 584 10*3/uL — ABNORMAL HIGH (ref 150–400)
RBC: 4.88 MIL/uL (ref 4.22–5.81)
RDW: 12.6 % (ref 11.5–15.5)
WBC: 32.3 10*3/uL — ABNORMAL HIGH (ref 4.0–10.5)
nRBC: 0 % (ref 0.0–0.2)
nRBC: 0 /100 WBC

## 2020-10-08 LAB — BASIC METABOLIC PANEL
Anion gap: 10 (ref 5–15)
BUN: 8 mg/dL (ref 6–20)
CO2: 23 mmol/L (ref 22–32)
Calcium: 7.9 mg/dL — ABNORMAL LOW (ref 8.9–10.3)
Chloride: 100 mmol/L (ref 98–111)
Creatinine, Ser: 0.95 mg/dL (ref 0.61–1.24)
GFR, Estimated: >60 mL/min (ref >60)
Glucose, Bld: 103 mg/dL — ABNORMAL HIGH (ref 70–99)
Potassium: 3.2 mmol/L — ABNORMAL LOW (ref 3.5–5.1)
Sodium: 133 mmol/L — ABNORMAL LOW (ref 135–145)

## 2020-10-08 LAB — TROPONIN I (HIGH SENSITIVITY): Troponin I (High Sensitivity): 42 ng/L — ABNORMAL HIGH (ref <18)

## 2020-10-08 MED ORDER — ALBUTEROL SULFATE HFA 108 (90 BASE) MCG/ACT IN AERS
2.0000 | INHALATION_SPRAY | Freq: Once | RESPIRATORY_TRACT | Status: AC
  Administered 2020-10-09: 2 via RESPIRATORY_TRACT
  Filled 2020-10-08: qty 6.7

## 2020-10-08 NOTE — ED Triage Notes (Signed)
Emergency Medicine Provider Triage Evaluation Note  Tracy Pugh , a 40 y.o. male  was evaluated in triage.  Pt complains of sob, cough, chest pain, and fevers that have been ongoing for about 1 month. He was recently admitted for covid pneumonia and an asthma exacerbation. meds at home have not improved sxs.   Review of Systems  Positive: Chest pain, sob, cough, wheezing, fevers, vomiting, diarrhea Negative: Urinary sxs  Physical Exam  BP 114/75 (BP Location: Right Arm)   Pulse 89   Temp 97.9 F (36.6 C)   Resp 20   SpO2 93%  Gen:   Awake, no distress   HEENT:  Atraumatic  Resp:  Tachypneic, wheezing Cardiac:  Normal rate  Abd:   Nondistended MSK:   Moves extremities without difficulty  Neuro:  Speech clear   Medical Decision Making  Medically screening exam initiated at 10:09 PM.  Appropriate orders placed.  Tracy Pugh was informed that the remainder of the evaluation will be completed by another provider, this initial triage assessment does not replace that evaluation, and the importance of remaining in the ED until their evaluation is complete.  Clinical Impression   40 y/o m here with cough, fever, chest pain, sob, wheezing. Recent pna  MSE was initiated and I personally evaluated the patient and placed orders (if any) at  10:09 PM on October 08, 2020.  The patient appears stable so that the remainder of the MSE may be completed by another provider.    Tracy Pugh, [place] 10/08/20 2209

## 2020-10-08 NOTE — ED Triage Notes (Signed)
Pt c/o shortness of breath and fever. Pt admitted recently admitted for pneumonia and covid. Seen at [facility], sent for further evaluation.

## 2020-10-08 NOTE — ED Notes (Signed)
Pt tested for flu today, states it was negative.

## 2020-10-09 ENCOUNTER — Emergency Department (HOSPITAL_COMMUNITY): Payer: Commercial Managed Care - PPO

## 2020-10-09 ENCOUNTER — Encounter (HOSPITAL_COMMUNITY): Payer: Self-pay | Admitting: Internal Medicine

## 2020-10-09 DIAGNOSIS — Z8701 Personal history of pneumonia (recurrent): Secondary | ICD-10-CM | POA: Diagnosis not present

## 2020-10-09 DIAGNOSIS — R0602 Shortness of breath: Secondary | ICD-10-CM | POA: Diagnosis present

## 2020-10-09 DIAGNOSIS — J4530 Mild persistent asthma, uncomplicated: Secondary | ICD-10-CM

## 2020-10-09 DIAGNOSIS — J4531 Mild persistent asthma with (acute) exacerbation: Secondary | ICD-10-CM | POA: Diagnosis present

## 2020-10-09 DIAGNOSIS — Y95 Nosocomial condition: Secondary | ICD-10-CM | POA: Diagnosis present

## 2020-10-09 DIAGNOSIS — D721 Eosinophilia, unspecified: Secondary | ICD-10-CM | POA: Diagnosis present

## 2020-10-09 DIAGNOSIS — Y92239 Unspecified place in hospital as the place of occurrence of the external cause: Secondary | ICD-10-CM | POA: Diagnosis not present

## 2020-10-09 DIAGNOSIS — J329 Chronic sinusitis, unspecified: Secondary | ICD-10-CM | POA: Diagnosis present

## 2020-10-09 DIAGNOSIS — Z8261 Family history of arthritis: Secondary | ICD-10-CM | POA: Diagnosis not present

## 2020-10-09 DIAGNOSIS — Z8042 Family history of malignant neoplasm of prostate: Secondary | ICD-10-CM | POA: Diagnosis not present

## 2020-10-09 DIAGNOSIS — A419 Sepsis, unspecified organism: Secondary | ICD-10-CM | POA: Diagnosis present

## 2020-10-09 DIAGNOSIS — Z7951 Long term (current) use of inhaled steroids: Secondary | ICD-10-CM | POA: Diagnosis not present

## 2020-10-09 DIAGNOSIS — J9601 Acute respiratory failure with hypoxia: Secondary | ICD-10-CM | POA: Diagnosis present

## 2020-10-09 DIAGNOSIS — Z20822 Contact with and (suspected) exposure to covid-19: Secondary | ICD-10-CM | POA: Diagnosis present

## 2020-10-09 DIAGNOSIS — J4551 Severe persistent asthma with (acute) exacerbation: Secondary | ICD-10-CM | POA: Diagnosis not present

## 2020-10-09 DIAGNOSIS — Z87891 Personal history of nicotine dependence: Secondary | ICD-10-CM | POA: Diagnosis not present

## 2020-10-09 DIAGNOSIS — E860 Dehydration: Secondary | ICD-10-CM | POA: Diagnosis present

## 2020-10-09 DIAGNOSIS — D72118 Other hypereosinophilic syndrome: Secondary | ICD-10-CM | POA: Diagnosis not present

## 2020-10-09 DIAGNOSIS — J9602 Acute respiratory failure with hypercapnia: Secondary | ICD-10-CM | POA: Diagnosis present

## 2020-10-09 DIAGNOSIS — Z79899 Other long term (current) drug therapy: Secondary | ICD-10-CM | POA: Diagnosis not present

## 2020-10-09 DIAGNOSIS — T380X5A Adverse effect of glucocorticoids and synthetic analogues, initial encounter: Secondary | ICD-10-CM | POA: Diagnosis not present

## 2020-10-09 DIAGNOSIS — R918 Other nonspecific abnormal finding of lung field: Secondary | ICD-10-CM | POA: Diagnosis not present

## 2020-10-09 DIAGNOSIS — J189 Pneumonia, unspecified organism: Secondary | ICD-10-CM | POA: Diagnosis present

## 2020-10-09 DIAGNOSIS — D7218 Eosinophilia in diseases classified elsewhere: Secondary | ICD-10-CM | POA: Diagnosis not present

## 2020-10-09 DIAGNOSIS — Z8616 Personal history of COVID-19: Secondary | ICD-10-CM | POA: Diagnosis not present

## 2020-10-09 LAB — RESPIRATORY PANEL BY PCR

## 2020-10-09 LAB — CBC
HCT: 44 % (ref 39.0–52.0)
Hemoglobin: 14.8 g/dL (ref 13.0–17.0)
MCH: 29.7 pg (ref 26.0–34.0)
MCHC: 33.6 g/dL (ref 30.0–36.0)
MCV: 88.4 fL (ref 80.0–100.0)
Platelets: 658 10*3/uL — ABNORMAL HIGH (ref 150–400)
RBC: 4.98 MIL/uL (ref 4.22–5.81)
RDW: 12.8 % (ref 11.5–15.5)
WBC: 32.6 10*3/uL — ABNORMAL HIGH (ref 4.0–10.5)
nRBC: 0 % (ref 0.0–0.2)

## 2020-10-09 LAB — RESP PANEL BY RT-PCR (FLU A&B, COVID) ARPGX2
Influenza A by PCR: NEGATIVE
Influenza B by PCR: NEGATIVE
SARS Coronavirus 2 by RT PCR: NEGATIVE

## 2020-10-09 LAB — BASIC METABOLIC PANEL
Anion gap: 13 (ref 5–15)
BUN: 8 mg/dL (ref 6–20)
CO2: 23 mmol/L (ref 22–32)
Calcium: 8.2 mg/dL — ABNORMAL LOW (ref 8.9–10.3)
Chloride: 98 mmol/L (ref 98–111)
Creatinine, Ser: 0.91 mg/dL (ref 0.61–1.24)
GFR, Estimated: >60 mL/min (ref >60)
Glucose, Bld: 109 mg/dL — ABNORMAL HIGH (ref 70–99)
Potassium: 4.1 mmol/L (ref 3.5–5.1)
Sodium: 134 mmol/L — ABNORMAL LOW (ref 135–145)

## 2020-10-09 LAB — EXPECTORATED SPUTUM ASSESSMENT W GRAM STAIN, RFLX TO RESP C

## 2020-10-09 LAB — HEPATIC FUNCTION PANEL
ALT: 16 U/L (ref 0–44)
AST: 12 U/L — ABNORMAL LOW (ref 15–41)
Albumin: 2.5 g/dL — ABNORMAL LOW (ref 3.5–5.0)
Alkaline Phosphatase: 48 U/L (ref 38–126)
Bilirubin, Direct: 0.2 mg/dL (ref 0.0–0.2)
Indirect Bilirubin: 0.8 mg/dL (ref 0.3–0.9)
Total Bilirubin: 1 mg/dL (ref 0.3–1.2)
Total Protein: 7 g/dL (ref 6.5–8.1)

## 2020-10-09 LAB — APTT: aPTT: 31 seconds (ref 24–36)

## 2020-10-09 LAB — URINALYSIS, ROUTINE W REFLEX MICROSCOPIC
Bilirubin Urine: NEGATIVE
Glucose, UA: NEGATIVE mg/dL
Hgb urine dipstick: NEGATIVE
Ketones, ur: 80 mg/dL — AB
Leukocytes,Ua: NEGATIVE
Nitrite: NEGATIVE
Protein, ur: NEGATIVE mg/dL
Specific Gravity, Urine: 1.033 — ABNORMAL HIGH (ref 1.005–1.030)
pH: 5 (ref 5.0–8.0)

## 2020-10-09 LAB — TROPONIN I (HIGH SENSITIVITY): Troponin I (High Sensitivity): 44 ng/L — ABNORMAL HIGH (ref <18)

## 2020-10-09 LAB — PROTIME-INR
INR: 1.2 (ref 0.8–1.2)
Prothrombin Time: 14.7 seconds (ref 11.4–15.2)

## 2020-10-09 LAB — LACTIC ACID, PLASMA
Lactic Acid, Venous: 1.2 mmol/L (ref 0.5–1.9)
Lactic Acid, Venous: 1.5 mmol/L (ref 0.5–1.9)

## 2020-10-09 LAB — STREP PNEUMONIAE URINARY ANTIGEN: Strep Pneumo Urinary Antigen: NEGATIVE

## 2020-10-09 LAB — PATHOLOGIST SMEAR REVIEW

## 2020-10-09 LAB — MRSA PCR SCREENING: MRSA by PCR: NEGATIVE

## 2020-10-09 LAB — BRAIN NATRIURETIC PEPTIDE: B Natriuretic Peptide: 23.3 pg/mL (ref 0.0–100.0)

## 2020-10-09 MED ORDER — SODIUM CHLORIDE 0.9 % IV SOLN
2.0000 g | INTRAVENOUS | Status: AC
  Administered 2020-10-09 – 2020-10-13 (×5): 2 g via INTRAVENOUS
  Filled 2020-10-09 (×5): qty 20

## 2020-10-09 MED ORDER — ALBUTEROL SULFATE (2.5 MG/3ML) 0.083% IN NEBU: 2.5000 mg | INHALATION_SOLUTION | RESPIRATORY_TRACT | Status: DC | PRN

## 2020-10-09 MED ORDER — SODIUM CHLORIDE 0.9 % IV SOLN
2.0000 g | Freq: Once | INTRAVENOUS | Status: AC
Start: 2020-10-09 — End: 2020-10-09
  Administered 2020-10-09: 2 g via INTRAVENOUS
  Filled 2020-10-09: qty 2

## 2020-10-09 MED ORDER — METHYLPREDNISOLONE SODIUM SUCC 40 MG IJ SOLR
40.0000 mg | Freq: Every day | INTRAMUSCULAR | Status: DC
  Administered 2020-10-09 – 2020-10-10 (×2): 40 mg via INTRAVENOUS
  Filled 2020-10-09 (×2): qty 1

## 2020-10-09 MED ORDER — IPRATROPIUM-ALBUTEROL 0.5-2.5 (3) MG/3ML IN SOLN
3.0000 mL | Freq: Four times a day (QID) | RESPIRATORY_TRACT | Status: DC
  Administered 2020-10-09 – 2020-10-13 (×14): 3 mL via RESPIRATORY_TRACT
  Filled 2020-10-09 (×13): qty 3

## 2020-10-09 MED ORDER — VANCOMYCIN HCL 1000 MG/200ML IV SOLN: 1000.0000 mg | Freq: Once | INTRAVENOUS | Status: DC

## 2020-10-09 MED ORDER — ENOXAPARIN SODIUM 40 MG/0.4ML ~~LOC~~ SOLN
40.0000 mg | SUBCUTANEOUS | Status: DC
  Filled 2020-10-09: qty 0.4

## 2020-10-09 MED ORDER — IPRATROPIUM-ALBUTEROL 0.5-2.5 (3) MG/3ML IN SOLN
3.0000 mL | RESPIRATORY_TRACT | Status: DC
  Administered 2020-10-09 (×2): 3 mL via RESPIRATORY_TRACT
  Filled 2020-10-09 (×2): qty 3

## 2020-10-09 MED ORDER — LACTATED RINGERS IV SOLN: INTRAVENOUS | Status: DC

## 2020-10-09 MED ORDER — METHYLPREDNISOLONE SODIUM SUCC 125 MG IJ SOLR
125.0000 mg | Freq: Once | INTRAMUSCULAR | Status: AC
  Administered 2020-10-09: 125 mg via INTRAVENOUS
  Filled 2020-10-09: qty 2

## 2020-10-09 MED ORDER — IOHEXOL 350 MG/ML SOLN
80.0000 mL | Freq: Once | INTRAVENOUS | Status: AC | PRN
  Administered 2020-10-09: 80 mL via INTRAVENOUS

## 2020-10-09 MED ORDER — SODIUM CHLORIDE 0.9 % IV SOLN
500.0000 mg | Freq: Every day | INTRAVENOUS | Status: AC
  Administered 2020-10-09 – 2020-10-13 (×5): 500 mg via INTRAVENOUS
  Filled 2020-10-09 (×5): qty 500

## 2020-10-09 MED ORDER — ACETAMINOPHEN 650 MG RE SUPP: 650.0000 mg | Freq: Four times a day (QID) | RECTAL | Status: DC | PRN

## 2020-10-09 MED ORDER — IPRATROPIUM-ALBUTEROL 0.5-2.5 (3) MG/3ML IN SOLN
3.0000 mL | RESPIRATORY_TRACT | Status: AC
  Administered 2020-10-09 (×3): 3 mL via RESPIRATORY_TRACT
  Filled 2020-10-09: qty 6
  Filled 2020-10-09: qty 9

## 2020-10-09 MED ORDER — VANCOMYCIN HCL 1500 MG/300ML IV SOLN
1500.0000 mg | Freq: Once | INTRAVENOUS | Status: AC
  Administered 2020-10-09: 1500 mg via INTRAVENOUS
  Filled 2020-10-09: qty 300

## 2020-10-09 MED ORDER — LACTATED RINGERS IV BOLUS (SEPSIS)
1000.0000 mL | Freq: Once | INTRAVENOUS | Status: AC
  Administered 2020-10-09: 1000 mL via INTRAVENOUS

## 2020-10-09 MED ORDER — FLUTICASONE FUROATE-VILANTEROL 200-25 MCG/INH IN AEPB
1.0000 | INHALATION_SPRAY | Freq: Every day | RESPIRATORY_TRACT | Status: DC
  Administered 2020-10-10 – 2020-10-13 (×4): 1 via RESPIRATORY_TRACT
  Filled 2020-10-09: qty 28

## 2020-10-09 MED ORDER — VANCOMYCIN HCL 750 MG/150ML IV SOLN
750.0000 mg | Freq: Three times a day (TID) | INTRAVENOUS | Status: DC
  Administered 2020-10-09: 750 mg via INTRAVENOUS
  Filled 2020-10-09 (×2): qty 150

## 2020-10-09 MED ORDER — ALBUTEROL SULFATE (2.5 MG/3ML) 0.083% IN NEBU
2.5000 mg | INHALATION_SOLUTION | Freq: Four times a day (QID) | RESPIRATORY_TRACT | Status: DC
  Administered 2020-10-09: 2.5 mg via RESPIRATORY_TRACT
  Filled 2020-10-09: qty 3

## 2020-10-09 MED ORDER — ACETAMINOPHEN 325 MG PO TABS
650.0000 mg | ORAL_TABLET | Freq: Four times a day (QID) | ORAL | Status: DC | PRN
  Administered 2020-10-09 – 2020-10-11 (×2): 650 mg via ORAL
  Filled 2020-10-09 (×2): qty 2

## 2020-10-09 NOTE — ED Notes (Signed)
Pt oxygen drop to 88 while walking in room

## 2020-10-09 NOTE — ED Notes (Signed)
Pt informed sputum sample is needed.Pt provided with specimen container.

## 2020-10-09 NOTE — H&P (Signed)
History and Physical    Tracy Pugh GNF:[id number] DOB: 10-09-1980 DOA: 10/08/2020  PCP: Patient, No Pcp Per (Inactive)   Patient coming from: Home.  Chief Complaint: Shortness of breath fever.  HPI: Tracy Pugh is a 40 y.o. male with history of exercise-induced asthma only had COVID-19 infection in January 2022 about 3 months ago was admitted in March last month for pneumonia and discharged on September 14, 2020 states he has been not completely short of breath since discharge but over the last 24 hours has acutely worsened with fever of 101 F at home with productive cough and more wheezing.  Patient states he finds it hard to lie flat.  ED Course: In the ER patient was afebrile but labs show significant leukocytosis of 32,000 and patient becomes easily hypoxic on minimal exertion.  At rest patient looks dyspneic and tachycardic.  CT angiogram of the chest shows bilateral infiltrates concerning for multifocal pneumonia.  COVID test was negative.  On exam patient has diffuse wheezing also.  High sensitive troponins were 42 and 44.  EKG shows normal sinus rhythm.  Repeat COVID test was negative.  Patient admitted for pneumonia and started on antibiotics.  Review of Systems: As per HPI, rest all negative.   Past Medical History:  Diagnosis Date  . Asthma     History reviewed. No pertinent surgical history.   reports that he quit smoking about 3 years ago. He has never used smokeless tobacco. He reports current alcohol use. He reports that he does not use drugs.  No Known Allergies  Family History  Family history unknown: Yes    Prior to Admission medications   Medication Sig Start Date End Date Taking? Authorizing Provider  albuterol (VENTOLIN HFA) 108 (90 Base) MCG/ACT inhaler INHALE 2 PUFFS INTO THE LUNGS EVERY 6 (SIX) HOURS AS NEEDED FOR WHEEZING OR SHORTNESS OF BREATH. 09/14/20 09/14/21 Yes Ghimire, Werner LeanShanker M, MD  Azelastine-Fluticasone 137-50 MCG/ACT SUSP Place 1 spray into  the nose in the morning and at bedtime. Patient taking differently: Place 1 spray into the nose 2 (two) times daily as needed (sinus issues). 02/28/20  Yes Ellamae SiaKim, Yoon M, DO  cetirizine (ZYRTEC) 10 MG tablet Take 10 mg by mouth daily.   Yes [provider]  fluticasone furoate-vilanterol (BREO ELLIPTA) 200-25 MCG/INH AEPB Inhale 1 puff into the lungs daily. 09/14/20 10/14/20 Yes Ghimire, Werner LeanShanker M, MD  sodium chloride (OCEAN) 0.65 % SOLN nasal spray Place 1 spray into both nostrils as needed for congestion. 03/13/20  Yes Couture, Cortni S, PA-C  predniSONE (DELTASONE) 10 MG tablet Take 40 mg daily for 1 day, 30 mg daily for 1 day, 20 mg daily for 1 days,10 mg daily for 1 day, then stop Patient not taking: Reported on 10/09/2020 09/14/20   Maretta BeesGhimire, Shanker M, MD    Physical Exam: Constitutional: Moderately built and nourished. Vitals:   10/09/20 0147 10/09/20 0200 10/09/20 0314 10/09/20 0315  BP:  120/70  120/61  Pulse:  (!) 114  (!) 116  Resp:  (!) 22  (!) 30  Temp:   98.2 F (36.8 C)   TempSrc:   Oral   SpO2: 92% 93%  93%   Eyes: Anicteric no pallor. ENMT: No discharge from the ears eyes nose and mouth. Neck: No mass felt.  No neck rigidity. Respiratory: Bilateral expiratory wheeze and no crepitations. Cardiovascular: S1-S2 heard. Abdomen: Soft nontender bowel sounds present. Musculoskeletal: No edema. Skin: No rash. Neurologic: Alert awake oriented to time place and person.  Moves all extremities. Psychiatric: Appears normal.  Normal affect.   Labs on Admission: I have personally reviewed following labs and imaging studies  CBC: Recent Labs  Lab 10/08/20 2211  WBC 32.3*  NEUTROABS 8.1*  HGB 14.7  HCT 41.9  MCV 85.9  PLT 584*   Basic Metabolic Panel: Recent Labs  Lab 10/08/20 2211  NA 133*  K 3.2*  CL 100  CO2 23  GLUCOSE 103*  BUN 8  CREATININE 0.95  CALCIUM 7.9*   GFR: CrCl cannot be calculated (Unknown ideal weight.). Liver Function Tests: Recent Labs   Lab 10/09/20 0107  AST 12*  ALT 16  ALKPHOS 48  BILITOT 1.0  PROT 7.0  ALBUMIN 2.5*   No results for input(s): LIPASE, AMYLASE in the last 168 hours. No results for input(s): AMMONIA in the last 168 hours. Coagulation Profile: Recent Labs  Lab 10/09/20 0107  INR 1.2   Cardiac Enzymes: No results for input(s): CKTOTAL, CKMB, CKMBINDEX, TROPONINI in the last 168 hours. BNP (last 3 results) No results for input(s): PROBNP in the last 8760 hours. HbA1C: No results for input(s): HGBA1C in the last 72 hours. CBG: No results for input(s): GLUCAP in the last 168 hours. Lipid Profile: No results for input(s): CHOL, HDL, LDLCALC, TRIG, CHOLHDL, LDLDIRECT in the last 72 hours. Thyroid Function Tests: No results for input(s): TSH, T4TOTAL, FREET4, T3FREE, THYROIDAB in the last 72 hours. Anemia Panel: No results for input(s): VITAMINB12, FOLATE, FERRITIN, TIBC, IRON, RETICCTPCT in the last 72 hours. Urine analysis:    Component Value Date/Time   COLORURINE YELLOW 09/11/2020 0930   APPEARANCEUR CLOUDY (A) 09/11/2020 0930   LABSPEC 1.028 09/11/2020 0930   PHURINE 5.0 09/11/2020 0930   GLUCOSEU >=500 (A) 09/11/2020 0930   HGBUR NEGATIVE 09/11/2020 0930   BILIRUBINUR NEGATIVE 09/11/2020 0930   KETONESUR 5 (A) 09/11/2020 0930   PROTEINUR NEGATIVE 09/11/2020 0930   NITRITE NEGATIVE 09/11/2020 0930   LEUKOCYTESUR NEGATIVE 09/11/2020 0930   Sepsis Labs: @LABRCNTIP (procalcitonin:4,lacticidven:4) ) Recent Results (from the past 240 hour(s))  Resp Panel by RT-PCR (Flu A&B, Covid) Nasopharyngeal Swab     Status: None   Collection Time: 10/09/20  1:07 AM   Specimen: Nasopharyngeal Swab; Nasopharyngeal(NP) swabs in vial transport medium  Result Value Ref Range Status   SARS Coronavirus 2 by RT PCR NEGATIVE NEGATIVE Final    Comment: (NOTE) SARS-CoV-2 target nucleic acids are NOT DETECTED.  The SARS-CoV-2 RNA is generally detectable in upper respiratory specimens during the acute  phase of infection. The lowest concentration of SARS-CoV-2 viral copies this assay can detect is 138 copies/mL. A negative result does not preclude SARS-Cov-2 infection and should not be used as the sole basis for treatment or other patient management decisions. A negative result may occur with  improper specimen collection/handling, submission of specimen other than nasopharyngeal swab, presence of viral mutation(s) within the areas targeted by this assay, and inadequate number of viral copies(<138 copies/mL). A negative result must be combined with clinical observations, patient history, and epidemiological information. The expected result is Negative.  Fact Sheet for Patients:  10/11/20  Fact Sheet for Healthcare Providers:  BloggerCourse.com  This test is no t yet approved or cleared by the [place] FDA and  has been authorized for detection and/or diagnosis of SARS-CoV-2 by FDA under an Emergency Use Authorization (EUA). This EUA will remain  in effect (meaning this test can be used) for the duration of the COVID-19 declaration under Section 564(b)(1) of the Act, 21  U.S.C.section 360bbb-3(b)(1), unless the authorization is terminated  or revoked sooner.       Influenza A by PCR NEGATIVE NEGATIVE Final   Influenza B by PCR NEGATIVE NEGATIVE Final    Comment: (NOTE) The Xpert Xpress SARS-CoV-2/FLU/RSV plus assay is intended as an aid in the diagnosis of influenza from Nasopharyngeal swab specimens and should not be used as a sole basis for treatment. Nasal washings and aspirates are unacceptable for Xpert Xpress SARS-CoV-2/FLU/RSV testing.  Fact Sheet for Patients: BloggerCourse.com  Fact Sheet for Healthcare Providers: SeriousBroker.it  This test is not yet approved or cleared by the [place] FDA and has been authorized for detection and/or diagnosis of  SARS-CoV-2 by FDA under an Emergency Use Authorization (EUA). This EUA will remain in effect (meaning this test can be used) for the duration of the COVID-19 declaration under Section 564(b)(1) of the Act, 21 U.S.C. section 360bbb-3(b)(1), unless the authorization is terminated or revoked.  Performed at [facility], [address], [place], [place] [address]      Radiological Exams on Admission: DG Chest 2 View  Result Date: 10/08/2020 CLINICAL DATA:  Cough, shortness of breath EXAM: CHEST - 2 VIEW COMPARISON:  09/11/2020 FINDINGS: Consolidation seen throughout the right lung. Patchy left perihilar airspace opacities. Findings compatible with pneumonia. Heart is normal size. No effusions or acute bony abnormality. IMPRESSION: Patchy airspace disease throughout the right lung and in the left perihilar region compatible with multifocal pneumonia. Electronically Signed   By: Charlett Nose M.D.   On: 10/08/2020 22:32   CT Angio Chest PE W and/or Wo Contrast  Result Date: 10/09/2020 CLINICAL DATA:  Chest pain, shortness of breath EXAM: CT ANGIOGRAPHY CHEST WITH CONTRAST TECHNIQUE: Multidetector CT imaging of the chest was performed using the standard protocol during bolus administration of intravenous contrast. Multiplanar CT image reconstructions and MIPs were obtained to evaluate the vascular anatomy. CONTRAST:  79mL OMNIPAQUE IOHEXOL 350 MG/ML SOLN COMPARISON:  Chest x-ray 10/08/2020 FINDINGS: Cardiovascular: Suboptimal opacification of the pulmonary arteries. No large central pulmonary emboli. Insert Heart. Mediastinum/Nodes: Prominent mediastinal and bilateral hilar lymph nodes, likely reactive. No axillary adenopathy. Lungs/Pleura: Extensive airspace disease throughout the right upper lobe and right lower lobe. Patchy perihilar opacities in the left lung, predominantly lingula. No effusions. Upper Abdomen: Imaging into the upper abdomen demonstrates no acute findings. Musculoskeletal: Chest  wall soft tissues are unremarkable. No acute bony abnormality. Review of the MIP images confirms the above findings. IMPRESSION: Bilateral airspace disease, right greater than left most compatible with multifocal pneumonia. Suboptimal opacification of pulmonary arteries. No large central pulmonary emboli. Electronically Signed   By: Charlett Nose M.D.   On: 10/09/2020 02:51    EKG: Independently reviewed.  Normal sinus rhythm.  Assessment/Plan Principal Problem:   Multifocal pneumonia Active Problems:   Mild persistent asthma without complication    1. Multifocal pneumonia -patient has been empirically started on ceftriaxone and Zithromax and vancomycin.  Follow cultures including blood and sputum.  Closely monitor respiratory status.  Check urine for Legionella and strep antigen.  Check BNP.  If elevated get 2D echo. 2. Asthma with wheezing at this time we will keep patient on steroids and nebulizer. 3. Recent COVID infection.  Since patient has multifocal pneumonia and is dyspneic at rest easily decompensate on walking will need close monitoring for any further worsening and inpatient status.   DVT prophylaxis: Lovenox. Code Status: Full code. Family Communication: Discussed with patient. Disposition Plan: Home. Consults called: None. Admission status:  Inpatient.   Eduard Clos MD [facility] Pager [phone].  If 7PM-7AM, please contact night-coverage [email or web address] Password [facility]  10/09/2020, 3:40 AM

## 2020-10-09 NOTE — Progress Notes (Signed)
Elink following for Sepsis Protocol 

## 2020-10-09 NOTE — Progress Notes (Signed)
[facility] Short Progress note  Patient examined & chart reviewed. Further history obtained.  Past medical history of severe sinus issues.  He underwent sinus surgery in December 21 and has been breathing much better since then.  Positive for COVID in January 2022.  He was not vaccinated. The patient states that he became sick around March 7 and went to an urgent care facility.  A chest x-ray was obtained there and he was told that he had pneumonia.  He was given a azithromycin for 5 days and a prednisone taper which he completed.  He seemed to improve initially but did not completely improve.  He presented to the hospital on 3/21 and was found to have pneumonia and an asthma exacerbation.  In the ED he was on a nonrebreather and in respiratory distress with wheezing.  PCR test was positive and that was thought to be from his prior infection.  Chest x-ray revealed multifocal pneumonia which was greater on the right than the left.  He was treated for sepsis, acute respiratory failure and an asthma exacerbation secondary to multifocal pneumonia he was treated in the hospital with 5 days of antibiotics and sent home with a prednisone taper.  Blood cultures were negative.  Supposed to follow-up with [facility] on April 5 but was called by the pulmonary office and his appointment rescheduled for May 3.  He states that after going home from his hospital stay, his cough did not completely improve.  Since Friday he has been having a great deal of cough shortness of breath.  He feels that while he is eating he is unable to catch his breath and soon after eating he coughs so hard that he vomits up his food.  He is able to take sips of water.  Cough is productive of yellow sputum.  He noted on Sunday that he might have a fever but he did not check.  On Monday he back to the initial urgent care that he went to our March 7.  They did a chest x-ray and noted that his infiltrates were worst when compared to  his last x-ray.  There and was giving nebulizer treatments and sent to the ED.  In ED:  Temperature 101, tachycardic and tachypneic diffuse wheezing. WBC count 32,000, pulse ox 88% on minimal exertion. Actiq acid level normal at 1.2  CTA chest: Extensive infiltrates on the right throughout the right upper and right lower lobe.  Patchy perihilar opacities on the left lung predominantly the lingula. Given cefepime, vancomycin and azithromycin. He was admitted to the hospital.  Antibiotics transitioned to ceftriaxone and azithromycin.  Principal Problem:   Multifocal pneumonia with sepsis, acute respiratory failure - This has been persistent since March 7 - WBC significantly elevated at 32.6 (not been taking any steroids at home) - sputum culture has been obtained and is pending - Strep pneumo antigen is negative -  Respiratory panel influenza and COVID is negative - Blood cultures negative to date - I believe he will need a much longer course of antibiotics this time and evaluation prior to stopping his antibiotics  Active Problems: Asthma exacerbation-history of mild persistent asthma without complication -Continue IV Solu-Medrol, Breo Ellipta, DuoNebs every 4 hours and albuterol nebs every 2 as needed  Dehydration - Has been vomiting and not eating much over the past few days - She has received IV fluids already in the hospital and states he is urinating well at this point and is tolerating food and drink-I  will stop IV fluids and we will need to follow  Prior sinus issues with polyps - s/p surgery in 12/21 - no longer using Azelastine-fluticicone nasal spray  Calvert Cantor ,MD Pager: [email or web address]

## 2020-10-09 NOTE — ED Notes (Signed)
Report attempted rn informed to call in 15 minutes

## 2020-10-09 NOTE — Plan of Care (Signed)
  Problem: Education: Goal: Knowledge of General Education information will improve Description Including pain rating scale, medication(s)/side effects and non-pharmacologic comfort measures Outcome: Progressing   

## 2020-10-09 NOTE — ED Provider Notes (Signed)
[facility] Provider Note   CSN: [id number] Arrival date & time: 10/08/20  2137     History Chief Complaint  Patient presents with  . Shortness of Breath    Tracy KeenSteven Cotterill is a 40 y.o. male.  Recent pneumonia admission to the hospital with IV antibiotics.  Outpatient follow-up scheduled but due to scheduling mishap patient was unable to go and pulm appointment got pushed up further.  Has a history of asthma.  Fever returned 1 day ago.  Went to urgent care, was hypoxic at breathing treatments and was sent here.  He complains of right-sided chest pain with breathing.  He has been wheezing significantly.  Has been short of breath.  Has shortness of breath episodes with coughing.  Coughing has been persistent.  No hemoptysis.  No leg swelling.  But multiple days in the hospital recently since January        Past Medical History:  Diagnosis Date  . Asthma     Patient Active Problem List   Diagnosis Date Noted  . Acute hypoxemic respiratory failure (HCC) 09/11/2020  . Multifocal pneumonia 09/11/2020  . Sepsis (HCC) 09/11/2020  . Asthma exacerbation 09/11/2020  . Hypokalemia 09/11/2020  . Mild persistent asthma without complication 02/28/2020  . Other allergic rhinitis 02/28/2020  . Adverse food reaction 02/28/2020  . Allergic conjunctivitis of both eyes 02/28/2020    History reviewed. No pertinent surgical history.     No family history on file.  Social History   Tobacco Use  . Smoking status: Former Smoker    Quit date: 2019    Years since quitting: 3.2  . Smokeless tobacco: Never Used  Vaping Use  . Vaping Use: Never used  Substance Use Topics  . Alcohol use: Yes  . Drug use: Never    Home Medications Prior to Admission medications   Medication Sig Start Date End Date Taking? Authorizing Provider  albuterol (VENTOLIN HFA) 108 (90 Base) MCG/ACT inhaler INHALE 2 PUFFS INTO THE LUNGS EVERY 6 (SIX) HOURS AS NEEDED FOR  WHEEZING OR SHORTNESS OF BREATH. 09/14/20 09/14/21  Ghimire, Werner LeanShanker M, MD  Azelastine-Fluticasone 137-50 MCG/ACT SUSP Place 1 spray into the nose in the morning and at bedtime. Patient taking differently: Place 1 spray into the nose 2 (two) times daily as needed (sinus issues). 02/28/20   Ellamae SiaKim, Yoon M, DO  fluticasone furoate-vilanterol (BREO ELLIPTA) 200-25 MCG/INH AEPB Inhale 1 puff into the lungs daily. 09/14/20 10/14/20  Ghimire, Werner LeanShanker M, MD  predniSONE (DELTASONE) 10 MG tablet Take 40 mg daily for 1 day, 30 mg daily for 1 day, 20 mg daily for 1 days,10 mg daily for 1 day, then stop 09/14/20   Maretta BeesGhimire, Shanker M, MD  sodium chloride (OCEAN) 0.65 % SOLN nasal spray Place 1 spray into both nostrils as needed for congestion. 03/13/20   Couture, Cortni S, PA-C    Allergies    Patient has no known allergies.  Review of Systems   Review of Systems  Constitutional: Positive for fever. Negative for chills.  HENT: Negative for congestion and rhinorrhea.   Respiratory: Positive for cough, chest tightness and wheezing. Negative for shortness of breath.   Cardiovascular: Positive for chest pain. Negative for palpitations.  Gastrointestinal: Negative for diarrhea, nausea and vomiting.  Genitourinary: Negative for difficulty urinating and dysuria.  Musculoskeletal: Negative for arthralgias and back pain.  Skin: Negative for color change and rash.  Neurological: Negative for light-headedness and headaches.    Physical Exam Updated Vital  Signs BP 120/70   Pulse (!) 114   Temp 97.9 F (36.6 C)   Resp (!) 22   SpO2 93%   Physical Exam Vitals and nursing note reviewed.  Constitutional:      General: He is not in acute distress.    Appearance: Normal appearance.  HENT:     Head: Normocephalic and atraumatic.     Nose: No rhinorrhea.  Eyes:     General:        Right eye: No discharge.        Left eye: No discharge.     Conjunctiva/sclera: Conjunctivae normal.  Cardiovascular:     Rate and  Rhythm: Normal rate and regular rhythm.  Pulmonary:     Effort: Tachypnea present.     Breath sounds: Decreased breath sounds, wheezing (R>L) and rhonchi present.  Abdominal:     General: Abdomen is flat. There is no distension.     Palpations: Abdomen is soft.  Musculoskeletal:        General: No deformity or signs of injury.  Skin:    General: Skin is warm and dry.  Neurological:     General: No focal deficit present.     Mental Status: He is alert. Mental status is at baseline.     Motor: No weakness.  Psychiatric:        Mood and Affect: Mood normal.        Behavior: Behavior normal.        Thought Content: Thought content normal.     ED Results / Procedures / Treatments   Labs (all labs ordered are listed, but only abnormal results are displayed) Labs Reviewed  CBC WITH DIFFERENTIAL/PLATELET - Abnormal; Notable for the following components:      Result Value   WBC 32.3 (*)    Platelets 584 (*)    Neutro Abs 8.1 (*)    Monocytes Absolute 1.3 (*)    Eosinophils Absolute 19.7 (*)    All other components within normal limits  BASIC METABOLIC PANEL - Abnormal; Notable for the following components:   Sodium 133 (*)    Potassium 3.2 (*)    Glucose, Bld 103 (*)    Calcium 7.9 (*)    All other components within normal limits  HEPATIC FUNCTION PANEL - Abnormal; Notable for the following components:   Albumin 2.5 (*)    AST 12 (*)    All other components within normal limits  TROPONIN I (HIGH SENSITIVITY) - Abnormal; Notable for the following components:   Troponin I (High Sensitivity) 42 (*)    All other components within normal limits  TROPONIN I (HIGH SENSITIVITY) - Abnormal; Notable for the following components:   Troponin I (High Sensitivity) 44 (*)    All other components within normal limits  RESP PANEL BY RT-PCR (FLU A&B, COVID) ARPGX2  RAPID INFLUENZA A&B ANTIGENS  CULTURE, BLOOD (ROUTINE X 2)  CULTURE, BLOOD (ROUTINE X 2)  URINE CULTURE  LACTIC ACID, PLASMA   PROTIME-INR  APTT  PATHOLOGIST SMEAR REVIEW  LACTIC ACID, PLASMA  URINALYSIS, ROUTINE W REFLEX MICROSCOPIC    EKG None  Radiology DG Chest 2 View  Result Date: 10/08/2020 CLINICAL DATA:  Cough, shortness of breath EXAM: CHEST - 2 VIEW COMPARISON:  09/11/2020 FINDINGS: Consolidation seen throughout the right lung. Patchy left perihilar airspace opacities. Findings compatible with pneumonia. Heart is normal size. No effusions or acute bony abnormality. IMPRESSION: Patchy airspace disease throughout the right lung and in the left perihilar  region compatible with multifocal pneumonia. Electronically Signed   By: Charlett Nose M.D.   On: 10/08/2020 22:32   CT Angio Chest PE W and/or Wo Contrast  Result Date: 10/09/2020 CLINICAL DATA:  Chest pain, shortness of breath EXAM: CT ANGIOGRAPHY CHEST WITH CONTRAST TECHNIQUE: Multidetector CT imaging of the chest was performed using the standard protocol during bolus administration of intravenous contrast. Multiplanar CT image reconstructions and MIPs were obtained to evaluate the vascular anatomy. CONTRAST:  45mL OMNIPAQUE IOHEXOL 350 MG/ML SOLN COMPARISON:  Chest x-ray 10/08/2020 FINDINGS: Cardiovascular: Suboptimal opacification of the pulmonary arteries. No large central pulmonary emboli. Insert Heart. Mediastinum/Nodes: Prominent mediastinal and bilateral hilar lymph nodes, likely reactive. No axillary adenopathy. Lungs/Pleura: Extensive airspace disease throughout the right upper lobe and right lower lobe. Patchy perihilar opacities in the left lung, predominantly lingula. No effusions. Upper Abdomen: Imaging into the upper abdomen demonstrates no acute findings. Musculoskeletal: Chest wall soft tissues are unremarkable. No acute bony abnormality. Review of the MIP images confirms the above findings. IMPRESSION: Bilateral airspace disease, right greater than left most compatible with multifocal pneumonia. Suboptimal opacification of pulmonary arteries. No  large central pulmonary emboli. Electronically Signed   By: Charlett Nose M.D.   On: 10/09/2020 02:51    Procedures Procedures   Medications Ordered in ED Medications  lactated ringers infusion (has no administration in time range)  lactated ringers bolus 1,000 mL (1,000 mLs Intravenous New Bag/Given 10/09/20 0209)  vancomycin (VANCOREADY) IVPB 1500 mg/300 mL (1,500 mg Intravenous New Bag/Given 10/09/20 0211)  albuterol (VENTOLIN HFA) 108 (90 Base) MCG/ACT inhaler 2 puff (2 puffs Inhalation Given 10/09/20 0106)  ceFEPIme (MAXIPIME) 2 g in sodium chloride 0.9 % 100 mL IVPB (0 g Intravenous Stopped 10/09/20 0209)  methylPREDNISolone sodium succinate (SOLU-MEDROL) 125 mg/2 mL injection 125 mg (125 mg Intravenous Given 10/09/20 0132)  ipratropium-albuterol (DUONEB) 0.5-2.5 (3) MG/3ML nebulizer solution 3 mL (3 mLs Nebulization Given 10/09/20 0148)  iohexol (OMNIPAQUE) 350 MG/ML injection 80 mL (80 mLs Intravenous Contrast Given 10/09/20 0219)    ED Course  I have reviewed the triage vital signs and the nursing notes.  Pertinent labs & imaging results that were available during my care of the patient were reviewed by me and considered in my medical decision making (see chart for details).    MDM Rules/Calculators/A&P                          Patient here with worsening symptoms, likely healthcare associated pneumonia at this point.  Will get steroids breathing treatments broad-spectrum antibiotics.  X-ray reviewed by myself and radiology shows significantly worsening infiltrate bilaterally.  He is intermittently hypoxic urgent care but not here but he is tachypneic and does meet SIRS criteria.  Plus source.  Cultures will be obtained broad-spectrum antibiotics will be given.  CT PE study for further detail of the infectious processes in the lungs as well as to evaluate for blood clot due to multiple hospitalizations and pleuritic type chest pain.  Patient will require admission for further  treatment.  Lab studies fairly unremarkable other than significant leukocytosis.  Patient has mild improvement with symptomatic treatment.  X-ray reviewed triggered me to order CT for further detail.  CT reviewed by radiology myself shows no PE but consistent with multifocal pneumonia.  Patient will need admission.  Broad-spectrum antibiotics given.  Cultures obtained.  Patient has intermittent acute hypoxic respiratory failure with ambulation.  Will need intermittent supplemental oxygen as well.  The patient will be admitted to the hospitalist.  For the remainder this patient's care please see inpatient team notes.  I will intervene as needed while the patient remains in the emergency department.\   Final Clinical Impression(s) / ED Diagnoses Final diagnoses:  SOB (shortness of breath)  Multifocal pneumonia  HCAP (healthcare-associated pneumonia)  Acute hypercapnic respiratory failure (HCC)    Rx / DC Orders ED Discharge Orders    None       Sabino Donovan, MD 10/09/20 [id number]

## 2020-10-09 NOTE — ED Notes (Signed)
One set of cultures obtained 

## 2020-10-09 NOTE — Progress Notes (Signed)
Pharmacy Antibiotic Note  Tracy Pugh is a 40 y.o. male admitted on 10/08/2020 with pneumonia.  Pharmacy has been consulted for Vancomycin dosing. Pt also on Rocephin and azithromycin. Pt received 1500mg  IV vanc in ED ~0200  Plan: Vancomycin 750 mg IV Q 8 hrs. Goal AUC 400-550. Expected AUC: 490 SCr used: 0.95 Will f/u renal function, micro data, and pt's clinical condition Vanc levels prn      Temp (24hrs), Avg:98.1 F (36.7 C), Min:97.9 F (36.6 C), Max:98.2 F (36.8 C)  Recent Labs  Lab 10/08/20 2211 10/09/20 0134  WBC 32.3*  --   CREATININE 0.95  --   LATICACIDVEN  --  1.2    CrCl cannot be calculated (Unknown ideal weight.).    No Known Allergies  Antimicrobials this admission: 4/19 Vanc >>  4/19 Ceftriaxone >>  4/19 Azithromycin >>  Microbiology results: 4/19 BCx:  4/19 UCx:   Thank you for allowing pharmacy to be a part of this patient's care.  5/19, PharmD, BCPS Please see amion for complete clinical pharmacist phone list 10/09/2020 3:43 AM

## 2020-10-09 NOTE — ED Notes (Signed)
Patient transported to CT 

## 2020-10-09 NOTE — ED Notes (Signed)
Attempted report to 3E x 1. 

## 2020-10-09 NOTE — ED Notes (Signed)
Pt on 4 l [place] 

## 2020-10-10 LAB — URINE CULTURE: Culture: NO GROWTH

## 2020-10-10 LAB — HIV ANTIBODY (ROUTINE TESTING W REFLEX): HIV Screen 4th Generation wRfx: NONREACTIVE

## 2020-10-10 LAB — CBC
HCT: 36 % — ABNORMAL LOW (ref 39.0–52.0)
Hemoglobin: 12.4 g/dL — ABNORMAL LOW (ref 13.0–17.0)
MCH: 30.2 pg (ref 26.0–34.0)
MCHC: 34.4 g/dL (ref 30.0–36.0)
MCV: 87.8 fL (ref 80.0–100.0)
Platelets: 540 10*3/uL — ABNORMAL HIGH (ref 150–400)
RBC: 4.1 MIL/uL — ABNORMAL LOW (ref 4.22–5.81)
RDW: 13.1 % (ref 11.5–15.5)
WBC: 17.3 10*3/uL — ABNORMAL HIGH (ref 4.0–10.5)
nRBC: 0 % (ref 0.0–0.2)

## 2020-10-10 LAB — LEGIONELLA PNEUMOPHILA SEROGP 1 UR AG: L. pneumophila Serogp 1 Ur Ag: NEGATIVE

## 2020-10-10 MED ORDER — GUAIFENESIN-DM 100-10 MG/5ML PO SYRP: 5.0000 mL | ORAL_SOLUTION | ORAL | Status: DC | PRN

## 2020-10-10 MED ORDER — PREDNISONE 20 MG PO TABS
40.0000 mg | ORAL_TABLET | Freq: Every day | ORAL | Status: DC
  Administered 2020-10-11: 40 mg via ORAL
  Filled 2020-10-10: qty 2

## 2020-10-10 NOTE — Plan of Care (Signed)
  Problem: Education: Goal: Knowledge of General Education information will improve Description: Including pain rating scale, medication(s)/side effects and non-pharmacologic comfort measures Outcome: Progressing   Problem: Clinical Measurements: Goal: Respiratory complications will improve Outcome: Progressing Goal: Cardiovascular complication will be avoided Outcome: Progressing   Problem: Nutrition: Goal: Adequate nutrition will be maintained Outcome: Progressing   

## 2020-10-10 NOTE — Progress Notes (Signed)
PROGRESS NOTE    Tracy Pugh  JJH:[id number] DOB: 1981/02/21 DOA: 10/08/2020 PCP: Patient, No Pcp Per (Inactive)   Chief Complaint  Patient presents with  . Shortness of Breath   Brief Narrative:  40 y.o. male with history of exercise-induced asthma only had COVID-19 infection in January 2022 about 3 months ago was admitted in March last month for pneumonia and discharged on September 14, 2020.  He's been readmitted on 4/18 with recurrent vs nonresolving pneumonia.  Assessment & Plan:   Principal Problem:   Multifocal pneumonia Active Problems:   Mild persistent asthma without complication  Multifocal pneumonia with sepsis, acute respiratory failure - recurrent vs non resolved? - per discussion, not sure he ever was completely back to his baseline after recent discharge - leukocytosis improving - sputum culture has been obtained and is pending - Strep pneumo antigen is negative - pending legionella  - Respiratory panel influenza and COVID is negative - Blood cultures negative to date - if not improving, needs pulmonary consult -> likely needs pulmonary follow up at discharge if he improves to the point that he's appropriate for discharge without consult here given his persistent pneumonia.  Asthma exacerbation-history of mild persistent asthma without complication -Continue IV Solu-Medrol, Breo Ellipta, DuoNebs every 4 hours and albuterol nebs every 2 as needed  Dehydration - improving, follow   Prior sinus issues with polyps - s/p surgery in 12/21 - no longer using Azelastine-fluticicone nasal spray  DVT prophylaxis: lovenox Code Status: full  Family Communication: father at bedside Disposition:   Status is: Inpatient  Remains inpatient appropriate because:Inpatient level of care appropriate due to severity of illness   Dispo: The patient is from: Home              Anticipated d/c is to: Home              Patient currently is not medically stable to d/c.    Difficult to place patient No   Consultants:   none  Procedures:   none  Antimicrobials:  Anti-infectives (From admission, onward)   Start     Dose/Rate Route Frequency Ordered Stop   10/09/20 1000  vancomycin (VANCOREADY) IVPB 750 mg/150 mL  Status:  Discontinued        750 mg 150 mL/hr over 60 Minutes Intravenous Every 8 hours 10/09/20 0350 10/09/20 1129   10/09/20 0800  cefTRIAXone (ROCEPHIN) 2 g in sodium chloride 0.9 % 100 mL IVPB        2 g 200 mL/hr over 30 Minutes Intravenous Every 24 hours 10/09/20 0339 10/14/20 0759   10/09/20 0345  azithromycin (ZITHROMAX) 500 mg in sodium chloride 0.9 % 250 mL IVPB        500 mg 250 mL/hr over 60 Minutes Intravenous Daily 10/09/20 0339 10/14/20 0559   10/09/20 0115  vancomycin (VANCOREADY) IVPB 1000 mg/200 mL  Status:  Discontinued        1,000 mg 200 mL/hr over 60 Minutes Intravenous  Once 10/09/20 0107 10/09/20 0110   10/09/20 0115  ceFEPIme (MAXIPIME) 2 g in sodium chloride 0.9 % 100 mL IVPB        2 g 200 mL/hr over 30 Minutes Intravenous  Once 10/09/20 0107 10/09/20 0209   10/09/20 0115  vancomycin (VANCOREADY) IVPB 1500 mg/300 mL        1,500 mg 150 mL/hr over 120 Minutes Intravenous  Once 10/09/20 0110 10/09/20 0413     Subjective: Feels Janay Canan little better today  Objective: Vitals:  10/10/20 0752 10/10/20 0756 10/10/20 1132 10/10/20 1430  BP:   108/70   Pulse:   91   Resp:   19   Temp:   98 F (36.7 C)   TempSrc:   Oral   SpO2: 98% 98% 96% 98%  Weight:      Height:        Intake/Output Summary (Last 24 hours) at 10/10/2020 1601 Last data filed at 10/10/2020 1531 Gross per 24 hour  Intake 1180.29 ml  Output 250 ml  Net 930.29 ml   Filed Weights   10/09/20 0315 10/09/20 0923 10/10/20 0321  Weight: 75.8 kg 73.7 kg 73.1 kg    Examination:  General exam: Appears calm and comfortable  Respiratory system: diminished, mild wheezing  Cardiovascular system: S1 & S2 heard, RRR.  Gastrointestinal system: Abdomen  is nondistended, soft and nontender. Central nervous system: Alert and oriented. No focal neurological deficits. Extremities: no LEE Skin: No rashes, lesions or ulcers Psychiatry: Judgement and insight appear normal. Mood & affect appropriate.     Data Reviewed: I have personally reviewed following labs and imaging studies  CBC: Recent Labs  Lab 10/08/20 2211 10/09/20 0339 10/10/20 0419  WBC 32.3* 32.6* 17.3*  NEUTROABS 8.1*  --   --   HGB 14.7 14.8 12.4*  HCT 41.9 44.0 36.0*  MCV 85.9 88.4 87.8  PLT 584* 658* 540*    Basic Metabolic Panel: Recent Labs  Lab 10/08/20 2211 10/09/20 0339  NA 133* 134*  K 3.2* 4.1  CL 100 98  CO2 23 23  GLUCOSE 103* 109*  BUN 8 8  CREATININE 0.95 0.91  CALCIUM 7.9* 8.2*    GFR: Estimated Creatinine Clearance: 100.3 mL/min (by C-G formula based on SCr of 0.91 mg/dL).  Liver Function Tests: Recent Labs  Lab 10/09/20 0107  AST 12*  ALT 16  ALKPHOS 48  BILITOT 1.0  PROT 7.0  ALBUMIN 2.5*    CBG: No results for input(s): GLUCAP in the last 168 hours.   Recent Results (from the past 240 hour(s))  Resp Panel by RT-PCR (Flu Carl Bleecker&B, Covid) Nasopharyngeal Swab     Status: None   Collection Time: 10/09/20  1:07 AM   Specimen: Nasopharyngeal Swab; Nasopharyngeal(NP) swabs in vial transport medium  Result Value Ref Range Status   SARS Coronavirus 2 by RT PCR NEGATIVE NEGATIVE Final    Comment: (NOTE) SARS-CoV-2 target nucleic acids are NOT DETECTED.  The SARS-CoV-2 RNA is generally detectable in upper respiratory specimens during the acute phase of infection. The lowest concentration of SARS-CoV-2 viral copies this assay can detect is 138 copies/mL. Stefani Baik negative result does not preclude SARS-Cov-2 infection and should not be used as the sole basis for treatment or other patient management decisions. Ercell Razon negative result may occur with  improper specimen collection/handling, submission of specimen other than nasopharyngeal swab,  presence of viral mutation(s) within the areas targeted by this assay, and inadequate number of viral copies(<138 copies/mL). Trevontae Lindahl negative result must be combined with clinical observations, patient history, and epidemiological information. The expected result is Negative.  Fact Sheet for Patients:  BloggerCourse.comhttps://www.fda.gov/media/152166/download  Fact Sheet for Healthcare Providers:  SeriousBroker.ithttps://www.fda.gov/media/152162/download  This test is no t yet approved or cleared by the [place] FDA and  has been authorized for detection and/or diagnosis of SARS-CoV-2 by FDA under an Emergency Use Authorization (EUA). This EUA will remain  in effect (meaning this test can be used) for the duration of the COVID-19 declaration under Section 564(b)(1) of the  Act, 21 U.S.C.section 360bbb-3(b)(1), unless the authorization is terminated  or revoked sooner.       Influenza Nathian Stencil by PCR NEGATIVE NEGATIVE Final   Influenza B by PCR NEGATIVE NEGATIVE Final    Comment: (NOTE) The Xpert Xpress SARS-CoV-2/FLU/RSV plus assay is intended as an aid in the diagnosis of influenza from Nasopharyngeal swab specimens and should not be used as Jahki Witham sole basis for treatment. Nasal washings and aspirates are unacceptable for Xpert Xpress SARS-CoV-2/FLU/RSV testing.  Fact Sheet for Patients: BloggerCourse.com  Fact Sheet for Healthcare Providers: SeriousBroker.it  This test is not yet approved or cleared by the [place] FDA and has been authorized for detection and/or diagnosis of SARS-CoV-2 by FDA under an Emergency Use Authorization (EUA). This EUA will remain in effect (meaning this test can be used) for the duration of the COVID-19 declaration under Section 564(b)(1) of the Act, 21 U.S.C. section 360bbb-3(b)(1), unless the authorization is terminated or revoked.  Performed at [facility], [address] [place], [place] [address]   Blood Culture  (routine x 2)     Status: None (Preliminary result)   Collection Time: 10/09/20  1:07 AM   Specimen: BLOOD  Result Value Ref Range Status   Specimen Description BLOOD RIGHT ANTECUBITAL  Final   Special Requests   Final    BOTTLES DRAWN AEROBIC AND ANAEROBIC Blood Culture adequate volume   Culture   Final    NO GROWTH 1 DAY Performed at [facility], [address], [place], [place] [address]    Report Status PENDING  Incomplete  Blood Culture (routine x 2)     Status: None (Preliminary result)   Collection Time: 10/09/20  2:19 AM   Specimen: BLOOD  Result Value Ref Range Status   Specimen Description BLOOD SITE NOT SPECIFIED  Final   Special Requests   Final    BOTTLES DRAWN AEROBIC AND ANAEROBIC Blood Culture adequate volume   Culture   Final    NO GROWTH 1 DAY Performed at [facility], [address], [place], [place] [address]    Report Status PENDING  Incomplete  Urine culture     Status: None   Collection Time: 10/09/20  3:14 AM   Specimen: In/Out Cath Urine  Result Value Ref Range Status   Specimen Description IN/OUT CATH URINE  Final   Special Requests NONE  Final   Culture   Final    NO GROWTH Performed at [facility], [address], [place], [place] [address]    Report Status 10/10/2020 FINAL  Final  Respiratory (~20 pathogens) panel by PCR     Status: None   Collection Time: 10/09/20  3:41 AM   Specimen: Nasopharyngeal Swab; Respiratory  Result Value Ref Range Status   Adenovirus NOT DETECTED NOT DETECTED Final   Coronavirus 229E NOT DETECTED NOT DETECTED Final    Comment: (NOTE) The Coronavirus on the Respiratory Panel, DOES NOT test for the novel  Coronavirus (2019 nCoV)    Coronavirus HKU1 NOT DETECTED NOT DETECTED Final   Coronavirus NL63 NOT DETECTED NOT DETECTED Final   Coronavirus OC43 NOT DETECTED NOT DETECTED Final   Metapneumovirus NOT DETECTED NOT DETECTED Final   Rhinovirus / Enterovirus NOT DETECTED NOT DETECTED Final    Influenza Katlin Ciszewski NOT DETECTED NOT DETECTED Final   Influenza B NOT DETECTED NOT DETECTED Final   Parainfluenza Virus 1 NOT DETECTED NOT DETECTED Final   Parainfluenza Virus 2 NOT DETECTED NOT  DETECTED Final   Parainfluenza Virus 3 NOT DETECTED NOT DETECTED Final   Parainfluenza Virus 4 NOT DETECTED NOT DETECTED Final   Respiratory Syncytial Virus NOT DETECTED NOT DETECTED Final   Bordetella pertussis NOT DETECTED NOT DETECTED Final   Bordetella Parapertussis NOT DETECTED NOT DETECTED Final   Chlamydophila pneumoniae NOT DETECTED NOT DETECTED Final   Mycoplasma pneumoniae NOT DETECTED NOT DETECTED Final    Comment: Performed at [facility], [address], [place], [place] [address]  Culture, sputum-assessment     Status: None   Collection Time: 10/09/20  5:19 AM   Specimen: Sputum  Result Value Ref Range Status   Specimen Description SPUTUM  Final   Special Requests NONE  Final   Sputum evaluation   Final    THIS SPECIMEN IS ACCEPTABLE FOR SPUTUM CULTURE Performed at [facility], [address] [place], [place] [address]    Report Status 10/09/2020 FINAL  Final  Culture, Respiratory w Gram Stain     Status: None (Preliminary result)   Collection Time: 10/09/20  5:19 AM   Specimen: SPU  Result Value Ref Range Status   Specimen Description SPUTUM  Final   Special Requests NONE Reflexed from [id number]  Final   Gram Stain   Final    RARE WBC PRESENT, PREDOMINANTLY PMN RARE GRAM POSITIVE COCCI RARE GRAM NEGATIVE RODS    Culture   Final    CULTURE REINCUBATED FOR BETTER GROWTH Performed at [facility], [address] [place], [place] [address]    Report Status PENDING  Incomplete  MRSA PCR Screening     Status: None   Collection Time: 10/09/20  7:47 AM   Specimen: Nasal Mucosa; Nasopharyngeal  Result Value Ref Range Status   MRSA by PCR NEGATIVE NEGATIVE Final    Comment:        The GeneXpert MRSA Assay (FDA approved for NASAL specimens only), is one component  of Kyra Laffey comprehensive MRSA colonization surveillance program. It is not intended to diagnose MRSA infection nor to guide or monitor treatment for MRSA infections. Performed at [facility], [address], [place], [place] [address]          Radiology Studies: DG Chest 2 View  Result Date: 10/08/2020 CLINICAL DATA:  Cough, shortness of breath EXAM: CHEST - 2 VIEW COMPARISON:  09/11/2020 FINDINGS: Consolidation seen throughout the right lung. Patchy left perihilar airspace opacities. Findings compatible with pneumonia. Heart is normal size. No effusions or acute bony abnormality. IMPRESSION: Patchy airspace disease throughout the right lung and in the left perihilar region compatible with multifocal pneumonia. Electronically Signed   By: Charlett Nose M.D.   On: 10/08/2020 22:32   CT Angio Chest PE W and/or Wo Contrast  Result Date: 10/09/2020 CLINICAL DATA:  Chest pain, shortness of breath EXAM: CT ANGIOGRAPHY CHEST WITH CONTRAST TECHNIQUE: Multidetector CT imaging of the chest was performed using the standard protocol during bolus administration of intravenous contrast. Multiplanar CT image reconstructions and MIPs were obtained to evaluate the vascular anatomy. CONTRAST:  80mL OMNIPAQUE IOHEXOL 350 MG/ML SOLN COMPARISON:  Chest x-ray 10/08/2020 FINDINGS: Cardiovascular: Suboptimal opacification of the pulmonary arteries. No large central pulmonary emboli. Insert Heart. Mediastinum/Nodes: Prominent mediastinal and bilateral hilar lymph nodes, likely reactive. No axillary adenopathy. Lungs/Pleura: Extensive airspace disease throughout the right upper lobe and right lower lobe. Patchy perihilar opacities in the left lung, predominantly lingula. No effusions. Upper Abdomen: Imaging into the upper abdomen demonstrates no acute  findings. Musculoskeletal: Chest wall soft tissues are unremarkable. No acute bony abnormality. Review of the MIP images confirms the above findings. IMPRESSION: Bilateral  airspace disease, right greater than left most compatible with multifocal pneumonia. Suboptimal opacification of pulmonary arteries. No large central pulmonary emboli. Electronically Signed   By: Charlett Nose M.D.   On: 10/09/2020 02:51        Scheduled Meds: . enoxaparin (LOVENOX) injection  40 mg Subcutaneous Q24H  . fluticasone furoate-vilanterol  1 puff Inhalation Daily  . ipratropium-albuterol  3 mL Nebulization Q6H  . methylPREDNISolone (SOLU-MEDROL) injection  40 mg Intravenous Q0600   Continuous Infusions: . azithromycin 500 mg (10/10/20 0523)  . cefTRIAXone (ROCEPHIN)  IV 2 g (10/10/20 0800)     LOS: 1 day    Time spent: over 30 min    Lacretia Nicks, MD [facility]   To contact the attending provider between 7A-7P or the covering provider during after hours 7P-7A, please log into the web site [email or web address] and access using universal [place] password for that web site. If you do not have the password, please call the hospital operator.  10/10/2020, 4:01 PM

## 2020-10-11 ENCOUNTER — Encounter (HOSPITAL_COMMUNITY): Payer: Self-pay | Admitting: Internal Medicine

## 2020-10-11 DIAGNOSIS — D721 Eosinophilia, unspecified: Secondary | ICD-10-CM | POA: Diagnosis not present

## 2020-10-11 DIAGNOSIS — J189 Pneumonia, unspecified organism: Secondary | ICD-10-CM

## 2020-10-11 DIAGNOSIS — D72118 Other hypereosinophilic syndrome: Secondary | ICD-10-CM

## 2020-10-11 DIAGNOSIS — J4551 Severe persistent asthma with (acute) exacerbation: Secondary | ICD-10-CM

## 2020-10-11 LAB — CBC WITH DIFFERENTIAL/PLATELET
Abs Immature Granulocytes: 0 10*3/uL (ref 0.00–0.07)
Basophils Absolute: 0.2 10*3/uL — ABNORMAL HIGH (ref 0.0–0.1)
Basophils Relative: 1 %
Eosinophils Absolute: 6.8 10*3/uL — ABNORMAL HIGH (ref 0.0–0.5)
Eosinophils Relative: 37 %
HCT: 36.5 % — ABNORMAL LOW (ref 39.0–52.0)
Hemoglobin: 12.2 g/dL — ABNORMAL LOW (ref 13.0–17.0)
Lymphocytes Relative: 15 %
Lymphs Abs: 2.7 10*3/uL (ref 0.7–4.0)
MCH: 30.2 pg (ref 26.0–34.0)
MCHC: 33.4 g/dL (ref 30.0–36.0)
MCV: 90.3 fL (ref 80.0–100.0)
Monocytes Absolute: 1.1 10*3/uL — ABNORMAL HIGH (ref 0.1–1.0)
Monocytes Relative: 6 %
Neutro Abs: 7.5 10*3/uL (ref 1.7–7.7)
Neutrophils Relative %: 41 %
Platelets: 560 10*3/uL — ABNORMAL HIGH (ref 150–400)
RBC: 4.04 MIL/uL — ABNORMAL LOW (ref 4.22–5.81)
RDW: 13.3 % (ref 11.5–15.5)
WBC: 18.3 10*3/uL — ABNORMAL HIGH (ref 4.0–10.5)
nRBC: 0 % (ref 0.0–0.2)
nRBC: 0 /100 WBC

## 2020-10-11 LAB — COMPREHENSIVE METABOLIC PANEL
ALT: 37 U/L (ref 0–44)
AST: 27 U/L (ref 15–41)
Albumin: 2.2 g/dL — ABNORMAL LOW (ref 3.5–5.0)
Alkaline Phosphatase: 40 U/L (ref 38–126)
Anion gap: 8 (ref 5–15)
BUN: 14 mg/dL (ref 6–20)
CO2: 23 mmol/L (ref 22–32)
Calcium: 8.2 mg/dL — ABNORMAL LOW (ref 8.9–10.3)
Chloride: 111 mmol/L (ref 98–111)
Creatinine, Ser: 0.72 mg/dL (ref 0.61–1.24)
GFR, Estimated: >60 mL/min (ref >60)
Glucose, Bld: 103 mg/dL — ABNORMAL HIGH (ref 70–99)
Potassium: 3.8 mmol/L (ref 3.5–5.1)
Sodium: 142 mmol/L (ref 135–145)
Total Bilirubin: 0.4 mg/dL (ref 0.3–1.2)
Total Protein: 5.6 g/dL — ABNORMAL LOW (ref 6.5–8.1)

## 2020-10-11 LAB — CULTURE, RESPIRATORY W GRAM STAIN: Culture: NORMAL

## 2020-10-11 LAB — MAGNESIUM: Magnesium: 1.9 mg/dL (ref 1.7–2.4)

## 2020-10-11 LAB — SEDIMENTATION RATE: Sed Rate: 47 mm/hr — ABNORMAL HIGH (ref 0–16)

## 2020-10-11 LAB — PHOSPHORUS: Phosphorus: 4.1 mg/dL (ref 2.5–4.6)

## 2020-10-11 LAB — PARASITE EXAM SCREEN, BLOOD-W CONF TO LABCORP (NOT @ ARMC)

## 2020-10-11 LAB — VITAMIN B12: Vitamin B-12: 671 pg/mL (ref 180–914)

## 2020-10-11 LAB — HIV ANTIBODY (ROUTINE TESTING W REFLEX): HIV Screen 4th Generation wRfx: NONREACTIVE

## 2020-10-11 LAB — C-REACTIVE PROTEIN: CRP: 2 mg/dL — ABNORMAL HIGH (ref <1.0)

## 2020-10-11 MED ORDER — OXYCODONE HCL 5 MG PO TABS
5.0000 mg | ORAL_TABLET | ORAL | Status: DC | PRN
  Administered 2020-10-11 – 2020-10-12 (×2): 5 mg via ORAL
  Filled 2020-10-11 (×2): qty 1

## 2020-10-11 MED ORDER — DICLOFENAC SODIUM 1 % EX GEL
2.0000 g | Freq: Four times a day (QID) | CUTANEOUS | Status: DC | PRN
  Administered 2020-10-11: 2 g via TOPICAL
  Filled 2020-10-11 (×2): qty 100

## 2020-10-11 NOTE — Consult Note (Signed)
[place]  [phone] [phone]) [phone]     ID: Tracy Pugh DOB: 1981/01/16  MR#: [id number]  XYV#:[id number]  Patient Care Team: Patient, No Pcp Per (Inactive) as PCP - General (General Practice) Chauncey Cruel, MD OTHER MD:  CHIEF COMPLAINT: fever, pulmonary infiltrates and eosinophilia  CURRENT TREATMENT: antibiotics, inhalers (not on steroids at present)   HISTORY OF CURRENT ILLNESS: Tracy Pugh has a history of mild persistent asthma followed by Dr Rexene Alberts. This has generally been easily controlled with a rescue inhler in the past. On DEC 2021 he underwent extensive sinus surgery and polypectomy at [facility]. I have not been able to locate the pathology report but according to the patient "there was nothing found." He then was infected with COVID in January-- he was not and is not vaccinated. He recovered completely from that infection.  Then on 08/27/2020 he started to experience increased wheezing, not cleared by inhaler. He was treated through urgent care with zithromax and prednisone, with temporary improvement, but 10 days later he was worse and he ended up being admitted to [facility] 09/11/2020 through 09/14/2020. He returned to work 03/28, but on 10/08/2021 he bought a jar of pickled cauliflower, ate some of it, and had an immediate reaction, with worsening breathing function and fever leading to his current admission 10/08/2020.  Workup here has included a Chest CT/angioo showing no PE, extensive bilateral infiltrates R>>>L, and mediastinal/hilar adenopathy. Labwork showed a leukemoid reaction with eosinophilia w/o atypia or left shift. Cultures and other labwork are as specified below, including a negative repeat COVID test. He was treated with steroids, inhalers and antibiotics and has improved, currently on no steroids.  We were asked to evaluate because of the eosinophilia.  INTERVAL HISTORY: I met with the patient in his hospital room 10/11/2020;  no family was present  REVIEW OF SYSTEMS: While improves, Tracy Pugh still feels it's hard to get a deep breath, there is a "catch" somewhere. He still has an intermittent cough and is bringing up some phlegm, now less frequently streaked with blood. He denies pleurisy. He walks around the room at least once an hour. He denies focal weakness, parestherias or pain. No hematuria. A detailed ROS was otherwise noncontributory  PAST MEDICAL HISTORY: Past Medical History:  Diagnosis Date  . Asthma     PAST SURGICAL HISTORY: Sinus surgery and polypectomy DEC 2021 Wisdom teeth removal  FAMILY HISTORY Family History  Family history unknown: Yes  Patient's parents are both 71 y/o as of April 2022. Father is being treated for prostate cancer. 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 During that job he was frequently exposed to fumes and dust and was careful to use a mask. 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 His wife Tracy Pugh is a Probation officer. His daughter Tracy Pugh (17) is going to school and works at [employer]. The patient is not a church attender    HEALTH MAINTENANCE: Social History   Tobacco Use  . Smoking status: Former Smoker    Quit date: 2019    Years since quitting: 3.3  . Smokeless tobacco: Never Used  Vaping Use  . Vaping Use: Never used  Substance Use Topics  . Alcohol use: Yes  . Drug use: Never     No Known Allergies  Current Facility-Administered  Medications  Medication Dose Route Frequency Provider Last Rate Last Admin  . acetaminophen (TYLENOL) tablet 650 mg  650 mg Oral Q6H PRN Rise Patience, MD   650 mg at 10/11/20 0751   Or  . acetaminophen (TYLENOL) suppository 650 mg  650 mg Rectal Q6H PRN Rise Patience, MD      . albuterol (PROVENTIL) (2.5  MG/3ML) 0.083% nebulizer solution 2.5 mg  2.5 mg Nebulization Q2H PRN Rise Patience, MD      . azithromycin (ZITHROMAX) 500 mg in sodium chloride 0.9 % 250 mL IVPB  500 mg Intravenous Q0600 Rise Patience, MD 250 mL/hr at 10/11/20 0756 500 mg at 10/11/20 0756  . cefTRIAXone (ROCEPHIN) 2 g in sodium chloride 0.9 % 100 mL IVPB  2 g Intravenous Q24H Rise Patience, MD 200 mL/hr at 10/11/20 0915 2 g at 10/11/20 0915  . diclofenac Sodium (VOLTAREN) 1 % topical gel 2 g  2 g Topical QID PRN Tracy Pugh., MD   2 g at 10/11/20 1500  . fluticasone furoate-vilanterol (BREO ELLIPTA) 200-25 MCG/INH 1 puff  1 puff Inhalation Daily Rise Patience, MD   1 puff at 10/11/20 0803  . guaiFENesin-dextromethorphan (ROBITUSSIN DM) 100-10 MG/5ML syrup 5 mL  5 mL Oral Q4H PRN Tracy Pugh, Saima, MD      . ipratropium-albuterol (DUONEB) 0.5-2.5 (3) MG/3ML nebulizer solution 3 mL  3 mL Nebulization Q6H Tracy Pugh, Saima, MD   3 mL at 10/11/20 1412  . oxyCODONE (Oxy IR/ROXICODONE) immediate release tablet 5 mg  5 mg Oral Q4H PRN Tracy Pugh., MD   5 mg at 10/11/20 1205    OBJECTIVE: white man exmained in bed  Vitals:   10/11/20 1412 10/11/20 1502  BP:    Pulse:    Resp:    Temp:    SpO2: 97% 94%     Body mass index is 25.8 kg/m.   Wt Readings from Last 3 Encounters:  10/11/20 159 lb 13.3 oz (72.5 kg)  09/10/20 170 lb (77.1 kg)  03/13/20 165 lb (74.8 kg)      ECOG FS:2 - Symptomatic, <50% confined to bed  Ocular: Sclerae unicteric Lymphatic: No cervical or supraclavicular adenopathy Lungs no rales or rhonchi Heart regular rate and rhythm Abd soft, nontender MSK no focal spinal tenderness, no joint edema Neuro: non-focal, well-oriented, appropriate affect  LAB RESULTS:  CMP     Component Value Date/Time   NA 142 10/11/2020 0436   K 3.8 10/11/2020 0436   CL 111 10/11/2020 0436   CO2 23 10/11/2020 0436   GLUCOSE 103 (H) 10/11/2020 0436   BUN 14 10/11/2020 0436    CREATININE 0.72 10/11/2020 0436   CALCIUM 8.2 (L) 10/11/2020 0436   PROT 5.6 (L) 10/11/2020 0436   ALBUMIN 2.2 (L) 10/11/2020 0436   AST 27 10/11/2020 0436   ALT 37 10/11/2020 0436   ALKPHOS 40 10/11/2020 0436   BILITOT 0.4 10/11/2020 0436   GFRNONAA >60 10/11/2020 0436   GFRAA >60 03/13/2020 1657    No results found for: TOTALPROTELP, ALBUMINELP, A1GS, A2GS, BETS, BETA2SER, GAMS, MSPIKE, SPEI  No results found for: KPAFRELGTCHN, LAMBDASER, KAPLAMBRATIO  Lab Results  Component Value Date   WBC 18.3 (H) 10/11/2020   NEUTROABS 7.5 10/11/2020   HGB 12.2 (L) 10/11/2020   HCT 36.5 (L) 10/11/2020   MCV 90.3 10/11/2020   PLT 560 (H) 10/11/2020    _0 @  No results found for: LABCA2  No components found for:  JGGEZM629  Recent Labs  Lab 10/09/20 0107  INR 1.2    No results found for: LABCA2  No results found for: UTM546  No results found for: TKP546  No results found for: FKC127  No results found for: CA2729  No components found for: HGQUANT  No results found for: CEA1 / No results found for: CEA1   No results found for: AFPTUMOR  No results found for: CHROMOGRNA  No results found for: PSA1  Admission on 10/08/2020  Component Date Value Ref Range Status  . WBC 10/08/2020 32.3* 4.0 - 10.5 K/uL Final  . RBC 10/08/2020 4.88  4.22 - 5.81 MIL/uL Final  . Hemoglobin 10/08/2020 14.7  13.0 - 17.0 g/dL Final  . HCT 10/08/2020 41.9  39.0 - 52.0 % Final  . MCV 10/08/2020 85.9  80.0 - 100.0 fL Final  . MCH 10/08/2020 30.1  26.0 - 34.0 pg Final  . MCHC 10/08/2020 35.1  30.0 - 36.0 g/dL Final  . RDW 10/08/2020 12.6  11.5 - 15.5 % Final  . Platelets 10/08/2020 584* 150 - 400 K/uL Final  . nRBC 10/08/2020 0.0  0.0 - 0.2 % Final  . Neutrophils Relative % 10/08/2020 25  % Final  . Neutro Abs 10/08/2020 8.1* 1.7 - 7.7 K/uL Final  . Lymphocytes Relative 10/08/2020 10  % Final  . Lymphs Abs 10/08/2020 3.2  0.7 - 4.0 K/uL Final  . Monocytes Relative 10/08/2020 4   % Final  . Monocytes Absolute 10/08/2020 1.3* 0.1 - 1.0 K/uL Final  . Eosinophils Relative 10/08/2020 61  % Final  . Eosinophils Absolute 10/08/2020 19.7* 0.0 - 0.5 K/uL Final  . Basophils Relative 10/08/2020 0  % Final  . Basophils Absolute 10/08/2020 0.0  0.0 - 0.1 K/uL Final  . WBC Morphology 10/08/2020 See Note   Final   Smudge Cells  . nRBC 10/08/2020 0  0 /100 WBC Final  . Abs Immature Granulocytes 10/08/2020 0.00  0.00 - 0.07 K/uL Final   Performed at [facility], [place] [address], [place], [place] [address]  . Sodium 10/08/2020 133* 135 - 145 mmol/L Final  . Potassium 10/08/2020 3.2* 3.5 - 5.1 mmol/L Final  . Chloride 10/08/2020 100  98 - 111 mmol/L Final  . CO2 10/08/2020 23  22 - 32 mmol/L Final  . Glucose, Bld 10/08/2020 103* 70 - 99 mg/dL Final   Glucose reference range applies only to samples taken after fasting for at least 8 hours.  . BUN 10/08/2020 8  6 - 20 mg/dL Final  . Creatinine, Ser 10/08/2020 0.95  0.61 - 1.24 mg/dL Final  . Calcium 10/08/2020 7.9* 8.9 - 10.3 mg/dL Final  . GFR, Estimated 10/08/2020 >60  >60 mL/min Final   Comment: (NOTE) Calculated using the CKD-EPI Creatinine Equation (2021)   . Anion gap 10/08/2020 10  5 - 15 Final   Performed at [facility] [address], [place], [place] [address]  . Troponin I (High Sensitivity) 10/08/2020 42* <18 ng/L Final   Comment: (NOTE) Elevated high sensitivity troponin I (hsTnI) values and significant  changes across serial measurements may suggest ACS but many other  chronic and acute conditions are known to elevate hsTnI results.  Refer to the "Links" section for chest pain algorithms and additional  guidance. Performed at [facility], [place] [address] [place], [place] [address]   . Path Review 10/08/2020 Marked eosinophilia. Mild neutrophilia and monocytosis. Thrombocytosis. Clinical correlation recommended.   Final  Comment: Reviewed by Tracy Provost, MD, PhD 10/09/20 Performed at  [facility], [place] [address] [place], [place] [address]   . Troponin I (High Sensitivity) 10/09/2020 44* <18 ng/L Final   Comment: (NOTE) Elevated high sensitivity troponin I (hsTnI) values and significant  changes across serial measurements may suggest ACS but many other  chronic and acute conditions are known to elevate hsTnI results.  Refer to the "Links" section for chest pain algorithms and additional  guidance. Performed at [facility], [place] [address], [place], [place] [address]   . SARS Coronavirus 2 by RT PCR 10/09/2020 NEGATIVE  NEGATIVE Final   Comment: (NOTE) SARS-CoV-2 target nucleic acids are NOT DETECTED.  The SARS-CoV-2 RNA is generally detectable in upper respiratory specimens during the acute phase of infection. The lowest concentration of SARS-CoV-2 viral copies this assay can detect is 138 copies/mL. A negative result does not preclude SARS-Cov-2 infection and should not be used as the sole basis for treatment or other patient management decisions. A negative result may occur with  improper specimen collection/handling, submission of specimen other than nasopharyngeal swab, presence of viral mutation(s) within the areas targeted by this assay, and inadequate number of viral copies(<138 copies/mL). A negative result must be combined with clinical observations, patient history, and epidemiological information. The expected result is Negative.  Fact Sheet for Patients:  EntrepreneurPulse.com.au  Fact Sheet for Healthcare Providers:  IncredibleEmployment.be  This test is no                          t yet approved or cleared by the [place] FDA and  has been authorized for detection and/or diagnosis of SARS-CoV-2 by FDA under an Emergency Use Authorization (EUA). This EUA will remain  in effect (meaning this test can be used) for the duration of the COVID-19 declaration under Section 564(b)(1) of the Act,  21 U.S.C.section 360bbb-3(b)(1), unless the authorization is terminated  or revoked sooner.      . Influenza A by PCR 10/09/2020 NEGATIVE  NEGATIVE Final  . Influenza B by PCR 10/09/2020 NEGATIVE  NEGATIVE Final   Comment: (NOTE) The Xpert Xpress SARS-CoV-2/FLU/RSV plus assay is intended as an aid in the diagnosis of influenza from Nasopharyngeal swab specimens and should not be used as a sole basis for treatment. Nasal washings and aspirates are unacceptable for Xpert Xpress SARS-CoV-2/FLU/RSV testing.  Fact Sheet for Patients: EntrepreneurPulse.com.au  Fact Sheet for Healthcare Providers: IncredibleEmployment.be  This test is not yet approved or cleared by the [place] FDA and has been authorized for detection and/or diagnosis of SARS-CoV-2 by FDA under an Emergency Use Authorization (EUA). This EUA will remain in effect (meaning this test can be used) for the duration of the COVID-19 declaration under Section 564(b)(1) of the Act, 21 U.S.C. section 360bbb-3(b)(1), unless the authorization is terminated or revoked.  Performed at [facility], [place] [address] [place], [place] [address]   . Lactic Acid, Venous 10/09/2020 1.2  0.5 - 1.9 mmol/L Final   Performed at [facility], [place] [address] [place], [place] [address]  . Lactic Acid, Venous 10/09/2020 1.5  0.5 - 1.9 mmol/L Final   Performed at [facility], [place] [address] [place], [place] [address]  . Prothrombin Time 10/09/2020 14.7  11.4 - 15.2 seconds Final  . INR 10/09/2020 1.2  0.8 - 1.2 Final   Comment: (NOTE) INR goal varies based on device and  disease states. Performed at [facility], [place] [address], [place], [place] [address]   . aPTT 10/09/2020 31  24 - 36 seconds Final   Performed at [facility], [place] [address], [place], [place] [address]  . Specimen Description 10/09/2020 BLOOD RIGHT ANTECUBITAL   Final  . Special Requests 10/09/2020  BOTTLES DRAWN AEROBIC AND ANAEROBIC Blood Culture adequate volume   Final  . Culture 10/09/2020    Final                   Value:NO GROWTH 2 DAYS Performed at [facility], [place] [address], [place], [place] [address]   . Report Status 10/09/2020 PENDING   Incomplete  . Specimen Description 10/09/2020 BLOOD SITE NOT SPECIFIED   Final  . Special Requests 10/09/2020 BOTTLES DRAWN AEROBIC AND ANAEROBIC Blood Culture adequate volume   Final  . Culture 10/09/2020    Final                   Value:NO GROWTH 2 DAYS Performed at [facility], [place] [address], [place], [place] [address]   . Report Status 10/09/2020 PENDING   Incomplete  . Color, Urine 10/09/2020 YELLOW  YELLOW Final  . APPearance 10/09/2020 CLEAR  CLEAR Final  . Specific Gravity, Urine 10/09/2020 1.033* 1.005 - 1.030 Final  . pH 10/09/2020 5.0  5.0 - 8.0 Final  . Glucose, UA 10/09/2020 NEGATIVE  NEGATIVE mg/dL Final  . Hgb urine dipstick 10/09/2020 NEGATIVE  NEGATIVE Final  . Bilirubin Urine 10/09/2020 NEGATIVE  NEGATIVE Final  . Ketones, ur 10/09/2020 80* NEGATIVE mg/dL Final  . Protein, ur 10/09/2020 NEGATIVE  NEGATIVE mg/dL Final  . Nitrite 10/09/2020 NEGATIVE  NEGATIVE Final  . Chalmers Guest 10/09/2020 NEGATIVE  NEGATIVE Final   Performed at [facility], [place] [address] [place], [place] [address]  . Specimen Description 10/09/2020 IN/OUT CATH URINE   Final  . Special Requests 10/09/2020 NONE   Final  . Culture 10/09/2020    Final                   Value:NO GROWTH Performed at [facility], [place] [address] [place], [place] [address]   . Report Status 10/09/2020 10/10/2020 FINAL   Final  . Total Protein 10/09/2020 7.0  6.5 - 8.1 g/dL Final  . Albumin 10/09/2020 2.5* 3.5 - 5.0 g/dL Final  . AST 10/09/2020 12* 15 - 41 U/L Final  . ALT 10/09/2020 16  0 - 44 U/L Final  . Alkaline Phosphatase 10/09/2020 48  38 - 126 U/L Final  . Total Bilirubin 10/09/2020 1.0  0.3 - 1.2 mg/dL Final  . Bilirubin, Direct  10/09/2020 0.2  0.0 - 0.2 mg/dL Final  . Indirect Bilirubin 10/09/2020 0.8  0.3 - 0.9 mg/dL Final   Performed at [facility] [address] [place], [place] [address]  . Specimen Description 10/09/2020 SPUTUM   Final  . Special Requests 10/09/2020 NONE   Final  . Sputum evaluation 10/09/2020    Final                   Value:THIS SPECIMEN IS ACCEPTABLE FOR SPUTUM CULTURE Performed at [facility], [address], [place], [place] [address]   . Report Status 10/09/2020 10/09/2020 FINAL   Final  . L. pneumophila Serogp 1 Ur Ag 10/09/2020 Negative  Negative Final   Comment: (NOTE) Presumptive negative for L. pneumophila  serogroup 1 antigen in urine, suggesting no recent or current infection. Legionnaires' disease cannot be ruled out since other serogroups and species may also cause disease. Performed At: [facility] [place], [place] [id number] Rush Farmer MD NW:[id number]   . Source of Sample 10/09/2020 URINE, RANDOM   Final   Performed at [facility], [place] [address] [place] [address] [place], [place] [address]  . Strep Pneumo Urinary Antigen 10/09/2020 NEGATIVE  NEGATIVE Final   Comment:        Infection due to S. pneumoniae cannot be absolutely ruled out since the antigen present may be below the detection limit of the test. Performed at [facility], [address] [place], [place] [address]   . WBC 10/09/2020 32.6* 4.0 - 10.5 K/uL Final  . RBC 10/09/2020 4.98  4.22 - 5.81 MIL/uL Final  . Hemoglobin 10/09/2020 14.8  13.0 - 17.0 g/dL Final  . HCT 10/09/2020 44.0  39.0 - 52.0 % Final  . MCV 10/09/2020 88.4  80.0 - 100.0 fL Final  . MCH 10/09/2020 29.7  26.0 - 34.0 pg Final  . MCHC 10/09/2020 33.6  30.0 - 36.0 g/dL Final  . RDW 10/09/2020 12.8  11.5 - 15.5 % Final  . Platelets 10/09/2020 658* 150 - 400 K/uL Final  . nRBC 10/09/2020 0.0  0.0 - 0.2 % Final   Performed at [facility], [place] [address], [place], [place] [address]  . Sodium  10/09/2020 134* 135 - 145 mmol/L Final  . Potassium 10/09/2020 4.1  3.5 - 5.1 mmol/L Final  . Chloride 10/09/2020 98  98 - 111 mmol/L Final  . CO2 10/09/2020 23  22 - 32 mmol/L Final  . Glucose, Bld 10/09/2020 109* 70 - 99 mg/dL Final   Glucose reference range applies only to samples taken after fasting for at least 8 hours.  . BUN 10/09/2020 8  6 - 20 mg/dL Final  . Creatinine, Ser 10/09/2020 0.91  0.61 - 1.24 mg/dL Final  . Calcium 10/09/2020 8.2* 8.9 - 10.3 mg/dL Final  . GFR, Estimated 10/09/2020 >60  >60 mL/min Final   Comment: (NOTE) Calculated using the CKD-EPI Creatinine Equation (2021)   . Anion gap 10/09/2020 13  5 - 15 Final   Performed at [facility] [address] [place], [place] [address]  . Adenovirus 10/09/2020 NOT DETECTED  NOT DETECTED Final  . Coronavirus 229E 10/09/2020 NOT DETECTED  NOT DETECTED Final   Comment: (NOTE) The Coronavirus on the Respiratory Panel, DOES NOT test for the novel  Coronavirus (2019 nCoV)   . Coronavirus HKU1 10/09/2020 NOT DETECTED  NOT DETECTED Final  . Coronavirus NL63 10/09/2020 NOT DETECTED  NOT DETECTED Final  . Coronavirus OC43 10/09/2020 NOT DETECTED  NOT DETECTED Final  . Metapneumovirus 10/09/2020 NOT DETECTED  NOT DETECTED Final  . Rhinovirus / Enterovirus 10/09/2020 NOT DETECTED  NOT DETECTED Final  . Influenza A 10/09/2020 NOT DETECTED  NOT DETECTED Final  . Influenza B 10/09/2020 NOT DETECTED  NOT DETECTED Final  . Parainfluenza Virus 1 10/09/2020 NOT DETECTED  NOT DETECTED Final  . Parainfluenza Virus 2 10/09/2020 NOT DETECTED  NOT DETECTED Final  . Parainfluenza Virus 3 10/09/2020 NOT DETECTED  NOT DETECTED Final  . Parainfluenza Virus 4 10/09/2020 NOT DETECTED  NOT DETECTED Final  . Respiratory Syncytial Virus 10/09/2020 NOT DETECTED  NOT DETECTED Final  . Bordetella pertussis 10/09/2020 NOT DETECTED  NOT DETECTED Final  . Bordetella Parapertussis 10/09/2020 NOT DETECTED  NOT DETECTED Final  . Chlamydophila  pneumoniae 10/09/2020 NOT DETECTED  NOT DETECTED Final  . Mycoplasma pneumoniae 10/09/2020 NOT DETECTED  NOT DETECTED Final   Performed at [facility], [address] [place], [place] [address]  . B Natriuretic Peptide 10/09/2020 23.3  0.0 - 100.0 pg/mL Final   Performed at [facility], [place] [address], [place], [place] [address]  . Specimen Description 10/09/2020 SPUTUM   Final  . Special Requests 10/09/2020 NONE Reflexed from [id number]   Final  . Gram Stain 10/09/2020    Final                   Value:RARE WBC PRESENT, PREDOMINANTLY PMN RARE GRAM POSITIVE COCCI RARE GRAM NEGATIVE RODS   . Culture 10/09/2020    Final                   Value:MODERATE Normal respiratory flora-no Staph aureus or Pseudomonas seen Performed at [facility] [address] [place], [place] [address]   . Report Status 10/09/2020 10/11/2020 FINAL   Final  . MRSA by PCR 10/09/2020 NEGATIVE  NEGATIVE Final   Comment:        The GeneXpert MRSA Assay (FDA approved for NASAL specimens only), is one component of a comprehensive MRSA colonization surveillance program. It is not intended to diagnose MRSA infection nor to guide or monitor treatment for MRSA infections. Performed at [facility], [place] [address] [place], [place] [address]   . WBC 10/10/2020 17.3* 4.0 - 10.5 K/uL Final  . RBC 10/10/2020 4.10* 4.22 - 5.81 MIL/uL Final  . Hemoglobin 10/10/2020 12.4* 13.0 - 17.0 g/dL Final  . HCT 10/10/2020 36.0* 39.0 - 52.0 % Final  . MCV 10/10/2020 87.8  80.0 - 100.0 fL Final  . MCH 10/10/2020 30.2  26.0 - 34.0 pg Final  . MCHC 10/10/2020 34.4  30.0 - 36.0 g/dL Final  . RDW 10/10/2020 13.1  11.5 - 15.5 % Final  . Platelets 10/10/2020 540* 150 - 400 K/uL Final  . nRBC 10/10/2020 0.0  0.0 - 0.2 % Final   Performed at [facility] [address], [place], [place] [address]  . HIV Screen 4th Generation wRfx 10/10/2020 Non Reactive  Non Reactive Final   Performed at [facility], [place] [address] [place], [place] [address]  . WBC 10/11/2020 18.3* 4.0 - 10.5 K/uL Final  . RBC 10/11/2020 4.04* 4.22 - 5.81 MIL/uL Final  . Hemoglobin 10/11/2020 12.2* 13.0 - 17.0 g/dL Final  . HCT 10/11/2020 36.5* 39.0 - 52.0 % Final  . MCV 10/11/2020 90.3  80.0 - 100.0 fL Final  . MCH 10/11/2020 30.2  26.0 - 34.0 pg Final  . MCHC 10/11/2020 33.4  30.0 - 36.0 g/dL Final  . RDW 10/11/2020 13.3  11.5 - 15.5 % Final  . Platelets 10/11/2020 560* 150 - 400 K/uL Final  . nRBC 10/11/2020 0.0  0.0 - 0.2 % Final  . Neutrophils Relative % 10/11/2020 41  % Final  . Neutro Abs 10/11/2020 7.5  1.7 - 7.7 K/uL Final  . Lymphocytes Relative 10/11/2020 15  % Final  . Lymphs Abs 10/11/2020 2.7  0.7 - 4.0 K/uL Final  . Monocytes Relative 10/11/2020 6  % Final  . Monocytes Absolute 10/11/2020 1.1* 0.1 - 1.0 K/uL Final  . Eosinophils Relative 10/11/2020 37  % Final  . Eosinophils Absolute 10/11/2020 6.8* 0.0 - 0.5 K/uL Final  .  Basophils Relative 10/11/2020 1  % Final  . Basophils Absolute 10/11/2020 0.2* 0.0 - 0.1 K/uL Final  . nRBC 10/11/2020 0  0 /100 WBC Final  . Abs Immature Granulocytes 10/11/2020 0.00  0.00 - 0.07 K/uL Final  . Polychromasia 10/11/2020 PRESENT   Final   Performed at [facility], [place] [address], [place], [place] [address]  . Sodium 10/11/2020 142  135 - 145 mmol/L Final  . Potassium 10/11/2020 3.8  3.5 - 5.1 mmol/L Final  . Chloride 10/11/2020 111  98 - 111 mmol/L Final  . CO2 10/11/2020 23  22 - 32 mmol/L Final  . Glucose, Bld 10/11/2020 103* 70 - 99 mg/dL Final   Glucose reference range applies only to samples taken after fasting for at least 8 hours.  . BUN 10/11/2020 14  6 - 20 mg/dL Final  . Creatinine, Ser 10/11/2020 0.72  0.61 - 1.24 mg/dL Final  . Calcium 10/11/2020 8.2* 8.9 - 10.3 mg/dL Final  . Total Protein 10/11/2020 5.6* 6.5 - 8.1 g/dL Final  . Albumin 10/11/2020 2.2* 3.5 - 5.0 g/dL Final  . AST 10/11/2020 27  15 - 41 U/L Final  . ALT 10/11/2020 37  0 - 44  U/L Final  . Alkaline Phosphatase 10/11/2020 40  38 - 126 U/L Final  . Total Bilirubin 10/11/2020 0.4  0.3 - 1.2 mg/dL Final  . GFR, Estimated 10/11/2020 >60  >60 mL/min Final   Comment: (NOTE) Calculated using the CKD-EPI Creatinine Equation (2021)   . Anion gap 10/11/2020 8  5 - 15 Final   Performed at [facility] [address] [place], [place] [address]  . Magnesium 10/11/2020 1.9  1.7 - 2.4 mg/dL Final   Performed at [facility] [address] [place], [place] [address]  . Phosphorus 10/11/2020 4.1  2.5 - 4.6 mg/dL Final   Performed at [facility] [address], [place], [place] [address]    (this displays the last labs from the last 3 days)  No results found for: TOTALPROTELP, ALBUMINELP, A1GS, A2GS, BETS, BETA2SER, GAMS, MSPIKE, SPEI (this displays SPEP labs)  No results found for: KPAFRELGTCHN, LAMBDASER, KAPLAMBRATIO (kappa/lambda light chains)  No results found for: HGBA, HGBA2QUANT, HGBFQUANT, HGBSQUAN (Hemoglobinopathy evaluation)   No results found for: LDH  No results found for: IRON, TIBC, IRONPCTSAT (Iron and TIBC)  No results found for: FERRITIN  Urinalysis    Component Value Date/Time   COLORURINE YELLOW 10/09/2020 0320   APPEARANCEUR CLEAR 10/09/2020 0320   LABSPEC 1.033 (H) 10/09/2020 0320   PHURINE 5.0 10/09/2020 0320   GLUCOSEU NEGATIVE 10/09/2020 0320   HGBUR NEGATIVE 10/09/2020 0320   BILIRUBINUR NEGATIVE 10/09/2020 0320   KETONESUR 80 (A) 10/09/2020 0320   PROTEINUR NEGATIVE 10/09/2020 0320   NITRITE NEGATIVE 10/09/2020 0320   LEUKOCYTESUR NEGATIVE 10/09/2020 0320     STUDIES: DG Chest 2 View  Result Date: 10/08/2020 CLINICAL DATA:  Cough, shortness of breath EXAM: CHEST - 2 VIEW COMPARISON:  09/11/2020 FINDINGS: Consolidation seen throughout the right lung. Patchy left perihilar airspace opacities. Findings compatible with pneumonia. Heart is normal size. No effusions or acute bony abnormality. IMPRESSION: Patchy  airspace disease throughout the right lung and in the left perihilar region compatible with multifocal pneumonia. Electronically Signed   By: Rolm Baptise M.D.   On: 10/08/2020 22:32   CT Angio Chest PE W and/or Wo Contrast  Result Date: 10/09/2020 CLINICAL DATA:  Chest pain, shortness of breath EXAM:  CT ANGIOGRAPHY CHEST WITH CONTRAST TECHNIQUE: Multidetector CT imaging of the chest was performed using the standard protocol during bolus administration of intravenous contrast. Multiplanar CT image reconstructions and MIPs were obtained to evaluate the vascular anatomy. CONTRAST:  57m OMNIPAQUE IOHEXOL 350 MG/ML SOLN COMPARISON:  Chest x-ray 10/08/2020 FINDINGS: Cardiovascular: Suboptimal opacification of the pulmonary arteries. No large central pulmonary emboli. Insert Heart. Mediastinum/Nodes: Prominent mediastinal and bilateral hilar lymph nodes, likely reactive. No axillary adenopathy. Lungs/Pleura: Extensive airspace disease throughout the right upper lobe and right lower lobe. Patchy perihilar opacities in the left lung, predominantly lingula. No effusions. Upper Abdomen: Imaging into the upper abdomen demonstrates no acute findings. Musculoskeletal: Chest wall soft tissues are unremarkable. No acute bony abnormality. Review of the MIP images confirms the above findings. IMPRESSION: Bilateral airspace disease, right greater than left most compatible with multifocal pneumonia. Suboptimal opacification of pulmonary arteries. No large central pulmonary emboli. Electronically Signed   By: KRolm BaptiseM.D.   On: 10/09/2020 02:51    ASSESSMENT/PLAN: 40y.o. SMelletteman presenting 10/08/2021 with fever, bilateral pulmonary infiltrates R>L, and peripheral blood eosinophilia  I discussed with the patient the difference between allergy and inflammation and the different types of white cells making up the immune system. He understands eosinophils are generally increased in allergic reactions and in fighting  some infections, especially parasites. They are also increased in some malignancies such as lymphoma. In those cases, the elevated number of eos is reactive--the eos play a part but are not the drivers of the problem. In other conditions, the eos themselves are the problem. Two of those conditions are leukemia and Churg Strauss vasculitis.  Given his history of (mild but persistent) asthma I favor a reactive eosinophilia. Note he had extensive sinus surgery DEC 2021 and though I have not been able to access the pathology report, the patient tells me he met with the ENT surgeon some weeks after the procedure and was told "the tissue looked all right." If we were dealing with CErick AlleyI wuld expect they would have found evidence of vasculitis. Also review of the peripheral blood film 10/08/2020 does not suggest leukemia. Finally white CT angio shows some mediastinal/hilar adenopathy this is c/w with a pneumonia or pneumonitis and is not in itself suggestive of lymphoma.  Appropriate labwork for CErick Alleyhas already been sent. This disease of course is difficult to either prove or exclude. I have no additional tests to suggest but will follow those results peripherally with you.  SHoyappears to be improving and hopefully he may return home. He already has an appt with Dr HSilas Floodin pMinneiska05/08/2020 and he will keep that. If his condition deteriorates however I would favor inpatient pulmonary consultation with a view to BAL of the right side, which is clearly the most involved  Appreciate consulting on this case. I am not planning on outpatient follow-up in hematology for SAiddenbut will be glad to become involved in his care as needed in the future.   I spent approximately 60 minutes face to face with SSadaowith more than 50% of that time spent in counseling and coordination of care. Specifically we reviewed the biology of the patient's diagnosis and the specifics of her situation.       SAlgiehas a good understanding of the overall plan. She agrees with it. She knows the goal of treatment in her case is cure. She will call with any problems that may develop before her next visit here.  Chauncey Cruel, MD   10/11/2020 5:27 PM Medical Oncology and Hematology [facility] [address] [place], [place] [address] Tel. [phone]    Fax. [phone]

## 2020-10-11 NOTE — Progress Notes (Signed)
PROGRESS NOTE    Tracy Pugh  IRW:[id number] DOB: 1981-04-11 DOA: 10/08/2020 PCP: Patient, No Pcp Per (Inactive)   Chief Complaint  Patient presents with  . Shortness of Breath   Brief Narrative:  40 y.o. male with history of exercise-induced asthma only had COVID-19 infection in January 2022 about 3 months ago was admitted in March last month for pneumonia and discharged on September 14, 2020.  He's been readmitted on 4/18 with recurrent vs nonresolving pneumonia.  Assessment & Plan:   Principal Problem:   Multifocal pneumonia Active Problems:   Mild persistent asthma without complication  Multifocal pneumonia with sepsis, acute respiratory failure - recurrent vs non resolved? - per discussion, not sure he ever was completely back to his baseline after recent discharge - leukocytosis improved from admission, but persistent due to steroids below - sputum culture has been obtained -> normal resp flora - Strep pneumo antigen is negative - legionella negative - Respiratory panel influenza and COVID is negative - Blood cultures negative to date x2 - slowly improving, continue to follow  - if not improving, needs pulmonary consult -> likely needs pulmonary follow up at discharge if he improves to the point that he's appropriate for discharge without consult here given his persistent pneumonia.  Asthma exacerbation-history of mild persistent asthma without complication -Continue IV Solu-Medrol, Breo Ellipta, DuoNebs every 4 hours and albuterol nebs every 2 as needed  Dehydration - improving, follow   Prior sinus issues with polyps - s/p surgery in 12/21 - no longer using Azelastine-fluticicone nasal spray  DVT prophylaxis: lovenox Code Status: full  Family Communication: father at bedside Disposition:   Status is: Inpatient  Remains inpatient appropriate because:Inpatient level of care appropriate due to severity of illness   Dispo: The patient is from: Home               Anticipated d/c is to: Home              Patient currently is not medically stable to d/c.   Difficult to place patient No   Consultants:   none  Procedures:   none  Antimicrobials:  Anti-infectives (From admission, onward)   Start     Dose/Rate Route Frequency Ordered Stop   10/09/20 1000  vancomycin (VANCOREADY) IVPB 750 mg/150 mL  Status:  Discontinued        750 mg 150 mL/hr over 60 Minutes Intravenous Every 8 hours 10/09/20 0350 10/09/20 1129   10/09/20 0800  cefTRIAXone (ROCEPHIN) 2 g in sodium chloride 0.9 % 100 mL IVPB        2 g 200 mL/hr over 30 Minutes Intravenous Every 24 hours 10/09/20 0339 10/14/20 0759   10/09/20 0345  azithromycin (ZITHROMAX) 500 mg in sodium chloride 0.9 % 250 mL IVPB        500 mg 250 mL/hr over 60 Minutes Intravenous Daily 10/09/20 0339 10/14/20 0559   10/09/20 0115  vancomycin (VANCOREADY) IVPB 1000 mg/200 mL  Status:  Discontinued        1,000 mg 200 mL/hr over 60 Minutes Intravenous  Once 10/09/20 0107 10/09/20 0110   10/09/20 0115  ceFEPIme (MAXIPIME) 2 g in sodium chloride 0.9 % 100 mL IVPB        2 g 200 mL/hr over 30 Minutes Intravenous  Once 10/09/20 0107 10/09/20 0209   10/09/20 0115  vancomycin (VANCOREADY) IVPB 1500 mg/300 mL        1,500 mg 150 mL/hr over 120 Minutes Intravenous  Once 10/09/20 0110  10/09/20 0413     Subjective: Slight improvement day to day Continued exertion with activity, cough   Objective: Vitals:   10/11/20 0801 10/11/20 1047 10/11/20 1412 10/11/20 1502  BP:  110/75    Pulse:  80    Resp:  18    Temp:  98 F (36.7 C)    TempSrc:  Oral    SpO2: 93% 96% 97% 94%  Weight:      Height:        Intake/Output Summary (Last 24 hours) at 10/11/2020 1513 Last data filed at 10/11/2020 0756 Gross per 24 hour  Intake 719.99 ml  Output 550 ml  Net 169.99 ml   Filed Weights   10/09/20 0923 10/10/20 0321 10/11/20 0512  Weight: 73.7 kg 73.1 kg 72.5 kg    Examination:  General: No acute  distress. Cardiovascular: Heart sounds show Tracy Pugh regular rate, and rhythm Lungs: diminished breath sounds, scattered faint wheezing Abdomen: Soft, nontender, nondistended  Neurological: Alert and oriented 3. Moves all extremities 4 . Cranial nerves II through XII grossly intact. Skin: Warm and dry. No rashes or lesions. Extremities: No clubbing or cyanosis. No edema.   Data Reviewed: I have personally reviewed following labs and imaging studies  CBC: Recent Labs  Lab 10/08/20 2211 10/09/20 0339 10/10/20 0419 10/11/20 0436  WBC 32.3* 32.6* 17.3* 18.3*  NEUTROABS 8.1*  --   --  7.5  HGB 14.7 14.8 12.4* 12.2*  HCT 41.9 44.0 36.0* 36.5*  MCV 85.9 88.4 87.8 90.3  PLT 584* 658* 540* 560*    Basic Metabolic Panel: Recent Labs  Lab 10/08/20 2211 10/09/20 0339 10/11/20 0436  NA 133* 134* 142  K 3.2* 4.1 3.8  CL 100 98 111  CO2 23 23 23   GLUCOSE 103* 109* 103*  BUN 8 8 14   CREATININE 0.95 0.91 0.72  CALCIUM 7.9* 8.2* 8.2*  MG  --   --  1.9  PHOS  --   --  4.1    GFR: Estimated Creatinine Clearance: 114.1 mL/min (by C-G formula based on SCr of 0.72 mg/dL).  Liver Function Tests: Recent Labs  Lab 10/09/20 0107 10/11/20 0436  AST 12* 27  ALT 16 37  ALKPHOS 48 40  BILITOT 1.0 0.4  PROT 7.0 5.6*  ALBUMIN 2.5* 2.2*    CBG: No results for input(s): GLUCAP in the last 168 hours.   Recent Results (from the past 240 hour(s))  Resp Panel by RT-PCR (Flu Tracy Pugh&B, Covid) Nasopharyngeal Swab     Status: None   Collection Time: 10/09/20  1:07 AM   Specimen: Nasopharyngeal Swab; Nasopharyngeal(NP) swabs in vial transport medium  Result Value Ref Range Status   SARS Coronavirus 2 by RT PCR NEGATIVE NEGATIVE Final    Comment: (NOTE) SARS-CoV-2 target nucleic acids are NOT DETECTED.  The SARS-CoV-2 RNA is generally detectable in upper respiratory specimens during the acute phase of infection. The lowest concentration of SARS-CoV-2 viral copies this assay can detect is 138  copies/mL. Tracy Pugh negative result does not preclude SARS-Cov-2 infection and should not be used as the sole basis for treatment or other patient management decisions. Tracy Pugh negative result may occur with  improper specimen collection/handling, submission of specimen other than nasopharyngeal swab, presence of viral mutation(s) within the areas targeted by this assay, and inadequate number of viral copies(<138 copies/mL). Monteen Toops negative result must be combined with clinical observations, patient history, and epidemiological information. The expected result is Negative.  Fact Sheet for Patients:  BloggerCourse.comhttps://www.fda.gov/media/152166/download  Fact Sheet  for Healthcare Providers:  SeriousBroker.it  This test is no t yet approved or cleared by the [place] and  has been authorized for detection and/or diagnosis of SARS-CoV-2 by FDA under an Emergency Use Authorization (EUA). This EUA will remain  in effect (meaning this test can be used) for the duration of the COVID-19 declaration under Section 564(b)(1) of the Act, 21 U.S.C.section 360bbb-3(b)(1), unless the authorization is terminated  or revoked sooner.       Influenza Margia Wiesen by PCR NEGATIVE NEGATIVE Final   Influenza B by PCR NEGATIVE NEGATIVE Final    Comment: (NOTE) The Xpert Xpress SARS-CoV-2/FLU/RSV plus assay is intended as an aid in the diagnosis of influenza from Nasopharyngeal swab specimens and should not be used as Hyacinth Marcelli sole basis for treatment. Nasal washings and aspirates are unacceptable for Xpert Xpress SARS-CoV-2/FLU/RSV testing.  Fact Sheet for Patients: BloggerCourse.com  Fact Sheet for Healthcare Providers: SeriousBroker.it  This test is not yet approved or cleared by the [place] FDA and has been authorized for detection and/or diagnosis of SARS-CoV-2 by FDA under an Emergency Use Authorization (EUA). This EUA will remain in effect (meaning  this test can be used) for the duration of the COVID-19 declaration under Section 564(b)(1) of the Act, 21 U.S.C. section 360bbb-3(b)(1), unless the authorization is terminated or revoked.  Performed at [facility], [address], [place], [place] [address]   Blood Culture (routine x 2)     Status: None (Preliminary result)   Collection Time: 10/09/20  1:07 AM   Specimen: BLOOD  Result Value Ref Range Status   Specimen Description BLOOD RIGHT ANTECUBITAL  Final   Special Requests   Final    BOTTLES DRAWN AEROBIC AND ANAEROBIC Blood Culture adequate volume   Culture   Final    NO GROWTH 2 DAYS Performed at [facility], [address] [place], [place] [address]    Report Status PENDING  Incomplete  Blood Culture (routine x 2)     Status: None (Preliminary result)   Collection Time: 10/09/20  2:19 AM   Specimen: BLOOD  Result Value Ref Range Status   Specimen Description BLOOD SITE NOT SPECIFIED  Final   Special Requests   Final    BOTTLES DRAWN AEROBIC AND ANAEROBIC Blood Culture adequate volume   Culture   Final    NO GROWTH 2 DAYS Performed at [facility], [address], [place], [place] [address]    Report Status PENDING  Incomplete  Urine culture     Status: None   Collection Time: 10/09/20  3:14 AM   Specimen: In/Out Cath Urine  Result Value Ref Range Status   Specimen Description IN/OUT CATH URINE  Final   Special Requests NONE  Final   Culture   Final    NO GROWTH Performed at [facility], [address] [place], [place] [address]    Report Status 10/10/2020 FINAL  Final  Respiratory (~20 pathogens) panel by PCR     Status: None   Collection Time: 10/09/20  3:41 AM   Specimen: Nasopharyngeal Swab; Respiratory  Result Value Ref Range Status   Adenovirus NOT DETECTED NOT DETECTED Final   Coronavirus 229E NOT DETECTED NOT DETECTED Final    Comment: (NOTE) The Coronavirus on the Respiratory Panel, DOES NOT test for the novel   Coronavirus (2019 nCoV)    Coronavirus HKU1 NOT DETECTED NOT DETECTED Final   Coronavirus NL63 NOT DETECTED NOT DETECTED  Final   Coronavirus OC43 NOT DETECTED NOT DETECTED Final   Metapneumovirus NOT DETECTED NOT DETECTED Final   Rhinovirus / Enterovirus NOT DETECTED NOT DETECTED Final   Influenza Suellyn Meenan NOT DETECTED NOT DETECTED Final   Influenza B NOT DETECTED NOT DETECTED Final   Parainfluenza Virus 1 NOT DETECTED NOT DETECTED Final   Parainfluenza Virus 2 NOT DETECTED NOT DETECTED Final   Parainfluenza Virus 3 NOT DETECTED NOT DETECTED Final   Parainfluenza Virus 4 NOT DETECTED NOT DETECTED Final   Respiratory Syncytial Virus NOT DETECTED NOT DETECTED Final   Bordetella pertussis NOT DETECTED NOT DETECTED Final   Bordetella Parapertussis NOT DETECTED NOT DETECTED Final   Chlamydophila pneumoniae NOT DETECTED NOT DETECTED Final   Mycoplasma pneumoniae NOT DETECTED NOT DETECTED Final    Comment: Performed at [facility], [address] [place], [place] [address]  Culture, sputum-assessment     Status: None   Collection Time: 10/09/20  5:19 AM   Specimen: Sputum  Result Value Ref Range Status   Specimen Description SPUTUM  Final   Special Requests NONE  Final   Sputum evaluation   Final    THIS SPECIMEN IS ACCEPTABLE FOR SPUTUM CULTURE Performed at [facility], [address], [place], [place] [address]    Report Status 10/09/2020 FINAL  Final  Culture, Respiratory w Gram Stain     Status: None   Collection Time: 10/09/20  5:19 AM   Specimen: SPU  Result Value Ref Range Status   Specimen Description SPUTUM  Final   Special Requests NONE Reflexed from [id number]  Final   Gram Stain   Final    RARE WBC PRESENT, PREDOMINANTLY PMN RARE GRAM POSITIVE COCCI RARE GRAM NEGATIVE RODS    Culture   Final    MODERATE Normal respiratory flora-no Staph aureus or Pseudomonas seen Performed at [facility], [address], [place], [place] [address]    Report Status  10/11/2020 FINAL  Final  MRSA PCR Screening     Status: None   Collection Time: 10/09/20  7:47 AM   Specimen: Nasal Mucosa; Nasopharyngeal  Result Value Ref Range Status   MRSA by PCR NEGATIVE NEGATIVE Final    Comment:        The GeneXpert MRSA Assay (FDA approved for NASAL specimens only), is one component of Phuong Hillary comprehensive MRSA colonization surveillance program. It is not intended to diagnose MRSA infection nor to guide or monitor treatment for MRSA infections. Performed at [facility], [address] [place], [place] [address]          Radiology Studies: No results found.      Scheduled Meds: . enoxaparin (LOVENOX) injection  40 mg Subcutaneous Q24H  . fluticasone furoate-vilanterol  1 puff Inhalation Daily  . ipratropium-albuterol  3 mL Nebulization Q6H  . predniSONE  40 mg Oral Q breakfast   Continuous Infusions: . azithromycin 500 mg (10/11/20 0756)  . cefTRIAXone (ROCEPHIN)  IV 2 g (10/11/20 0915)     LOS: 2 days    Time spent: over 30 min    Lacretia Nicks, MD [facility]   To contact the attending provider between 7A-7P or the covering provider during after hours 7P-7A, please log into the web site [email or web address] and access using universal [place] password for that web site. If you do not have the password, please call the hospital operator.  10/11/2020, 3:13 PM

## 2020-10-11 NOTE — Plan of Care (Signed)
  Problem: Education: Goal: Knowledge of General Education information will improve Description: Including pain rating scale, medication(s)/side effects and non-pharmacologic comfort measures Outcome: Progressing   Problem: Clinical Measurements: Goal: Ability to maintain clinical measurements within normal limits will improve Outcome: Progressing Goal: Will remain free from infection Outcome: Progressing   

## 2020-10-11 NOTE — H&P (View-Only) (Signed)
NAME:  Tracy Pugh, MRN:  [id number], DOB:  1980/10/15, LOS: 2 ADMISSION DATE:  10/08/2020, CONSULTATION DATE:  10/11/20 REFERRING MD:  Florene Glen- [facility], CHIEF COMPLAINT:  Hypoxia   History of Present Illness:  [name] is a 40 y/o male with a history of previously well-controlled exercise-induced asthma and sinus polyps requiring sinus surgery 06/18/20 on prednisone taper until 06/2020, COVID PNA 06/2020 who was admitted for worsening SOB and persistent CXR abnormalities. He began having SOB and wheezing on 08/27/20 and was seen at urgent care. CXr at that time demonstrated an opacity and he was given azithromycin & steroids and had improvement in his symptoms. He had recurrence of his symptoms in late March and was hospitalized 3/21-3/24 for an asthma exacerbation/pneumonia treated with steroids, abx, bronchodilators admitted with recurrent pneumonia.  He has been on Breo as an outpatient.   On 4/18 he presented to the ED with complaints of shortness of breath, right sided chest pain, wheezing, coughing, band-like tightness around his chest, and fever 101 x 1 day. He also had an allergic reaction with tongue swelling and vomiting the day he presented which he attributes to eating something that contained sulfites. He has had similar but less severe reactions to sulfites in the past.  COVID/Flu negative. On evaluation in the ED, patient was found to be hypoxic on room and placed on Carbon. Labs notable for  WBC 32,000, plt 584, eos 61 on diff. UA negative. Strep pneumo and legionella negative. Respiratory culture obtained with rare GPC, GNR. CTA Chest with no evidence of pulmonary embolism, + bilateral airspace disease with right greater than left concerning for multifocal pneumonia. He was started on Cefepime, Vanco and Azithro 4/18 and transitioned to Rocephin/Azithro 4/19.  Since admission, his leukocytosis and oxygen requirements have improved, currently on room air.  He remains on prednisone taper (currently  at 40 mg daily).   Family history negative for pulmonary disease, mother has RA. 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 He always wore his PPE, but reports he could occasionally be having low-level exposure to carbon fiber, fiberglass, acetone, MEK, MPK. He was around sheetrock dust a few months ago, but not recently.  He denies rash other than recent athletes foot that resolved with treatment. He has noticed that he has more allergy symptoms than he used to-- if he cuts the grass without a mask he can barely breathe for 2 hours, but if he wears a mask he is fine.    Pertinent  Medical History  Sinusitis -s/p bilateral endoscopic sinus surgery with nasal polypectomy, septoplasty, inferior turbinate reduction and excision of left concha bullosa 06/18/20 COVID PNA 06/2020  Significant Hospital Events: Including procedures, antibiotic start and stop dates in addition to other pertinent events   . 4/18 Admission with multifocal pneumonia, started on azithromycin, vanc, cefepime . 4/19 cefepime deescalated to ceftriaxone . 4/20 vanc stopped  Interim History / Subjective:    Objective   Blood pressure 110/75, pulse 80, temperature 98 F (36.7 C), temperature source Oral, resp. rate 18, height 5' 6" (1.676 m), weight 72.5 kg, SpO2 94 %.    FiO2 (%):  [21 %-28 %] 21 %   Intake/Output Summary (Last 24 hours) at 10/11/2020 1906 Last data filed at 10/11/2020 1836 Gross per 24 hour  Intake 740 ml  Output 1450 ml  Net -710 ml   Filed Weights   10/09/20 0923 10/10/20 0321 10/11/20  6010  Weight: 73.7 kg 73.1 kg 72.5 kg    Examination: General: healthy appearing young man sitting up at the bedside in NAD HENT: [place]/AT, eyes anicteric Lungs: scattered wheezing throughout, frequent dry cough during forceful inhalation maneuvers. Speaking in full sentences, no conversational dyspnea. On RA. Cardiovascular: S1S2, RRR Abdomen:  nondistended, soft Extremities: no edema, no cyanosis or clubbing, no palmar lesions Neuro: awake and alert, moving all extremities spontaneously Derm: skin warm, dry, no rashes  Labs/imaging that I havepersonally reviewed  (right click and "Reselect all SmartList Selections" daily)  WBC  33> 17>18 (eos 6.8)  UA negative for RBCs/ Hb BNP 23  CT: multifocal airspace opacities centrally, R>L, but involving all lobes of the lungs.    Resolved Hospital Problem list     Assessment & Plan:  Multifocal pneumonia -  EGPA (doesn't meet ACR criteria at this time) vs hypereosinophilic syndrome vs parasitic infection vs CEP. Agree that this is likely reactive rather than primary or monoclonal eosinophilia. acute hypoxic respiratory failure improved- currently off oxygen Acute asthma exacerbation; concern for secondary cause of asthma -Bronchscopy tomorrow with BAL; unfortunately transbronchial biopsy would likely be low yield for COP, CEP, or vasculitis. -Serologies sent: ANCA w/ titers, MPO & PR-3, IgE level, IgG4 subclasses, EBV, HIV,ESR, CRP, strongyloides Ab,  -Parasite screen -holding steroids for now until after bronch -con't LABA-ICS daily -albuterol Q4h PRN -NPO past midnight for bronch tomorrow -Appreciate hematology's input.   Labs   CBC: Recent Labs  Lab 10/08/20 2211 10/09/20 0339 10/10/20 0419 10/11/20 0436  WBC 32.3* 32.6* 17.3* 18.3*  NEUTROABS 8.1*  --   --  7.5  HGB 14.7 14.8 12.4* 12.2*  HCT 41.9 44.0 36.0* 36.5*  MCV 85.9 88.4 87.8 90.3  PLT 584* 658* 540* 560*    Basic Metabolic Panel: Recent Labs  Lab 10/08/20 2211 10/09/20 0339 10/11/20 0436  NA 133* 134* 142  K 3.2* 4.1 3.8  CL 100 98 111  CO2 _0 GLUCOSE 103* 109* 103*  BUN _1 CREATININE 0.95 0.91 0.72  CALCIUM 7.9* 8.2* 8.2*  MG  --   --  1.9  PHOS  --   --  4.1   GFR: Estimated Creatinine Clearance: 114.1 mL/min (by C-G formula based on SCr of 0.72 mg/dL). Recent Labs  Lab  10/08/20 2211 10/09/20 0134 10/09/20 0307 10/09/20 0339 10/10/20 0419 10/11/20 0436  WBC 32.3*  --   --  32.6* 17.3* 18.3*  LATICACIDVEN  --  1.2 1.5  --   --   --     Liver Function Tests: Recent Labs  Lab 10/09/20 0107 10/11/20 0436  AST 12* 27  ALT 16 37  ALKPHOS 48 40  BILITOT 1.0 0.4  PROT 7.0 5.6*  ALBUMIN 2.5* 2.2*   No results for input(s): LIPASE, AMYLASE in the last 168 hours. No results for input(s): AMMONIA in the last 168 hours.  ABG No results found for: PHART, PCO2ART, PO2ART, HCO3, TCO2, ACIDBASEDEF, O2SAT   Coagulation Profile: Recent Labs  Lab 10/09/20 0107  INR 1.2    Cardiac Enzymes: No results for input(s): CKTOTAL, CKMB, CKMBINDEX, TROPONINI in the last 168 hours.  HbA1C: No results found for: HGBA1C  CBG: No results for input(s): GLUCAP in the last 168 hours.  Review of Systems:   Review of Systems  Constitutional: Positive for fever and weight loss. Negative for chills.       Poor appetite  HENT: Negative for congestion and sore  throat.   Eyes: Negative.   Respiratory: Positive for cough, shortness of breath and wheezing. Negative for sputum production.   Cardiovascular: Negative for chest pain and leg swelling.  Gastrointestinal: Negative for diarrhea, heartburn, nausea and vomiting.  Genitourinary: Negative for hematuria.  Musculoskeletal: Negative for joint pain and myalgias.  Neurological: Negative for tingling, speech change and focal weakness.  Endo/Heme/Allergies: Positive for environmental allergies.     Past Medical History:  He,  has a past medical history of Asthma.   Surgical History:   Past Surgical History:  Procedure Laterality Date  . sinus polyp surgery       Social History:   reports that he quit smoking about 3 years ago. He has never used smokeless tobacco. He reports current alcohol use. He reports that he does not use drugs.   Family History:  His family history includes Rheum arthritis in his  mother.   Allergies No Known Allergies   Home Medications  Prior to Admission medications   Medication Sig Start Date End Date Taking? Authorizing Provider  albuterol (VENTOLIN HFA) 108 (90 Base) MCG/ACT inhaler INHALE 2 PUFFS INTO THE LUNGS EVERY 6 (SIX) HOURS AS NEEDED FOR WHEEZING OR SHORTNESS OF BREATH. 09/14/20 09/14/21 Yes Ghimire, Henreitta Leber, MD  Azelastine-Fluticasone 137-50 MCG/ACT SUSP Place 1 spray into the nose in the morning and at bedtime. Patient taking differently: Place 1 spray into the nose 2 (two) times daily as needed (sinus issues). 02/28/20  Yes Garnet Sierras, DO  cetirizine (ZYRTEC) 10 MG tablet Take 10 mg by mouth daily.   Yes [provider]  fluticasone furoate-vilanterol (BREO ELLIPTA) 200-25 MCG/INH AEPB Inhale 1 puff into the lungs daily. 09/14/20 10/14/20 Yes Ghimire, Henreitta Leber, MD  sodium chloride (OCEAN) 0.65 % SOLN nasal spray Place 1 spray into both nostrils as needed for congestion. 03/13/20  Yes Couture, Cortni S, PA-C  predniSONE (DELTASONE) 10 MG tablet Take 40 mg daily for 1 day, 30 mg daily for 1 day, 20 mg daily for 1 days,10 mg daily for 1 day, then stop Patient not taking: Reported on 10/09/2020 09/14/20   Jonetta Osgood, MD       Julian Hy, DO 10/11/20 7:06 PM [facility]

## 2020-10-11 NOTE — Consult Note (Signed)
NAME:  Tracy Pugh, MRN:  [id number], DOB:  21-Apr-1981, LOS: 2 ADMISSION DATE:  10/08/2020, CONSULTATION DATE:  10/11/20 REFERRING MD:  Tracy Pugh- [facility], CHIEF COMPLAINT:  Hypoxia   History of Present Illness:  Tracy Pugh is a 40 y/o male with a history of previously well-controlled exercise-induced asthma and sinus polyps requiring sinus surgery 06/18/20 on prednisone taper until 06/2020, COVID PNA 06/2020 who was admitted for worsening SOB and persistent CXR abnormalities. He began having SOB and wheezing on 08/27/20 and was seen at urgent care. CXr at that time demonstrated an opacity and he was given azithromycin & steroids and had improvement in his symptoms. He had recurrence of his symptoms in late March and was hospitalized 3/21-3/24 for an asthma exacerbation/pneumonia treated with steroids, abx, bronchodilators admitted with recurrent pneumonia.  He has been on Breo as an outpatient.   On 4/18 he presented to the ED with complaints of shortness of breath, right sided chest pain, wheezing, coughing, band-like tightness around his chest, and fever 101 x 1 day. He also had an allergic reaction with tongue swelling and vomiting the day he presented which he attributes to eating something that contained sulfites. He has had similar but less severe reactions to sulfites in the past.  COVID/Flu negative. On evaluation in the ED, patient was found to be hypoxic on room and placed on Carbon. Labs notable for  WBC 32,000, plt 584, eos 61 on diff. UA negative. Strep pneumo and legionella negative. Respiratory culture obtained with rare GPC, GNR. CTA Chest with no evidence of pulmonary embolism, + bilateral airspace disease with right greater than left concerning for multifocal pneumonia. He was started on Cefepime, Vanco and Azithro 4/18 and transitioned to Rocephin/Azithro 4/19.  Since admission, his leukocytosis and oxygen requirements have improved, currently on room air.  He remains on prednisone taper (currently  at 40 mg daily).   Family history negative for pulmonary disease, mother has RA. 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 He always wore his PPE, but reports he could occasionally be having low-level exposure to carbon fiber, fiberglass, acetone, MEK, MPK. He was around sheetrock dust a few months ago, but not recently.  He denies rash other than recent athletes foot that resolved with treatment. He has noticed that he has more allergy symptoms than he used to-- if he cuts the grass without a mask he can barely breathe for 2 hours, but if he wears a mask he is fine.    Pertinent  Medical History  Sinusitis -s/p bilateral endoscopic sinus surgery with nasal polypectomy, septoplasty, inferior turbinate reduction and excision of left concha bullosa 06/18/20 COVID PNA 06/2020  Significant Hospital Events: Including procedures, antibiotic start and stop dates in addition to other pertinent events   . 4/18 Admission with multifocal pneumonia, started on azithromycin, vanc, cefepime . 4/19 cefepime deescalated to ceftriaxone . 4/20 vanc stopped  Interim History / Subjective:    Objective   Blood pressure 110/75, pulse 80, temperature 98 F (36.7 C), temperature source Oral, resp. rate 18, height 5' 6" (1.676 m), weight 72.5 kg, SpO2 94 %.    FiO2 (%):  [21 %-28 %] 21 %   Intake/Output Summary (Last 24 hours) at 10/11/2020 1906 Last data filed at 10/11/2020 1836 Gross per 24 hour  Intake 740 ml  Output 1450 ml  Net -710 ml   Filed Weights   10/09/20 0923 10/10/20 0321 10/11/20  6010  Weight: 73.7 kg 73.1 kg 72.5 kg    Examination: General: healthy appearing young man sitting up at the bedside in NAD HENT: [place]/AT, eyes anicteric Lungs: scattered wheezing throughout, frequent dry cough during forceful inhalation maneuvers. Speaking in full sentences, no conversational dyspnea. On RA. Cardiovascular: S1S2, RRR Abdomen:  nondistended, soft Extremities: no edema, no cyanosis or clubbing, no palmar lesions Neuro: awake and alert, moving all extremities spontaneously Derm: skin warm, dry, no rashes  Labs/imaging that I havepersonally reviewed  (right click and "Reselect all SmartList Selections" daily)  WBC  33> 17>18 (eos 6.8)  UA negative for RBCs/ Hb BNP 23  CT: multifocal airspace opacities centrally, R>L, but involving all lobes of the lungs.    Resolved Hospital Problem list     Assessment & Plan:  Multifocal pneumonia -  EGPA (doesn't meet ACR criteria at this time) vs hypereosinophilic syndrome vs parasitic infection vs CEP. Agree that this is likely reactive rather than primary or monoclonal eosinophilia. acute hypoxic respiratory failure improved- currently off oxygen Acute asthma exacerbation; concern for secondary cause of asthma -Bronchscopy tomorrow with BAL; unfortunately transbronchial biopsy would likely be low yield for COP, CEP, or vasculitis. -Serologies sent: ANCA w/ titers, MPO & PR-3, IgE level, IgG4 subclasses, EBV, HIV,ESR, CRP, strongyloides Ab,  -Parasite screen -holding steroids for now until after bronch -con't LABA-ICS daily -albuterol Q4h PRN -NPO past midnight for bronch tomorrow -Appreciate hematology's input.   Labs   CBC: Recent Labs  Lab 10/08/20 2211 10/09/20 0339 10/10/20 0419 10/11/20 0436  WBC 32.3* 32.6* 17.3* 18.3*  NEUTROABS 8.1*  --   --  7.5  HGB 14.7 14.8 12.4* 12.2*  HCT 41.9 44.0 36.0* 36.5*  MCV 85.9 88.4 87.8 90.3  PLT 584* 658* 540* 560*    Basic Metabolic Panel: Recent Labs  Lab 10/08/20 2211 10/09/20 0339 10/11/20 0436  NA 133* 134* 142  K 3.2* 4.1 3.8  CL 100 98 111  CO2 _0 GLUCOSE 103* 109* 103*  BUN _1 CREATININE 0.95 0.91 0.72  CALCIUM 7.9* 8.2* 8.2*  MG  --   --  1.9  PHOS  --   --  4.1   GFR: Estimated Creatinine Clearance: 114.1 mL/min (by C-G formula based on SCr of 0.72 mg/dL). Recent Labs  Lab  10/08/20 2211 10/09/20 0134 10/09/20 0307 10/09/20 0339 10/10/20 0419 10/11/20 0436  WBC 32.3*  --   --  32.6* 17.3* 18.3*  LATICACIDVEN  --  1.2 1.5  --   --   --     Liver Function Tests: Recent Labs  Lab 10/09/20 0107 10/11/20 0436  AST 12* 27  ALT 16 37  ALKPHOS 48 40  BILITOT 1.0 0.4  PROT 7.0 5.6*  ALBUMIN 2.5* 2.2*   No results for input(s): LIPASE, AMYLASE in the last 168 hours. No results for input(s): AMMONIA in the last 168 hours.  ABG No results found for: PHART, PCO2ART, PO2ART, HCO3, TCO2, ACIDBASEDEF, O2SAT   Coagulation Profile: Recent Labs  Lab 10/09/20 0107  INR 1.2    Cardiac Enzymes: No results for input(s): CKTOTAL, CKMB, CKMBINDEX, TROPONINI in the last 168 hours.  HbA1C: No results found for: HGBA1C  CBG: No results for input(s): GLUCAP in the last 168 hours.  Review of Systems:   Review of Systems  Constitutional: Positive for fever and weight loss. Negative for chills.       Poor appetite  HENT: Negative for congestion and sore  throat.   Eyes: Negative.   Respiratory: Positive for cough, shortness of breath and wheezing. Negative for sputum production.   Cardiovascular: Negative for chest pain and leg swelling.  Gastrointestinal: Negative for diarrhea, heartburn, nausea and vomiting.  Genitourinary: Negative for hematuria.  Musculoskeletal: Negative for joint pain and myalgias.  Neurological: Negative for tingling, speech change and focal weakness.  Endo/Heme/Allergies: Positive for environmental allergies.     Past Medical History:  He,  has a past medical history of Asthma.   Surgical History:   Past Surgical History:  Procedure Laterality Date  . sinus polyp surgery       Social History:   reports that he quit smoking about 3 years ago. He has never used smokeless tobacco. He reports current alcohol use. He reports that he does not use drugs.   Family History:  His family history includes Rheum arthritis in his  mother.   Allergies No Known Allergies   Home Medications  Prior to Admission medications   Medication Sig Start Date End Date Taking? Authorizing Provider  albuterol (VENTOLIN HFA) 108 (90 Base) MCG/ACT inhaler INHALE 2 PUFFS INTO THE LUNGS EVERY 6 (SIX) HOURS AS NEEDED FOR WHEEZING OR SHORTNESS OF BREATH. 09/14/20 09/14/21 Yes Ghimire, Henreitta Leber, MD  Azelastine-Fluticasone 137-50 MCG/ACT SUSP Place 1 spray into the nose in the morning and at bedtime. Patient taking differently: Place 1 spray into the nose 2 (two) times daily as needed (sinus issues). 02/28/20  Yes Garnet Sierras, DO  cetirizine (ZYRTEC) 10 MG tablet Take 10 mg by mouth daily.   Yes [provider]  fluticasone furoate-vilanterol (BREO ELLIPTA) 200-25 MCG/INH AEPB Inhale 1 puff into the lungs daily. 09/14/20 10/14/20 Yes Ghimire, Henreitta Leber, MD  sodium chloride (OCEAN) 0.65 % SOLN nasal spray Place 1 spray into both nostrils as needed for congestion. 03/13/20  Yes Couture, Cortni S, PA-C  predniSONE (DELTASONE) 10 MG tablet Take 40 mg daily for 1 day, 30 mg daily for 1 day, 20 mg daily for 1 days,10 mg daily for 1 day, then stop Patient not taking: Reported on 10/09/2020 09/14/20   Jonetta Osgood, MD       Julian Hy, DO 10/11/20 7:06 PM [facility]

## 2020-10-12 ENCOUNTER — Encounter (HOSPITAL_COMMUNITY)
Admission: EM | Disposition: A | Discharge status: Home / Self Care | Payer: Self-pay | Source: Home / Self Care | Attending: Family Medicine

## 2020-10-12 ENCOUNTER — Telehealth: Payer: Self-pay | Admitting: Critical Care Medicine

## 2020-10-12 ENCOUNTER — Inpatient Hospital Stay (HOSPITAL_COMMUNITY): Payer: Commercial Managed Care - PPO

## 2020-10-12 ENCOUNTER — Encounter (HOSPITAL_COMMUNITY): Payer: Self-pay | Admitting: Internal Medicine

## 2020-10-12 ENCOUNTER — Inpatient Hospital Stay (HOSPITAL_COMMUNITY): Payer: Commercial Managed Care - PPO | Admitting: Anesthesiology

## 2020-10-12 DIAGNOSIS — D7218 Eosinophilia in diseases classified elsewhere: Secondary | ICD-10-CM

## 2020-10-12 DIAGNOSIS — R0602 Shortness of breath: Secondary | ICD-10-CM | POA: Diagnosis not present

## 2020-10-12 DIAGNOSIS — R918 Other nonspecific abnormal finding of lung field: Secondary | ICD-10-CM

## 2020-10-12 HISTORY — PX: VIDEO BRONCHOSCOPY: SHX5072

## 2020-10-12 LAB — CBC WITH DIFFERENTIAL/PLATELET
Abs Immature Granulocytes: 0.14 10*3/uL — ABNORMAL HIGH (ref 0.00–0.07)
Basophils Absolute: 0.1 10*3/uL (ref 0.0–0.1)
Basophils Relative: 1 %
Eosinophils Absolute: 11.9 10*3/uL — ABNORMAL HIGH (ref 0.0–0.5)
Eosinophils Relative: 54 %
HCT: 38.5 % — ABNORMAL LOW (ref 39.0–52.0)
Hemoglobin: 13 g/dL (ref 13.0–17.0)
Immature Granulocytes: 1 %
Lymphocytes Relative: 19 %
Lymphs Abs: 4.2 10*3/uL — ABNORMAL HIGH (ref 0.7–4.0)
MCH: 30.2 pg (ref 26.0–34.0)
MCHC: 33.8 g/dL (ref 30.0–36.0)
MCV: 89.3 fL (ref 80.0–100.0)
Monocytes Absolute: 1.2 10*3/uL — ABNORMAL HIGH (ref 0.1–1.0)
Monocytes Relative: 6 %
Neutro Abs: 4.2 10*3/uL (ref 1.7–7.7)
Neutrophils Relative %: 19 %
Platelets: 583 10*3/uL — ABNORMAL HIGH (ref 150–400)
RBC: 4.31 MIL/uL (ref 4.22–5.81)
RDW: 13 % (ref 11.5–15.5)
WBC: 21.8 10*3/uL — ABNORMAL HIGH (ref 4.0–10.5)
nRBC: 0 % (ref 0.0–0.2)

## 2020-10-12 LAB — EPSTEIN-BARR VIRUS (EBV) ANTIBODY PROFILE
EBV NA IgG: 56.3 U/mL — ABNORMAL HIGH (ref 0.0–17.9)
EBV VCA IgG: <18 U/mL (ref 0.0–17.9)
EBV VCA IgM: <36 U/mL (ref 0.0–35.9)

## 2020-10-12 LAB — ECHOCARDIOGRAM COMPLETE
AR max vel: 3.31 cm2
AV Area VTI: 2.99 cm2
AV Area mean vel: 2.8 cm2
AV Mean grad: 4 mmHg
AV Peak grad: 7.1 mmHg
Ao pk vel: 1.33 m/s
Area-P 1/2: 3.48 cm2
Calc EF: 45.6 %
Height: 66 in
S' Lateral: 3 cm
Single Plane A2C EF: 45.5 %
Single Plane A4C EF: 46.9 %
Weight: 2557.34 oz

## 2020-10-12 LAB — COMPREHENSIVE METABOLIC PANEL
ALT: 42 U/L (ref 0–44)
AST: 20 U/L (ref 15–41)
Albumin: 2.3 g/dL — ABNORMAL LOW (ref 3.5–5.0)
Alkaline Phosphatase: 44 U/L (ref 38–126)
Anion gap: 6 (ref 5–15)
BUN: 15 mg/dL (ref 6–20)
CO2: 26 mmol/L (ref 22–32)
Calcium: 8.1 mg/dL — ABNORMAL LOW (ref 8.9–10.3)
Chloride: 108 mmol/L (ref 98–111)
Creatinine, Ser: 0.76 mg/dL (ref 0.61–1.24)
GFR, Estimated: >60 mL/min (ref >60)
Glucose, Bld: 91 mg/dL (ref 70–99)
Potassium: 4.1 mmol/L (ref 3.5–5.1)
Sodium: 140 mmol/L (ref 135–145)
Total Bilirubin: 0.5 mg/dL (ref 0.3–1.2)
Total Protein: 5.8 g/dL — ABNORMAL LOW (ref 6.5–8.1)

## 2020-10-12 LAB — BODY FLUID CELL COUNT WITH DIFFERENTIAL
Eos, Fluid: 85 %
Eos, Fluid: 89 %
Lymphs, Fluid: 0 %
Lymphs, Fluid: 2 %
Monocyte-Macrophage-Serous Fluid: 14 % — ABNORMAL LOW (ref 50–90)
Monocyte-Macrophage-Serous Fluid: 4 % — ABNORMAL LOW (ref 50–90)
Neutrophil Count, Fluid: 0 % (ref 0–25)
Neutrophil Count, Fluid: 0 % (ref 0–25)
Other Cells, Fluid: 1 %
Other Cells, Fluid: 5 %
Total Nucleated Cell Count, Fluid: 440 cu mm (ref 0–1000)
Total Nucleated Cell Count, Fluid: 75 cu mm (ref 0–1000)

## 2020-10-12 LAB — PHOSPHORUS: Phosphorus: 4.1 mg/dL (ref 2.5–4.6)

## 2020-10-12 LAB — MAGNESIUM: Magnesium: 2.1 mg/dL (ref 1.7–2.4)

## 2020-10-12 LAB — ANCA TITERS
Atypical P-ANCA titer: <1:20 {titer}
C-ANCA: <1:20 {titer}
P-ANCA: <1:20 {titer}

## 2020-10-12 LAB — STRONGYLOIDES, AB, IGG: Strongyloides, Ab, IgG: NEGATIVE

## 2020-10-12 SURGERY — VIDEO BRONCHOSCOPY WITHOUT FLUORO
Anesthesia: General

## 2020-10-12 MED ORDER — ALBUTEROL SULFATE (2.5 MG/3ML) 0.083% IN NEBU
2.5000 mg | INHALATION_SOLUTION | Freq: Once | RESPIRATORY_TRACT | Status: AC
  Administered 2020-10-12: 2.5 mg via RESPIRATORY_TRACT

## 2020-10-12 MED ORDER — SUGAMMADEX SODIUM 200 MG/2ML IV SOLN
INTRAVENOUS | Status: DC | PRN
  Administered 2020-10-12: 400 mg via INTRAVENOUS

## 2020-10-12 MED ORDER — MIDAZOLAM HCL (PF) 5 MG/ML IJ SOLN
INTRAMUSCULAR | Status: AC
  Filled 2020-10-12: qty 1

## 2020-10-12 MED ORDER — PHENYLEPHRINE HCL 0.25 % NA SOLN: 1.0000 | Freq: Four times a day (QID) | NASAL | Status: DC | PRN

## 2020-10-12 MED ORDER — LACTATED RINGERS IV SOLN: INTRAVENOUS | Status: DC | PRN

## 2020-10-12 MED ORDER — BUTAMBEN-TETRACAINE-BENZOCAINE 2-2-14 % EX AERO
1.0000 | INHALATION_SPRAY | Freq: Once | CUTANEOUS | Status: DC
  Administered 2020-10-12: 1 via TOPICAL

## 2020-10-12 MED ORDER — DEXAMETHASONE SODIUM PHOSPHATE 10 MG/ML IJ SOLN
INTRAMUSCULAR | Status: DC | PRN
  Administered 2020-10-12: 4 mg via INTRAVENOUS

## 2020-10-12 MED ORDER — PROPOFOL 10 MG/ML IV BOLUS
INTRAVENOUS | Status: DC | PRN
  Administered 2020-10-12: 200 mg via INTRAVENOUS

## 2020-10-12 MED ORDER — MIDAZOLAM HCL 5 MG/5ML IJ SOLN
INTRAMUSCULAR | Status: DC | PRN
  Administered 2020-10-12: 2 mg via INTRAVENOUS

## 2020-10-12 MED ORDER — ROCURONIUM BROMIDE 10 MG/ML (PF) SYRINGE
PREFILLED_SYRINGE | INTRAVENOUS | Status: DC | PRN
  Administered 2020-10-12: 50 mg via INTRAVENOUS

## 2020-10-12 MED ORDER — LIDOCAINE HCL URETHRAL/MUCOSAL 2 % EX GEL
1.0000 "application " | Freq: Once | CUTANEOUS | Status: DC
  Administered 2020-10-12: 1 via TOPICAL

## 2020-10-12 MED ORDER — FENTANYL CITRATE (PF) 100 MCG/2ML IJ SOLN
INTRAMUSCULAR | Status: DC | PRN
  Administered 2020-10-12: 75 ug via INTRAVENOUS

## 2020-10-12 MED ORDER — PREDNISONE 20 MG PO TABS
40.0000 mg | ORAL_TABLET | Freq: Every day | ORAL | Status: DC
  Administered 2020-10-13: 40 mg via ORAL
  Filled 2020-10-12: qty 2

## 2020-10-12 MED ORDER — ALBUTEROL SULFATE (2.5 MG/3ML) 0.083% IN NEBU
INHALATION_SOLUTION | RESPIRATORY_TRACT | Status: AC
  Filled 2020-10-12: qty 3

## 2020-10-12 MED ORDER — LIDOCAINE 2% (20 MG/ML) 5 ML SYRINGE
INTRAMUSCULAR | Status: DC | PRN
  Administered 2020-10-12: 60 mg via INTRAVENOUS

## 2020-10-12 MED ORDER — FENTANYL CITRATE (PF) 100 MCG/2ML IJ SOLN
INTRAMUSCULAR | Status: AC
  Filled 2020-10-12: qty 2

## 2020-10-12 MED ORDER — METHYLPREDNISOLONE SODIUM SUCC 125 MG IJ SOLR
60.0000 mg | Freq: Once | INTRAMUSCULAR | Status: AC
  Administered 2020-10-12: 60 mg via INTRAVENOUS
  Filled 2020-10-12: qty 2

## 2020-10-12 MED ORDER — ONDANSETRON HCL 4 MG/2ML IJ SOLN
INTRAMUSCULAR | Status: DC | PRN
  Administered 2020-10-12: 4 mg via INTRAVENOUS

## 2020-10-12 NOTE — Anesthesia Preprocedure Evaluation (Signed)
Anesthesia Evaluation  Patient identified by MRN, date of birth, ID band Patient awake    Reviewed: Allergy & Precautions, H&P , NPO status , Patient's Chart, lab work & pertinent test results  Airway Mallampati: II   Neck ROM: full    Dental   Pulmonary asthma , pneumonia, unresolved, former smoker,    breath sounds clear to auscultation       Cardiovascular negative cardio ROS   Rhythm:regular Rate:Normal     Neuro/Psych    GI/Hepatic   Endo/Other    Renal/GU      Musculoskeletal   Abdominal   Peds  Hematology   Anesthesia Other Findings   Reproductive/Obstetrics                             Anesthesia Physical Anesthesia Plan  ASA: II  Anesthesia Plan: General   Post-op Pain Management:    Induction: Intravenous  PONV Risk Score and Plan: 2 and Ondansetron, Dexamethasone, Midazolam and Treatment may vary due to age or medical condition  Airway Management Planned: Oral ETT  Additional Equipment:   Intra-op Plan:   Post-operative Plan: Extubation in OR  Informed Consent: I have reviewed the patients History and Physical, chart, labs and discussed the procedure including the risks, benefits and alternatives for the proposed anesthesia with the patient or authorized representative who has indicated his/her understanding and acceptance.     Dental advisory given  Plan Discussed with: CRNA, Anesthesiologist and Surgeon  Anesthesia Plan Comments:         Anesthesia Quick Evaluation

## 2020-10-12 NOTE — Telephone Encounter (Signed)
Appointment scheduled for May 3rd at 11 am, nothing further needed.

## 2020-10-12 NOTE — Telephone Encounter (Signed)
Please schedule for follow up in the office in about 1 week.  Steffanie Dunn, DO 10/12/20 2:07 PM [facility]

## 2020-10-12 NOTE — Progress Notes (Signed)
*  PRELIMINARY RESULTS* Echocardiogram 2D Echocardiogram has been performed.  Neomia Dear 10/12/2020, 3:18 PM

## 2020-10-12 NOTE — Progress Notes (Signed)
PROGRESS NOTE    Tracy Pugh  XBD:[id number] DOB: 04/04/1981 DOA: 10/08/2020 PCP: Patient, No Pcp Per (Inactive)   Chief Complaint  Patient presents with  . Shortness of Breath   Brief Narrative:  40 y.o. male with history of exercise-induced asthma only had COVID-19 infection in January 2022 about 3 months ago was admitted in March last month for pneumonia and discharged on September 14, 2020.  He's been readmitted on 4/18 with recurrent vs nonresolving pneumonia.  Assessment & Plan:   Principal Problem:   Multifocal pneumonia Active Problems:   Mild persistent asthma without complication  Multifocal pneumonia with sepsis, acute respiratory failure - recurrent vs non resolved? - per discussion, not sure he ever was completely back to his baseline after recent discharge - leukocytosis improved from admission, but persistent due to steroids below - sputum culture has been obtained -> normal resp flora - Strep pneumo antigen is negative - legionella negative - Respiratory panel influenza and COVID is negative - Blood cultures negative to date x3 - pulm consult, planning for bronchoscopy, w/u in setting of eosinophilia as noted below  Peripheral Eosinophilia - appreciate pulm and heme/onc assistawnce - bronchoscopy with BAL today - ANCA, mpo/pr3, Immunoglobulins, EBV ab profile, strongyloides Ab IgG negative, parisite exam screen without plasmodium or other blood parasites on thin smears, parasite exam blood pending, ESR/CRP elevated, IgE pending, IgG 1,2,3,4 pending  Asthma exacerbation-history of mild persistent asthma without complication -Continue IV Solu-Medrol, Breo Ellipta, DuoNebs every 4 hours and albuterol nebs every 2 as needed  Dehydration - improving, follow   Prior sinus issues with polyps - s/p surgery in 12/21 - no longer using Azelastine-fluticicone nasal spray  DVT prophylaxis: lovenox Code Status: full  Family Communication: father at  bedside Disposition:   Status is: Inpatient  Remains inpatient appropriate because:Inpatient level of care appropriate due to severity of illness   Dispo: The patient is from: Home              Anticipated d/c is to: Home              Patient currently is not medically stable to d/c.   Difficult to place patient No   Consultants:   none  Procedures:   none  Antimicrobials:  Anti-infectives (From admission, onward)   Start     Dose/Rate Route Frequency Ordered Stop   10/09/20 1000  vancomycin (VANCOREADY) IVPB 750 mg/150 mL  Status:  Discontinued        750 mg 150 mL/hr over 60 Minutes Intravenous Every 8 hours 10/09/20 0350 10/09/20 1129   10/09/20 0800  [MAR Hold]  cefTRIAXone (ROCEPHIN) 2 g in sodium chloride 0.9 % 100 mL IVPB        (MAR Hold since Fri 10/12/2020 at 1210.Hold Reason: Transfer to [facility] Procedural area.)   2 g 200 mL/hr over 30 Minutes Intravenous Every 24 hours 10/09/20 0339 10/14/20 0759   10/09/20 0345  [MAR Hold]  azithromycin (ZITHROMAX) 500 mg in sodium chloride 0.9 % 250 mL IVPB        (MAR Hold since Fri 10/12/2020 at 1210.Hold Reason: Transfer to [facility] Procedural area.)   500 mg 250 mL/hr over 60 Minutes Intravenous Daily 10/09/20 0339 10/14/20 0559   10/09/20 0115  vancomycin (VANCOREADY) IVPB 1000 mg/200 mL  Status:  Discontinued        1,000 mg 200 mL/hr over 60 Minutes Intravenous  Once 10/09/20 0107 10/09/20 0110   10/09/20 0115  ceFEPIme (MAXIPIME) 2 g in  sodium chloride 0.9 % 100 mL IVPB        2 g 200 mL/hr over 30 Minutes Intravenous  Once 10/09/20 0107 10/09/20 0209   10/09/20 0115  vancomycin (VANCOREADY) IVPB 1500 mg/300 mL        1,500 mg 150 mL/hr over 120 Minutes Intravenous  Once 10/09/20 0110 10/09/20 0413     Subjective: Feeling better, not at his baseline  Objective: Vitals:   10/12/20 0506 10/12/20 0817 10/12/20 1101 10/12/20 1215  BP: 119/85  112/80 119/79  Pulse: 80  83 80  Resp: 16  17 (!) 24  Temp: 98.2 F (36.8 C)  98.3  F (36.8 C)   TempSrc: Oral     SpO2: 93% 95% 95% 93%  Weight:      Height:        Intake/Output Summary (Last 24 hours) at 10/12/2020 1350 Last data filed at 10/12/2020 0752 Gross per 24 hour  Intake 240 ml  Output 500 ml  Net -260 ml   Filed Weights   10/09/20 0923 10/10/20 0321 10/11/20 0512  Weight: 73.7 kg 73.1 kg 72.5 kg    Examination:  General: No acute distress. Cardiovascular: Heart sounds show Charley Lafrance regular rate, and rhythm Lungs: bilateral wheezing Abdomen: Soft, nontender, nondistended  Neurological: Alert and oriented 3. Moves all extremities 4 . Cranial nerves II through XII grossly intact. Skin: Warm and dry. No rashes or lesions. Extremities: No clubbing or cyanosis. No edema.    Data Reviewed: I have personally reviewed following labs and imaging studies  CBC: Recent Labs  Lab 10/08/20 2211 10/09/20 0339 10/10/20 0419 10/11/20 0436 10/12/20 0412  WBC 32.3* 32.6* 17.3* 18.3* 21.8*  NEUTROABS 8.1*  --   --  7.5 4.2  HGB 14.7 14.8 12.4* 12.2* 13.0  HCT 41.9 44.0 36.0* 36.5* 38.5*  MCV 85.9 88.4 87.8 90.3 89.3  PLT 584* 658* 540* 560* 583*    Basic Metabolic Panel: Recent Labs  Lab 10/08/20 2211 10/09/20 0339 10/11/20 0436 10/12/20 0412  NA 133* 134* 142 140  K 3.2* 4.1 3.8 4.1  CL 100 98 111 108  CO2 _0 GLUCOSE 103* 109* 103* 91  BUN _1 CREATININE 0.95 0.91 0.72 0.76  CALCIUM 7.9* 8.2* 8.2* 8.1*  MG  --   --  1.9 2.1  PHOS  --   --  4.1 4.1    GFR: Estimated Creatinine Clearance: 114.1 mL/min (by C-G formula based on SCr of 0.76 mg/dL).  Liver Function Tests: Recent Labs  Lab 10/09/20 0107 10/11/20 0436 10/12/20 0412  AST 12* 27 20  ALT 16 37 42  ALKPHOS 48 40 44  BILITOT 1.0 0.4 0.5  PROT 7.0 5.6* 5.8*  ALBUMIN 2.5* 2.2* 2.3*    CBG: No results for input(s): GLUCAP in the last 168 hours.   Recent Results (from the past 240 hour(s))  Resp Panel by RT-PCR (Flu Mirielle Byrum&B, Covid) Nasopharyngeal Swab      Status: None   Collection Time: 10/09/20  1:07 AM   Specimen: Nasopharyngeal Swab; Nasopharyngeal(NP) swabs in vial transport medium  Result Value Ref Range Status   SARS Coronavirus 2 by RT PCR NEGATIVE NEGATIVE Final    Comment: (NOTE) SARS-CoV-2 target nucleic acids are NOT DETECTED.  The SARS-CoV-2 RNA is generally detectable in upper respiratory specimens during the acute phase of infection. The lowest concentration of SARS-CoV-2 viral copies this assay can detect is 138 copies/mL. Kymorah Korf negative result does not preclude  SARS-Cov-2 infection and should not be used as the sole basis for treatment or other patient management decisions. Dartanion Teo negative result may occur with  improper specimen collection/handling, submission of specimen other than nasopharyngeal swab, presence of viral mutation(s) within the areas targeted by this assay, and inadequate number of viral copies(<138 copies/mL). Gordie Crumby negative result must be combined with clinical observations, patient history, and epidemiological information. The expected result is Negative.  Fact Sheet for Patients:  EntrepreneurPulse.com.au  Fact Sheet for Healthcare Providers:  IncredibleEmployment.be  This test is no t yet approved or cleared by the [place] FDA and  has been authorized for detection and/or diagnosis of SARS-CoV-2 by FDA under an Emergency Use Authorization (EUA). This EUA will remain  in effect (meaning this test can be used) for the duration of the COVID-19 declaration under Section 564(b)(1) of the Act, 21 U.S.C.section 360bbb-3(b)(1), unless the authorization is terminated  or revoked sooner.       Influenza Dauntae Derusha by PCR NEGATIVE NEGATIVE Final   Influenza B by PCR NEGATIVE NEGATIVE Final    Comment: (NOTE) The Xpert Xpress SARS-CoV-2/FLU/RSV plus assay is intended as an aid in the diagnosis of influenza from Nasopharyngeal swab specimens and should not be used as Salwa Bai sole basis  for treatment. Nasal washings and aspirates are unacceptable for Xpert Xpress SARS-CoV-2/FLU/RSV testing.  Fact Sheet for Patients: EntrepreneurPulse.com.au  Fact Sheet for Healthcare Providers: IncredibleEmployment.be  This test is not yet approved or cleared by the [place] FDA and has been authorized for detection and/or diagnosis of SARS-CoV-2 by FDA under an Emergency Use Authorization (EUA). This EUA will remain in effect (meaning this test can be used) for the duration of the COVID-19 declaration under Section 564(b)(1) of the Act, 21 U.S.C. section 360bbb-3(b)(1), unless the authorization is terminated or revoked.  Performed at [facility], [place] [address] [place], [place] [address]   Blood Culture (routine x 2)     Status: None (Preliminary result)   Collection Time: 10/09/20  1:07 AM   Specimen: BLOOD  Result Value Ref Range Status   Specimen Description BLOOD RIGHT ANTECUBITAL  Final   Special Requests   Final    BOTTLES DRAWN AEROBIC AND ANAEROBIC Blood Culture adequate volume   Culture   Final    NO GROWTH 3 DAYS Performed at [facility], [place] [address], [place], [place] [address]    Report Status PENDING  Incomplete  Blood Culture (routine x 2)     Status: None (Preliminary result)   Collection Time: 10/09/20  2:19 AM   Specimen: BLOOD  Result Value Ref Range Status   Specimen Description BLOOD SITE NOT SPECIFIED  Final   Special Requests   Final    BOTTLES DRAWN AEROBIC AND ANAEROBIC Blood Culture adequate volume   Culture   Final    NO GROWTH 3 DAYS Performed at [facility], [address], [place], [place] [address]    Report Status PENDING  Incomplete  Urine culture     Status: None   Collection Time: 10/09/20  3:14 AM   Specimen: In/Out Cath Urine  Result Value Ref Range Status   Specimen Description IN/OUT CATH URINE  Final   Special Requests NONE  Final   Culture   Final    NO  GROWTH Performed at [facility], [place] [address], [place], [place] [address]    Report Status 10/10/2020 FINAL  Final  Respiratory (~20 pathogens) panel by PCR  Status: None   Collection Time: 10/09/20  3:41 AM   Specimen: Nasopharyngeal Swab; Respiratory  Result Value Ref Range Status   Adenovirus NOT DETECTED NOT DETECTED Final   Coronavirus 229E NOT DETECTED NOT DETECTED Final    Comment: (NOTE) The Coronavirus on the Respiratory Panel, DOES NOT test for the novel  Coronavirus (2019 nCoV)    Coronavirus HKU1 NOT DETECTED NOT DETECTED Final   Coronavirus NL63 NOT DETECTED NOT DETECTED Final   Coronavirus OC43 NOT DETECTED NOT DETECTED Final   Metapneumovirus NOT DETECTED NOT DETECTED Final   Rhinovirus / Enterovirus NOT DETECTED NOT DETECTED Final   Influenza Ilias Stcharles NOT DETECTED NOT DETECTED Final   Influenza B NOT DETECTED NOT DETECTED Final   Parainfluenza Virus 1 NOT DETECTED NOT DETECTED Final   Parainfluenza Virus 2 NOT DETECTED NOT DETECTED Final   Parainfluenza Virus 3 NOT DETECTED NOT DETECTED Final   Parainfluenza Virus 4 NOT DETECTED NOT DETECTED Final   Respiratory Syncytial Virus NOT DETECTED NOT DETECTED Final   Bordetella pertussis NOT DETECTED NOT DETECTED Final   Bordetella Parapertussis NOT DETECTED NOT DETECTED Final   Chlamydophila pneumoniae NOT DETECTED NOT DETECTED Final   Mycoplasma pneumoniae NOT DETECTED NOT DETECTED Final    Comment: Performed at [facility], [address], [place], [place] [address]  Culture, sputum-assessment     Status: None   Collection Time: 10/09/20  5:19 AM   Specimen: Sputum  Result Value Ref Range Status   Specimen Description SPUTUM  Final   Special Requests NONE  Final   Sputum evaluation   Final    THIS SPECIMEN IS ACCEPTABLE FOR SPUTUM CULTURE Performed at [facility], [address], [place], [place] [address]    Report Status 10/09/2020 FINAL  Final  Culture, Respiratory w Gram Stain     Status:  None   Collection Time: 10/09/20  5:19 AM   Specimen: SPU  Result Value Ref Range Status   Specimen Description SPUTUM  Final   Special Requests NONE Reflexed from [id number]  Final   Gram Stain   Final    RARE WBC PRESENT, PREDOMINANTLY PMN RARE GRAM POSITIVE COCCI RARE GRAM NEGATIVE RODS    Culture   Final    MODERATE Normal respiratory flora-no Staph aureus or Pseudomonas seen Performed at [facility], [place] [address], [place], [place] [address]    Report Status 10/11/2020 FINAL  Final  MRSA PCR Screening     Status: None   Collection Time: 10/09/20  7:47 AM   Specimen: Nasal Mucosa; Nasopharyngeal  Result Value Ref Range Status   MRSA by PCR NEGATIVE NEGATIVE Final    Comment:        The GeneXpert MRSA Assay (FDA approved for NASAL specimens only), is one component of Neera Teng comprehensive MRSA colonization surveillance program. It is not intended to diagnose MRSA infection nor to guide or monitor treatment for MRSA infections. Performed at [facility], [place] [address] [place], [place] [address]          Radiology Studies: No results found.      Scheduled Meds: . [MAR Hold] fluticasone furoate-vilanterol  1 puff Inhalation Daily  . [MAR Hold] ipratropium-albuterol  3 mL Nebulization Q6H   Continuous Infusions: . [MAR Hold] azithromycin 500 mg (10/12/20 0512)  . [MAR Hold] cefTRIAXone (ROCEPHIN)  IV 2 g (10/12/20 0827)     LOS: 3 days    Time spent: over 30 min  Fayrene Helper, MD [facility]   To contact the attending provider between 7A-7P or the covering provider during after hours 7P-7A, please log into the web site [email or web address] and access using universal [place] password for that web site. If you do not have the password, please call the hospital operator.  10/12/2020, 1:50 PM

## 2020-10-12 NOTE — Transfer of Care (Signed)
Immediate Anesthesia Transfer of Care Note  Patient: Tracy Pugh  Procedure(s) Performed: VIDEO BRONCHOSCOPY WITHOUT FLUORO WITH BRONCHOALVEOLAR LAVAGE (N/A )  Patient Location: Endoscopy Unit  Anesthesia Type:General  Level of Consciousness: awake, alert  and oriented  Airway & Oxygen Therapy: Patient Spontanous Breathing and Patient connected to face mask oxygen  Post-op Assessment: Report given to RN and Post -op Vital signs reviewed and stable  Post vital signs: Reviewed and stable  Last Vitals:  Vitals Value Taken Time  BP 92/61 10/12/20 1407  Temp    Pulse 109 10/12/20 1408  Resp 25 10/12/20 1408  SpO2 95 % 10/12/20 1408  Vitals shown include unvalidated device data.  Last Pain:  Vitals:   10/12/20 1215  TempSrc:   PainSc: 0-No pain      Patients Stated Pain Goal: 0 (93/23/55 [id number])  Complications: No complications documented.

## 2020-10-12 NOTE — Interval H&P Note (Signed)
History and Physical Interval Note:  10/12/2020 12:32 PM  Marlon Suleiman  has presented today for surgery, with the diagnosis of SHORTNESS OF BREATH, MULTIFOCAL PNEUMONIA, ACUTE RESPIRATORY FAILURE.  The various methods of treatment have been discussed with the patient and family. After consideration of risks, benefits and other options for treatment, the patient has consented to  Procedure(s): VIDEO BRONCHOSCOPY WITHOUT FLUORO WITH BRONCHOALVEOLAR LAVAGE (N/A) as a surgical intervention.  The patient's history has been reviewed, patient examined, no change in status, stable for surgery.  I have reviewed the patient's chart and labs.  Questions were answered to the patient's satisfaction.    No changes since yesterday. NPO since midnight, no blood thinners.  Mallampati 3, ASA2   Procedure discussed in detail including potential risks, benefits, and alternatives. Consent signed and placed in the chart. All questions answered.  Julian Hy, DO 10/12/20 12:33 PM [facility]

## 2020-10-12 NOTE — Procedures (Signed)
Video Bronchoscopy Procedure Note  Date of Operation: 10/12/2020  Pre-op Diagnosis: abnormal CT scan, eosinophilia  Post-op Diagnosis: Same  Surgeon: Julian Hy  Assistants: none  Anesthesia: deep sedation  Meds Given: per anesthesia records  Operation: Flexible video fiberoptic bronchoscopy and BAL.  Estimated Blood Loss: 0cc  Complications: none noted  Indications and History: Tracy Pugh is 40 y.o. with history of asthma, peripheral eosinophilia.  Recommendation was to perform video fiberoptic bronchoscopy with biopsies. The risks, benefits, complications, treatment options and expected outcomes were discussed with the patient.  The possibilities of pneumothorax, pneumonia, reaction to medication, pulmonary aspiration, perforation of a viscus, bleeding, failure to diagnose a condition and creating a complication requiring transfusion or operation were discussed with the patient who freely signed the consent.    Description of Procedure: The patient was seen in the Preoperative Area, was examined and was deemed appropriate to proceed.  The patient was taken to endoscopy, identified as Tracy Pugh and the procedure verified as Flexible Video Fiberoptic Bronchoscopy.  A Time Out was held and the above information confirmed.   Conscious sedation was initiated as indicated above. The video fiberoptic bronchoscope was introduced via the ETT and a general inspection was performed which showed normal distal trachea, normal main carina. The R sided airways were inspected and showed normal RUL, BI, RML and RLL. The L side was then inspected. The LLL, Lingular and LUL airways were normal. Some clear, easily removed secretions were suctioned out of segmental bronchi.  BAL performed of anterior segment of RUL x 2 with 50cc instilled and 30cc returned both times. BAL performed of anterior RLL with 50cc instilled and 15cc returned. The patient tolerated the procedure well. The  bronchoscope was removed. There were no obvious complications. Anesthesia continued care and extubated the patient unsuccessfully.  Samples: 1. Bronchial washings from anterior RUL & anterior RLL- cell count with diff in both lobes, cytology in RUL, micro studies (routine resp, fungal, AFB).  Plans:  We will review the cytology amicrobiology results with the patient when they become available.  Outpatient followup has been requested.  Julian Hy, DO 10/12/20 2:09 PM [facility]

## 2020-10-12 NOTE — Anesthesia Procedure Notes (Signed)
Procedure Name: Intubation Date/Time: 10/12/2020 1:41 PM Performed by: Inda Coke, CRNA Pre-anesthesia Checklist: Patient identified, Emergency Drugs available, Suction available and Patient being monitored Patient Re-evaluated:Patient Re-evaluated prior to induction Oxygen Delivery Method: Circle System Utilized Preoxygenation: Pre-oxygenation with 100% oxygen Induction Type: IV induction Ventilation: Mask ventilation without difficulty Laryngoscope Size: Mac and 4 Grade View: Grade I Tube type: Oral Tube size: 8.5 mm Number of attempts: 1 Airway Equipment and Method: Stylet and Oral airway Placement Confirmation: ETT inserted through vocal cords under direct vision,  positive ETCO2 and breath sounds checked- equal and bilateral Secured at: 22 cm Tube secured with: Tape Dental Injury: Teeth and Oropharynx as per pre-operative assessment

## 2020-10-13 LAB — CBC WITH DIFFERENTIAL/PLATELET
Abs Immature Granulocytes: 0.44 10*3/uL — ABNORMAL HIGH (ref 0.00–0.07)
Basophils Absolute: 0.1 10*3/uL (ref 0.0–0.1)
Basophils Relative: 0 %
Eosinophils Absolute: 1.9 10*3/uL — ABNORMAL HIGH (ref 0.0–0.5)
Eosinophils Relative: 14 %
HCT: 38.6 % — ABNORMAL LOW (ref 39.0–52.0)
Hemoglobin: 13 g/dL (ref 13.0–17.0)
Immature Granulocytes: 3 %
Lymphocytes Relative: 16 %
Lymphs Abs: 2.1 10*3/uL (ref 0.7–4.0)
MCH: 29.6 pg (ref 26.0–34.0)
MCHC: 33.7 g/dL (ref 30.0–36.0)
MCV: 87.9 fL (ref 80.0–100.0)
Monocytes Absolute: 0.9 10*3/uL (ref 0.1–1.0)
Monocytes Relative: 7 %
Neutro Abs: 8.1 10*3/uL — ABNORMAL HIGH (ref 1.7–7.7)
Neutrophils Relative %: 60 %
Platelets: 645 10*3/uL — ABNORMAL HIGH (ref 150–400)
RBC: 4.39 MIL/uL (ref 4.22–5.81)
RDW: 12.9 % (ref 11.5–15.5)
WBC: 13.5 10*3/uL — ABNORMAL HIGH (ref 4.0–10.5)
nRBC: 0 % (ref 0.0–0.2)

## 2020-10-13 LAB — ACID FAST SMEAR (AFB, MYCOBACTERIA): Acid Fast Smear: NEGATIVE

## 2020-10-13 LAB — PHOSPHORUS: Phosphorus: 4.5 mg/dL (ref 2.5–4.6)

## 2020-10-13 LAB — COMPREHENSIVE METABOLIC PANEL
ALT: 52 U/L — ABNORMAL HIGH (ref 0–44)
AST: 22 U/L (ref 15–41)
Albumin: 2.6 g/dL — ABNORMAL LOW (ref 3.5–5.0)
Alkaline Phosphatase: 54 U/L (ref 38–126)
Anion gap: 8 (ref 5–15)
BUN: 19 mg/dL (ref 6–20)
CO2: 22 mmol/L (ref 22–32)
Calcium: 8.3 mg/dL — ABNORMAL LOW (ref 8.9–10.3)
Chloride: 106 mmol/L (ref 98–111)
Creatinine, Ser: 0.97 mg/dL (ref 0.61–1.24)
GFR, Estimated: >60 mL/min (ref >60)
Glucose, Bld: 125 mg/dL — ABNORMAL HIGH (ref 70–99)
Potassium: 4.1 mmol/L (ref 3.5–5.1)
Sodium: 136 mmol/L (ref 135–145)
Total Bilirubin: 0.5 mg/dL (ref 0.3–1.2)
Total Protein: 6.2 g/dL — ABNORMAL LOW (ref 6.5–8.1)

## 2020-10-13 LAB — IGG 1, 2, 3, AND 4
IgG (Immunoglobin G), Serum: 1192 mg/dL (ref 603–1613)
IgG, Subclass 1: 458 mg/dL (ref 248–810)
IgG, Subclass 2: 171 mg/dL (ref 130–555)
IgG, Subclass 3: 6 mg/dL — ABNORMAL LOW (ref 15–102)
IgG, Subclass 4: 457 mg/dL — ABNORMAL HIGH (ref 2–96)

## 2020-10-13 LAB — MPO/PR-3 (ANCA) ANTIBODIES
ANCA Proteinase 3: <3.5 U/mL (ref 0.0–3.5)
Myeloperoxidase Abs: <9 U/mL (ref 0.0–9.0)

## 2020-10-13 LAB — MAGNESIUM: Magnesium: 2.3 mg/dL (ref 1.7–2.4)

## 2020-10-13 MED ORDER — PREDNISONE 20 MG PO TABS: 40.0000 mg | ORAL_TABLET | Freq: Every day | ORAL | 0 refills | Status: DC

## 2020-10-13 MED ORDER — IPRATROPIUM-ALBUTEROL 0.5-2.5 (3) MG/3ML IN SOLN: 3.0000 mL | Freq: Two times a day (BID) | RESPIRATORY_TRACT | Status: DC

## 2020-10-13 NOTE — Plan of Care (Signed)

## 2020-10-13 NOTE — Discharge Summary (Addendum)
Physician Discharge Summary  Tracy Pugh MRN:[id number] DOB: 01/22/1981 DOA: 10/08/2020  PCP: Patient, No Pcp Per (Inactive)  Admit date: 10/08/2020 Discharge date: 10/13/2020  Time spent: 40 minutes  Recommendations for Outpatient Follow-up:  1. Follow outpatient CBC/CMP 2. Follow with pulm as scheduled outpatient 3. Follow pending studies outpatient with pulm 4. Continue steroids until pulm follow up    Discharge Diagnoses:  Principal Problem:   Multifocal pneumonia Active Problems:   Mild persistent asthma without complication   Discharge Condition: stable  Diet recommendation: heart healthy  Filed Weights   10/10/20 0321 10/11/20 0512 10/13/20 0344  Weight: 73.1 kg 72.5 kg 71.9 kg    History of present illness:  39 y.o.malewithhistory of exercise-induced asthma only had COVID-19 infection in January 2022 about 3 months ago was admitted in March last month for pneumonia and discharged on September 14, 2020.  He's been readmitted on 4/18 with recurrent vs nonresolving pneumonia.  Pulmonology and heme were consulted with eosinophilia.  He's now s/p bronchoscopy.  Plan is for follow up outpatient with pulmonology.  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   Hospital Course:  Multifocal pneumoniawith sepsis,acute respiratory failure -recurrent vs non resolved? 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 below -sputum culture has been obtained -> normal resp flora -Strep pneumo antigen is negative - legionella negative -Respiratory panel influenza and COVID is negative -Blood cultures negative to date x3 - s/p bronchoscopy - pending acid fast smear, acid fast culture, fungal culture, resp culture, cytology, cell count with 85% eos, IgG 1,2,3,4 pending, IgE pending, ESR/CRP elevated, parasite exam pending, parasite screen without plasmodium or other blood parasites,  EBV with positive EBV NA IgG, negative strongyloides,  negative C-ANCA, negative P-ANCA, negative atypical P-ANCA titer, pending immunoglobulins, pending MPO/PR3 - pulm consult, planning for bronchoscopy, w/u in setting of eosinophilia as noted below - d/c with steroids and follow up with pulm outpatient.    Peripheral Eosinophilia - appreciate pulm and heme/onc assistawnce - s/p bronchoscopy with BAL as above - ANCA, mpo/pr3, Immunoglobulins, EBV ab profile with positive EBV NA IgG, strongyloides Ab IgG negative, parisite exam screen without plasmodium or other blood parasites on thin smears, parasite exam blood pending, ESR/CRP elevated, IgE pending, IgG 1,2,3,4 pending  Asthma exacerbation-history of mild persistent asthma without complication -continue home meds  Dehydration -improving, follow   Prior sinus issues with polyps - s/p surgery in 12/21 - no longer using Azelastine-fluticicone nasal spray  Procedures:  Flexible video fiberoptic Bronchoscopy and BAL  Consultations:  pulmonology  Discharge Exam: Vitals:   10/13/20 0817 10/13/20 1219  BP:  111/63  Pulse:  80  Resp:  20  Temp:  98.6 F (37 C)  SpO2: 95% 96%   Feels better today, eager to d/c  General: No acute distress. Cardiovascular: Heart sounds show a regular rate, and rhythm. Lungs: occasional wheezes, improved Abdomen: Soft, nontender, nondistended  Neurological: Alert and oriented 3. Moves all extremities 4  Cranial nerves II through XII grossly intact. Skin: Warm and dry. No rashes or lesions. Extremities: No clubbing or cyanosis. No edema.   Discharge Instructions   Discharge Instructions    Call MD for:  difficulty breathing, headache or visual disturbances   Complete by: As directed    Call MD for:  extreme fatigue   Complete by: As directed    Call MD for:  hives   Complete by: As directed    Call MD for:  persistant dizziness or light-headedness     Complete by: As directed    Call MD for:  persistant nausea and vomiting   Complete  by: As directed    Call MD for:  redness, tenderness, or signs of infection (pain, swelling, redness, odor or green/yellow discharge around incision site)   Complete by: As directed    Call MD for:  severe uncontrolled pain   Complete by: As directed    Call MD for:  temperature >100.4   Complete by: As directed    Diet - low sodium heart healthy   Complete by: As directed    Discharge instructions   Complete by: As directed    You were seen for pneumonia.  Your work up is still pending at this time.  Please follow up with pulmonology as an outpatient to review your pending tests and discuss the diagnosis.  We'll send you with steroids to take until your follow up appointment.  Do not stop steroids until discussing plan with pulmonology (if for some reason you're going to run out of pills before your appointment, call your PCP or pulmonology to assist with a prescription).  Return for new, recurrent, or worsening symptoms.  Please ask your PCP to request records from this hospitalization so they know what was done and what the next steps will be.   Increase activity slowly   Complete by: As directed      Allergies as of 10/13/2020   No Known Allergies     Medication List    TAKE these medications   albuterol 108 (90 Base) MCG/ACT inhaler Commonly known as: VENTOLIN HFA INHALE 2 PUFFS INTO THE LUNGS EVERY 6 (SIX) HOURS AS NEEDED FOR WHEEZING OR SHORTNESS OF BREATH.   Azelastine-Fluticasone 137-50 MCG/ACT Susp Place 1 spray into the nose in the morning and at bedtime. What changed:   when to take this  reasons to take this   cetirizine 10 MG tablet Commonly known as: ZYRTEC Take 10 mg by mouth daily.   fluticasone furoate-vilanterol 200-25 MCG/INH Aepb Commonly known as: BREO ELLIPTA Inhale 1 puff into the lungs daily.   predniSONE 20 MG tablet Commonly known as: DELTASONE Take 2 tablets (40 mg total) by mouth daily with breakfast for 10 days. (please follow up with  pulmonology regarding taper) Start taking on: October 14, 2020 What changed:   medication strength  how much to take  how to take this  when to take this  additional instructions   sodium chloride 0.65 % Soln nasal spray Commonly known as: OCEAN Place 1 spray into both nostrils as needed for congestion.      No Known Allergies    The results of significant diagnostics from this hospitalization (including imaging, microbiology, ancillary and laboratory) are listed below for reference.    Significant Diagnostic Studies: DG Chest 2 View  Result Date: 10/08/2020 CLINICAL DATA:  Cough, shortness of breath EXAM: CHEST - 2 VIEW COMPARISON:  09/11/2020 FINDINGS: Consolidation seen throughout the right lung. Patchy left perihilar airspace opacities. Findings compatible with pneumonia. Heart is normal size. No effusions or acute bony abnormality. IMPRESSION: Patchy airspace disease throughout the right lung and in the left perihilar region compatible with multifocal pneumonia. Electronically Signed   By: Kevin  Dover M.D.   On: 10/08/2020 22:32   CT Angio Chest PE W and/or Wo Contrast  Result Date: 10/09/2020 CLINICAL DATA:  Chest pain, shortness of breath EXAM: CT ANGIOGRAPHY CHEST WITH CONTRAST TECHNIQUE: Multidetector CT imaging of the chest was performed using the standard protocol   during bolus administration of intravenous contrast. Multiplanar CT image reconstructions and MIPs were obtained to evaluate the vascular anatomy. CONTRAST:  80mL OMNIPAQUE IOHEXOL 350 MG/ML SOLN COMPARISON:  Chest x-ray 10/08/2020 FINDINGS: Cardiovascular: Suboptimal opacification of the pulmonary arteries. No large central pulmonary emboli. Insert Heart. Mediastinum/Nodes: Prominent mediastinal and bilateral hilar lymph nodes, likely reactive. No axillary adenopathy. Lungs/Pleura: Extensive airspace disease throughout the right upper lobe and right lower lobe. Patchy perihilar opacities in the left lung,  predominantly lingula. No effusions. Upper Abdomen: Imaging into the upper abdomen demonstrates no acute findings. Musculoskeletal: Chest wall soft tissues are unremarkable. No acute bony abnormality. Review of the MIP images confirms the above findings. IMPRESSION: Bilateral airspace disease, right greater than left most compatible with multifocal pneumonia. Suboptimal opacification of pulmonary arteries. No large central pulmonary emboli. Electronically Signed   By: Kevin  Dover M.D.   On: 10/09/2020 02:51   ECHOCARDIOGRAM COMPLETE  Result Date: 10/12/2020    ECHOCARDIOGRAM REPORT   Patient Name:   Tracy Pugh Date of Exam: 10/12/2020 Medical Rec #:  [id number]       Height:       66.0 in Accession #:    [id number]      Weight:       159.8 lb Date of Birth:  01/25/1981       BSA:          1.818 m Patient Age:    39 years        BP:           119/85 mmHg Patient Gender: M               HR:           86 bpm. Exam Location:  Inpatient Procedure: 2D Echo, Cardiac Doppler and Color Doppler Indications:    R06.02 SOB  History:        Patient has no prior history of Echocardiogram examinations.  Sonographer:    TAMARA CROWN RDCS Referring Phys: [id number] JONATHAN B DEWALD IMPRESSIONS  1. Left ventricular ejection fraction, by estimation, is 50 to 55%. The left ventricle has low normal function. The left ventricle has no regional wall motion abnormalities. Left ventricular diastolic parameters were normal.  2. Right ventricular systolic function is normal. The right ventricular size is normal.  3. The mitral valve is normal in structure. Trivial mitral valve regurgitation. No evidence of mitral stenosis.  4. The aortic valve is normal in structure. Aortic valve regurgitation is not visualized. No aortic stenosis is present.  5. The inferior vena cava is normal in size with greater than 50% respiratory variability, suggesting right atrial pressure of 3 mmHg. FINDINGS  Left Ventricle: Left ventricular ejection fraction,  by estimation, is 50 to 55%. The left ventricle has low normal function. The left ventricle has no regional wall motion abnormalities. The left ventricular internal cavity size was normal in size. There is no left ventricular hypertrophy. Left ventricular diastolic parameters were normal. Right Ventricle: The right ventricular size is normal. No increase in right ventricular wall thickness. Right ventricular systolic function is normal. Left Atrium: Left atrial size was normal in size. Right Atrium: Right atrial size was normal in size. Pericardium: There is no evidence of pericardial effusion. Mitral Valve: The mitral valve is normal in structure. Trivial mitral valve regurgitation. No evidence of mitral valve stenosis. Tricuspid Valve: The tricuspid valve is normal in structure. Tricuspid valve regurgitation is not demonstrated. No evidence of tricuspid stenosis. Aortic Valve: The aortic   valve is normal in structure. Aortic valve regurgitation is not visualized. No aortic stenosis is present. Aortic valve mean gradient measures 4.0 mmHg. Aortic valve peak gradient measures 7.1 mmHg. Aortic valve area, by VTI measures 2.99 cm. Pulmonic Valve: The pulmonic valve was normal in structure. Pulmonic valve regurgitation is trivial. No evidence of pulmonic stenosis. Aorta: The aortic root is normal in size and structure. Venous: The inferior vena cava is normal in size with greater than 50% respiratory variability, suggesting right atrial pressure of 3 mmHg. IAS/Shunts: No atrial level shunt detected by color flow Doppler.  LEFT VENTRICLE PLAX 2D LVIDd:         5.00 cm     Diastology LVIDs:         3.00 cm     LV e' medial:    9.36 cm/s LV PW:         0.80 cm     LV E/e' medial:  7.7 LV IVS:        1.20 cm     LV e' lateral:   11.40 cm/s LVOT diam:     2.10 cm     LV E/e' lateral: 6.4 LV SV:         80 LV SV Index:   44 LVOT Area:     3.46 cm  LV Volumes (MOD) LV vol d, MOD A2C: 79.6 ml LV vol d, MOD A4C: 84.4 ml LV vol  s, MOD A2C: 43.4 ml LV vol s, MOD A4C: 44.8 ml LV SV MOD A2C:     36.2 ml LV SV MOD A4C:     84.4 ml LV SV MOD BP:      37.7 ml RIGHT VENTRICLE RV S prime:     12.90 cm/s TAPSE (M-mode): 2.3 cm LEFT ATRIUM             Index LA diam:        3.20 cm 1.76 cm/m LA Vol (A2C):   36.7 ml 20.19 ml/m LA Vol (A4C):   21.8 ml 11.99 ml/m LA Biplane Vol: 29.3 ml 16.12 ml/m  AORTIC VALVE                   PULMONIC VALVE AV Area (Vmax):    3.31 cm    PV Vmax:       0.94 m/s AV Area (Vmean):   2.80 cm    PV Vmean:      84.700 cm/s AV Area (VTI):     2.99 cm    PV VTI:        0.208 m AV Vmax:           133.00 cm/s PV Peak grad:  3.5 mmHg AV Vmean:          96.900 cm/s PV Mean grad:  3.0 mmHg AV VTI:            0.266 m AV Peak Grad:      7.1 mmHg AV Mean Grad:      4.0 mmHg LVOT Vmax:         127.00 cm/s LVOT Vmean:        78.200 cm/s LVOT VTI:          0.230 m LVOT/AV VTI ratio: 0.86  AORTA Ao Root diam: 3.30 cm MITRAL VALVE MV Area (PHT): 3.48 cm    SHUNTS MV Decel Time: 218 msec    Systemic VTI:  0.23 m MV E velocity: 72.40 cm/s  Systemic Diam: 2.10 cm   MV [place] velocity: 62.10 cm/s MV E/Hallie Ertl ratio:  1.17 Glori Bickers MD Electronically signed by Glori Bickers MD Signature Date/Time: 10/12/2020/3:53:56 PM    Final     Microbiology: Recent Results (from the past 240 hour(s))  Resp Panel by RT-PCR (Flu Gamble Enderle&B, Covid) Nasopharyngeal Swab     Status: None   Collection Time: 10/09/20  1:07 AM   Specimen: Nasopharyngeal Swab; Nasopharyngeal(NP) swabs in vial transport medium  Result Value Ref Range Status   SARS Coronavirus 2 by RT PCR NEGATIVE NEGATIVE Final    Comment: (NOTE) SARS-CoV-2 target nucleic acids are NOT DETECTED.  The SARS-CoV-2 RNA is generally detectable in upper respiratory specimens during the acute phase of infection. The lowest concentration of SARS-CoV-2 viral copies this assay can detect is 138 copies/mL. [place] Knee negative result does not preclude SARS-Cov-2 infection and should not be used as the sole  basis for treatment or other patient management decisions. [place] Defina negative result may occur with  improper specimen collection/handling, submission of specimen other than nasopharyngeal swab, presence of viral mutation(s) within the areas targeted by this assay, and inadequate number of viral copies(<138 copies/mL). Jermany Rimel negative result must be combined with clinical observations, patient history, and epidemiological information. The expected result is Negative.  Fact Sheet for Patients:  EntrepreneurPulse.com.au  Fact Sheet for Healthcare Providers:  IncredibleEmployment.be  This test is no t yet approved or cleared by the [place] FDA and  has been authorized for detection and/or diagnosis of SARS-CoV-2 by FDA under an Emergency Use Authorization (EUA). This EUA will remain  in effect (meaning this test can be used) for the duration of the COVID-19 declaration under Section 564(b)(1) of the Act, 21 U.S.C.section 360bbb-3(b)(1), unless the authorization is terminated  or revoked sooner.       Influenza Maclane Holloran by PCR NEGATIVE NEGATIVE Final   Influenza B by PCR NEGATIVE NEGATIVE Final    Comment: (NOTE) The Xpert Xpress SARS-CoV-2/FLU/RSV plus assay is intended as an aid in the diagnosis of influenza from Nasopharyngeal swab specimens and should not be used as Edell Mesenbrink sole basis for treatment. Nasal washings and aspirates are unacceptable for Xpert Xpress SARS-CoV-2/FLU/RSV testing.  Fact Sheet for Patients: EntrepreneurPulse.com.au  Fact Sheet for Healthcare Providers: IncredibleEmployment.be  This test is not yet approved or cleared by the [place] FDA and has been authorized for detection and/or diagnosis of SARS-CoV-2 by FDA under an Emergency Use Authorization (EUA). This EUA will remain in effect (meaning this test can be used) for the duration of the COVID-19 declaration under Section 564(b)(1) of the Act,  21 U.S.C. section 360bbb-3(b)(1), unless the authorization is terminated or revoked.  Performed at [facility], [place] [address], [place], [place] [address]   Blood Culture (routine x 2)     Status: None (Preliminary result)   Collection Time: 10/09/20  1:07 AM   Specimen: BLOOD  Result Value Ref Range Status   Specimen Description BLOOD RIGHT ANTECUBITAL  Final   Special Requests   Final    BOTTLES DRAWN AEROBIC AND ANAEROBIC Blood Culture adequate volume   Culture   Final    NO GROWTH 4 DAYS Performed at [facility], [place] [address], [place], [place] [address]    Report Status PENDING  Incomplete  Blood Culture (routine x 2)     Status: None (Preliminary result)   Collection Time: 10/09/20  2:19 AM   Specimen: BLOOD  Result Value Ref Range Status   Specimen Description BLOOD SITE NOT SPECIFIED  Final   Special Requests   Final    BOTTLES DRAWN AEROBIC AND ANAEROBIC Blood Culture adequate volume   Culture   Final    NO GROWTH 4 DAYS Performed at [facility], [address] [place], [place] [address]    Report Status PENDING  Incomplete  Urine culture     Status: None   Collection Time: 10/09/20  3:14 AM   Specimen: In/Out Cath Urine  Result Value Ref Range Status   Specimen Description IN/OUT CATH URINE  Final   Special Requests NONE  Final   Culture   Final    NO GROWTH Performed at [facility], [address] [place], [place] [address]    Report Status 10/10/2020 FINAL  Final  Respiratory (~20 pathogens) panel by PCR     Status: None   Collection Time: 10/09/20  3:41 AM   Specimen: Nasopharyngeal Swab; Respiratory  Result Value Ref Range Status   Adenovirus NOT DETECTED NOT DETECTED Final   Coronavirus 229E NOT DETECTED NOT DETECTED Final    Comment: (NOTE) The Coronavirus on the Respiratory Panel, DOES NOT test for the novel  Coronavirus (2019 nCoV)    Coronavirus HKU1 NOT DETECTED NOT DETECTED Final   Coronavirus NL63 NOT DETECTED  NOT DETECTED Final   Coronavirus OC43 NOT DETECTED NOT DETECTED Final   Metapneumovirus NOT DETECTED NOT DETECTED Final   Rhinovirus / Enterovirus NOT DETECTED NOT DETECTED Final   Influenza A NOT DETECTED NOT DETECTED Final   Influenza B NOT DETECTED NOT DETECTED Final   Parainfluenza Virus 1 NOT DETECTED NOT DETECTED Final   Parainfluenza Virus 2 NOT DETECTED NOT DETECTED Final   Parainfluenza Virus 3 NOT DETECTED NOT DETECTED Final   Parainfluenza Virus 4 NOT DETECTED NOT DETECTED Final   Respiratory Syncytial Virus NOT DETECTED NOT DETECTED Final   Bordetella pertussis NOT DETECTED NOT DETECTED Final   Bordetella Parapertussis NOT DETECTED NOT DETECTED Final   Chlamydophila pneumoniae NOT DETECTED NOT DETECTED Final   Mycoplasma pneumoniae NOT DETECTED NOT DETECTED Final    Comment: Performed at [facility], [address] [place], [place] [address]  Culture, sputum-assessment     Status: None   Collection Time: 10/09/20  5:19 AM   Specimen: Sputum  Result Value Ref Range Status   Specimen Description SPUTUM  Final   Special Requests NONE  Final   Sputum evaluation   Final    THIS SPECIMEN IS ACCEPTABLE FOR SPUTUM CULTURE Performed at [facility], [address] [place], [place] [address]    Report Status 10/09/2020 FINAL  Final  Culture, Respiratory w Gram Stain     Status: None   Collection Time: 10/09/20  5:19 AM   Specimen: SPU  Result Value Ref Range Status   Specimen Description SPUTUM  Final   Special Requests NONE Reflexed from [id number]  Final   Gram Stain   Final    RARE WBC PRESENT, PREDOMINANTLY PMN RARE GRAM POSITIVE COCCI RARE GRAM NEGATIVE RODS    Culture   Final    MODERATE Normal respiratory flora-no Staph aureus or Pseudomonas seen Performed at [facility], [address] [place], [place] [address]    Report Status 10/11/2020 FINAL  Final  MRSA PCR Screening     Status: None   Collection Time: 10/09/20  7:47 AM   Specimen: Nasal  Mucosa; Nasopharyngeal  Result Value Ref Range Status   MRSA by PCR NEGATIVE NEGATIVE   Final    Comment:        The GeneXpert MRSA Assay (FDA approved for NASAL specimens only), is one component of a comprehensive MRSA colonization surveillance program. It is not intended to diagnose MRSA infection nor to guide or monitor treatment for MRSA infections. Performed at [facility], [address] [place], [place] [address]   Culture, Respiratory w Gram Stain     Status: None (Preliminary result)   Collection Time: 10/12/20  1:33 PM   Specimen: Bronchoalveolar Lavage; Respiratory  Result Value Ref Range Status   Specimen Description BRONCHIAL ALVEOLAR LAVAGE  Final   Special Requests BRONCHIAL ALVEOLAR LAVAGE  Final   Gram Stain   Final    ABUNDANT WBC PRESENT,BOTH PMN AND MONONUCLEAR NO ORGANISMS SEEN    Culture   Final    NO GROWTH < 24 HOURS Performed at [facility], [address] [place], [place] [address]    Report Status PENDING  Incomplete     Labs: Basic Metabolic Panel: Recent Labs  Lab 10/08/20 2211 10/09/20 0339 10/11/20 0436 10/12/20 0412 10/13/20 0346  NA 133* 134* 142 140 136  K 3.2* 4.1 3.8 4.1 4.1  CL 100 98 111 108 106  CO2 23 23 23 26 22  GLUCOSE 103* 109* 103* 91 125*  BUN 8 8 14 15 19  CREATININE 0.95 0.91 0.72 0.76 0.97  CALCIUM 7.9* 8.2* 8.2* 8.1* 8.3*  MG  --   --  1.9 2.1 2.3  PHOS  --   --  4.1 4.1 4.5   Liver Function Tests: Recent Labs  Lab 10/09/20 0107 10/11/20 0436 10/12/20 0412 10/13/20 0346  AST 12* 27 20 22  ALT 16 37 42 52*  ALKPHOS 48 40 44 54  BILITOT 1.0 0.4 0.5 0.5  PROT 7.0 5.6* 5.8* 6.2*  ALBUMIN 2.5* 2.2* 2.3* 2.6*   No results for input(s): LIPASE, AMYLASE in the last 168 hours. No results for input(s): AMMONIA in the last 168 hours. CBC: Recent Labs  Lab 10/08/20 2211 10/09/20 0339 10/10/20 0419 10/11/20 0436 10/12/20 0412 10/13/20 0346  WBC 32.3* 32.6* 17.3* 18.3* 21.8* 13.5*   NEUTROABS 8.1*  --   --  7.5 4.2 8.1*  HGB 14.7 14.8 12.4* 12.2* 13.0 13.0  HCT 41.9 44.0 36.0* 36.5* 38.5* 38.6*  MCV 85.9 88.4 87.8 90.3 89.3 87.9  PLT 584* 658* 540* 560* 583* 645*   Cardiac Enzymes: No results for input(s): CKTOTAL, CKMB, CKMBINDEX, TROPONINI in the last 168 hours. BNP: BNP (last 3 results) Recent Labs    09/13/20 0105 09/14/20 0153 10/09/20 0339  BNP 91.0 41.4 23.3    ProBNP (last 3 results) No results for input(s): PROBNP in the last 8760 hours.  CBG: No results for input(s): GLUCAP in the last 168 hours.     Signed:  Caldwell Powell MD.  [facility] 10/13/2020, 2:22 PM    

## 2020-10-14 LAB — CULTURE, BLOOD (ROUTINE X 2)
Culture: NO GROWTH
Culture: NO GROWTH
Special Requests: ADEQUATE
Special Requests: ADEQUATE

## 2020-10-14 LAB — CULTURE, RESPIRATORY W GRAM STAIN: Culture: NO GROWTH

## 2020-10-15 LAB — CYTOLOGY - NON PAP

## 2020-10-15 LAB — PARASITE EXAM, BLOOD

## 2020-10-15 NOTE — Anesthesia Postprocedure Evaluation (Signed)
Anesthesia Post Note  Patient: Tracy Pugh  Procedure(s) Performed: VIDEO BRONCHOSCOPY WITHOUT FLUORO WITH BRONCHOALVEOLAR LAVAGE (N/A )     Patient location during evaluation: PACU Anesthesia Type: General Level of consciousness: awake and alert Pain management: pain level controlled Vital Signs Assessment: post-procedure vital signs reviewed and stable Respiratory status: spontaneous breathing, nonlabored ventilation, respiratory function stable and patient connected to nasal cannula oxygen Cardiovascular status: blood pressure returned to baseline and stable Postop Assessment: no apparent nausea or vomiting Anesthetic complications: no   No complications documented.  Last Vitals:  Vitals:   10/13/20 0817 10/13/20 1219  BP:  111/63  Pulse:  80  Resp:  20  Temp:  37 C  SpO2: 95% 96%    Last Pain:  Vitals:   10/13/20 1219  TempSrc: Oral  PainSc: 0-No pain                 Jarell Mcewen S

## 2020-10-19 LAB — IMMUNOGLOBULINS A/E/G/M, SERUM
IgA: 203 mg/dL (ref 90–386)
IgE (Immunoglobulin E), Serum: 3348 IU/mL — ABNORMAL HIGH (ref 6–495)
IgG (Immunoglobin G), Serum: 1155 mg/dL (ref 603–1613)
IgM (Immunoglobulin M), Srm: 83 mg/dL (ref 20–172)

## 2020-10-19 LAB — IGE: IgE (Immunoglobulin E), Serum: 4660 IU/mL — ABNORMAL HIGH (ref 6–495)

## 2020-10-23 ENCOUNTER — Other Ambulatory Visit: Payer: Self-pay

## 2020-10-23 ENCOUNTER — Ambulatory Visit (INDEPENDENT_AMBULATORY_CARE_PROVIDER_SITE_OTHER): Payer: Commercial Managed Care - PPO | Admitting: Pulmonary Disease

## 2020-10-23 ENCOUNTER — Encounter: Payer: Self-pay | Admitting: Pulmonary Disease

## 2020-10-23 VITALS — BP 114/76 | HR 74 | Temp 98.4°F | Ht 66.0 in | Wt 163.6 lb

## 2020-10-23 DIAGNOSIS — J4530 Mild persistent asthma, uncomplicated: Secondary | ICD-10-CM

## 2020-10-23 DIAGNOSIS — J8281 Chronic eosinophilic pneumonia: Secondary | ICD-10-CM | POA: Diagnosis not present

## 2020-10-23 MED ORDER — SULFAMETHOXAZOLE-TRIMETHOPRIM 400-80 MG PO TABS: 1.0000 | ORAL_TABLET | ORAL | 0 refills | Status: DC

## 2020-10-23 MED ORDER — BREO ELLIPTA 200-25 MCG/INH IN AEPB: 1.0000 | INHALATION_SPRAY | Freq: Every day | RESPIRATORY_TRACT | 11 refills | Status: DC

## 2020-10-23 MED ORDER — PREDNISONE 20 MG PO TABS: ORAL_TABLET | ORAL | 0 refills | Status: DC

## 2020-10-23 NOTE — Patient Instructions (Addendum)
Nice to meet you  Continue the prednisone 40 mg daily for the next 18 days to complete a total of 4 weeks.  Then decrease the prednisone dose by 10 mg every 2 weeks.  Specific instruction should be on the prescription.  While you are taking doses of 20 mg or more, take Bactrim 1 tab every Monday Wednesday and Friday.  I provided enough pills to to cover this.  We will repeat a CT scan at the end of the month to make sure these shadows her pneumonia has gotten better.    Let me know if the breathing worsens as we come down the prednisone dose.  Continue Breo 1 puff daily.  I sent a refill for this.  If is too expensive, let me know and we will try a different inhaler that is similar.  Follow-up with Dr. Judeth Horn in 4 weeks after CT scan at the end of May or sooner as needed

## 2020-10-23 NOTE — Progress Notes (Signed)
_0  ID: Noel Rodier, male    DOB: [birth year], 40 y.o.   MRN: [id number]  Chief Complaint  Patient presents with  . Hospitalization Follow-up    Was in the hospital in mid April for SOB/asthma. States he has been doing well since being home.     Referring provider: No ref. provider found  HPI:   40 year old whom we are seeing in hospital follow-up after admission for hypoxemic restaurant failure, bilateral infiltrates, peripheral eosinophilia and eosinophil predominant BAL being treated for chronic eosinophilic pneumonia.  Multiple pulmonary notes from hospitalization reviewed.  Discharge summary reviewed.  Patient was diagnosed with COVID in 06/2020.  And symptoms had issues with ongoing cough, shortness of breath.  This worsened gradually over time to the point where he needed hospitalization March 2022.  Treated with antibiotics.  Treated with steroids.  Minimal improvement for a bit of time.  Worsening.  Subsequently readmitted 09/2020.  CT chest reviewed and interpreted as bilateral right greater than left groundglass and denser infiltrates throughout.  Chest x-ray on admission demonstrated right greater than left upper and lower lobe infiltrates on my interpretation as well.  Peripheral eosinophilia is around 21,000.  Did not abate with couple doses of steroids.  Had BAL performed which demonstrated a 5% eosinophils.  Was placed on prednisone 40 mg daily.  Reports gradual improvement in symptoms.  No cough.  Dyspnea essentially gone.  Feels well.  Reviewed labs during hospitalization which demonstrated negative ANCA, Strongyloides IgG negative, elevated ESR and CRP.  Infectious work-up negative.  IgE greatly elevated in the several thousand range.  PMH: Asthma Surgical history: Sinus surgery in 2021 Family history: Allergies in freshly relatives, otherwise no respiratory illnesses in first-degree relatives Social history: Former smoker, quit in 2015, lives in  [place] / Pulmonary Flowsheets:   ACT:  No flowsheet data found.  MMRC: mMRC Dyspnea Scale mMRC Score  10/23/2020 0    Epworth:  No flowsheet data found.  Tests:   FENO:  No results found for: NITRICOXIDE  PFT: No flowsheet data found.  WALK:  No flowsheet data found.  Imaging: Personally reviewed and as per EMR discussion this note DG Chest 2 View  Result Date: 10/08/2020 CLINICAL DATA:  Cough, shortness of breath EXAM: CHEST - 2 VIEW COMPARISON:  09/11/2020 FINDINGS: Consolidation seen throughout the right lung. Patchy left perihilar airspace opacities. Findings compatible with pneumonia. Heart is normal size. No effusions or acute bony abnormality. IMPRESSION: Patchy airspace disease throughout the right lung and in the left perihilar region compatible with multifocal pneumonia. Electronically Signed   By: Rolm Baptise M.D.   On: 10/08/2020 22:32   CT Angio Chest PE W and/or Wo Contrast  Result Date: 10/09/2020 CLINICAL DATA:  Chest pain, shortness of breath EXAM: CT ANGIOGRAPHY CHEST WITH CONTRAST TECHNIQUE: Multidetector CT imaging of the chest was performed using the standard protocol during bolus administration of intravenous contrast. Multiplanar CT image reconstructions and MIPs were obtained to evaluate the vascular anatomy. CONTRAST:  38m OMNIPAQUE IOHEXOL 350 MG/ML SOLN COMPARISON:  Chest x-ray 10/08/2020 FINDINGS: Cardiovascular: Suboptimal opacification of the pulmonary arteries. No large central pulmonary emboli. Insert Heart. Mediastinum/Nodes: Prominent mediastinal and bilateral hilar lymph nodes, likely reactive. No axillary adenopathy. Lungs/Pleura: Extensive airspace disease throughout the right upper lobe and right lower lobe. Patchy perihilar opacities in the left lung, predominantly lingula. No effusions. Upper Abdomen: Imaging into the upper abdomen demonstrates no acute findings. Musculoskeletal: Chest wall soft tissues are unremarkable.  No acute  bony abnormality. Review of the MIP images confirms the above findings. IMPRESSION: Bilateral airspace disease, right greater than left most compatible with multifocal pneumonia. Suboptimal opacification of pulmonary arteries. No large central pulmonary emboli. Electronically Signed   By: Rolm Baptise M.D.   On: 10/09/2020 02:51   ECHOCARDIOGRAM COMPLETE  Result Date: 10/12/2020    ECHOCARDIOGRAM REPORT   Patient Name:   Tracy Pugh Date of Exam: 10/12/2020 Medical Rec #:  [id number]       Height:       66.0 in Accession #:    [id number]      Weight:       159.8 lb Date of Birth:  07-28-1980       BSA:          1.818 m Patient Age:    39 years        BP:           119/85 mmHg Patient Gender: M               HR:           86 bpm. Exam Location:  Inpatient Procedure: 2D Echo, Cardiac Doppler and Color Doppler Indications:    R06.02 SOB  History:        Patient has no prior history of Echocardiogram examinations.  Sonographer:    [place] Referring Phys: [id number] [place]  1. Left ventricular ejection fraction, by estimation, is 50 to 55%. The left ventricle has low normal function. The left ventricle has no regional wall motion abnormalities. Left ventricular diastolic parameters were normal.  2. Right ventricular systolic function is normal. The right ventricular size is normal.  3. The mitral valve is normal in structure. Trivial mitral valve regurgitation. No evidence of mitral stenosis.  4. The aortic valve is normal in structure. Aortic valve regurgitation is not visualized. No aortic stenosis is present.  5. The inferior vena cava is normal in size with greater than 50% respiratory variability, suggesting right atrial pressure of 3 mmHg. FINDINGS  Left Ventricle: Left ventricular ejection fraction, by estimation, is 50 to 55%. The left ventricle has low normal function. The left ventricle has no regional wall motion abnormalities. The left ventricular internal cavity  size was normal in size. There is no left ventricular hypertrophy. Left ventricular diastolic parameters were normal. Right Ventricle: The right ventricular size is normal. No increase in right ventricular wall thickness. Right ventricular systolic function is normal. Left Atrium: Left atrial size was normal in size. Right Atrium: Right atrial size was normal in size. Pericardium: There is no evidence of pericardial effusion. Mitral Valve: The mitral valve is normal in structure. Trivial mitral valve regurgitation. No evidence of mitral valve stenosis. Tricuspid Valve: The tricuspid valve is normal in structure. Tricuspid valve regurgitation is not demonstrated. No evidence of tricuspid stenosis. Aortic Valve: The aortic valve is normal in structure. Aortic valve regurgitation is not visualized. No aortic stenosis is present. Aortic valve mean gradient measures 4.0 mmHg. Aortic valve peak gradient measures 7.1 mmHg. Aortic valve area, by VTI measures 2.99 cm. Pulmonic Valve: The pulmonic valve was normal in structure. Pulmonic valve regurgitation is trivial. No evidence of pulmonic stenosis. Aorta: The aortic root is normal in size and structure. Venous: The inferior vena cava is normal in size with greater than 50% respiratory variability, suggesting right atrial pressure of 3 mmHg. IAS/Shunts: No atrial level shunt detected by color flow Doppler.  LEFT VENTRICLE PLAX 2D LVIDd:  5.00 cm     Diastology LVIDs:         3.00 cm     LV e' medial:    9.36 cm/s LV PW:         0.80 cm     LV E/e' medial:  7.7 LV IVS:        1.20 cm     LV e' lateral:   11.40 cm/s LVOT diam:     2.10 cm     LV E/e' lateral: 6.4 LV SV:         80 LV SV Index:   44 LVOT Area:     3.46 cm  LV Volumes (MOD) LV vol d, MOD A2C: 79.6 ml LV vol d, MOD A4C: 84.4 ml LV vol s, MOD A2C: 43.4 ml LV vol s, MOD A4C: 44.8 ml LV SV MOD A2C:     36.2 ml LV SV MOD A4C:     84.4 ml LV SV MOD BP:      37.7 ml RIGHT VENTRICLE RV S prime:     12.90 cm/s  TAPSE (M-mode): 2.3 cm LEFT ATRIUM             Index LA diam:        3.20 cm 1.76 cm/m LA Vol (A2C):   36.7 ml 20.19 ml/m LA Vol (A4C):   21.8 ml 11.99 ml/m LA Biplane Vol: 29.3 ml 16.12 ml/m  AORTIC VALVE                   PULMONIC VALVE AV Area (Vmax):    3.31 cm    PV Vmax:       0.94 m/s AV Area (Vmean):   2.80 cm    PV Vmean:      84.700 cm/s AV Area (VTI):     2.99 cm    PV VTI:        0.208 m AV Vmax:           133.00 cm/s PV Peak grad:  3.5 mmHg AV Vmean:          96.900 cm/s PV Mean grad:  3.0 mmHg AV VTI:            0.266 m AV Peak Grad:      7.1 mmHg AV Mean Grad:      4.0 mmHg LVOT Vmax:         127.00 cm/s LVOT Vmean:        78.200 cm/s LVOT VTI:          0.230 m LVOT/AV VTI ratio: 0.86  AORTA Ao Root diam: 3.30 cm MITRAL VALVE MV Area (PHT): 3.48 cm    SHUNTS MV Decel Time: 218 msec    Systemic VTI:  0.23 m MV E velocity: 72.40 cm/s  Systemic Diam: 2.10 cm MV A velocity: 62.10 cm/s MV E/A ratio:  1.17 Glori Bickers MD Electronically signed by Glori Bickers MD Signature Date/Time: 10/12/2020/3:53:56 PM    Final     Lab Results:  CBC    Component Value Date/Time   WBC 13.5 (H) 10/13/2020 0346   RBC 4.39 10/13/2020 0346   HGB 13.0 10/13/2020 0346   HCT 38.6 (L) 10/13/2020 0346   PLT 645 (H) 10/13/2020 0346   MCV 87.9 10/13/2020 0346   MCH 29.6 10/13/2020 0346   MCHC 33.7 10/13/2020 0346   RDW 12.9 10/13/2020 0346   LYMPHSABS 2.1 10/13/2020 0346   MONOABS 0.9 10/13/2020 0346   EOSABS  1.9 (H) 10/13/2020 0346   BASOSABS 0.1 10/13/2020 0346    BMET    Component Value Date/Time   NA 136 10/13/2020 0346   K 4.1 10/13/2020 0346   CL 106 10/13/2020 0346   CO2 22 10/13/2020 0346   GLUCOSE 125 (H) 10/13/2020 0346   BUN 19 10/13/2020 0346   CREATININE 0.97 10/13/2020 0346   CALCIUM 8.3 (L) 10/13/2020 0346   GFRNONAA >60 10/13/2020 0346   GFRAA >60 03/13/2020 1657    BNP    Component Value Date/Time   BNP 23.3 10/09/2020 0339    ProBNP No results found for:  PROBNP  Specialty Problems      Pulmonary Problems   Mild persistent asthma without complication   Other allergic rhinitis   Acute hypoxemic respiratory failure (HCC)   Asthma exacerbation   Multifocal pneumonia      No Known Allergies   There is no immunization history on file for this patient.  Past Medical History:  Diagnosis Date  . Asthma     Tobacco History: Social History   Tobacco Use  Smoking Status Former Smoker  . Quit date: 2019  . Years since quitting: 3.3  Smokeless Tobacco Never Used   Counseling given: Not Answered   Continue to not smoke  Outpatient Encounter Medications as of 10/23/2020  Medication Sig  . albuterol (VENTOLIN HFA) 108 (90 Base) MCG/ACT inhaler INHALE 2 PUFFS INTO THE LUNGS EVERY 6 (SIX) HOURS AS NEEDED FOR WHEEZING OR SHORTNESS OF BREATH.  . Azelastine-Fluticasone 137-50 MCG/ACT SUSP Place 1 spray into the nose in the morning and at bedtime. (Patient taking differently: Place 1 spray into the nose 2 (two) times daily as needed (sinus issues).)  . cetirizine (ZYRTEC) 10 MG tablet Take 10 mg by mouth daily.  . fluticasone furoate-vilanterol (BREO ELLIPTA) 200-25 MCG/INH AEPB Inhale 1 puff into the lungs daily.  . predniSONE (DELTASONE) 20 MG tablet Take 40 mg (2 tab) for the next 18 days, then 30 mg (1.5 tab) for 14 days, then 20 mg (1 tab) for 14 days, then 10 mg (1 tab) for 14 days  . sodium chloride (OCEAN) 0.65 % SOLN nasal spray Place 1 spray into both nostrils as needed for congestion.  Derrill Memo ON 10/24/2020] sulfamethoxazole-trimethoprim (BACTRIM) 400-80 MG tablet Take 1 tablet by mouth 3 (three) times a week. Monday, Wednesday, Friday  . [DISCONTINUED] predniSONE (DELTASONE) 20 MG tablet Take 2 tablets (40 mg total) by mouth daily with breakfast for 10 days. (please follow up with pulmonology regarding taper) (Patient not taking: Reported on 10/23/2020)   No facility-administered encounter medications on file as of 10/23/2020.      Review of Systems  Review of Systems  No chest pain with exertion.  No orthopnea or PND.  No lower extremity swelling.  Comprehensive review of systems otherwise negative. Physical Exam  BP 114/76   Pulse 74   Temp 98.4 F (36.9 C) (Temporal)   Ht _0  (1.676 m)   Wt 163 lb 9.6 oz (74.2 kg)   SpO2 96% Comment: on RA  BMI 26.41 kg/m   Wt Readings from Last 5 Encounters:  10/23/20 163 lb 9.6 oz (74.2 kg)  10/13/20 158 lb 8 oz (71.9 kg)  09/10/20 170 lb (77.1 kg)  03/13/20 165 lb (74.8 kg)  02/28/20 167 lb 12.8 oz (76.1 kg)    BMI Readings from Last 5 Encounters:  10/23/20 26.41 kg/m  10/13/20 25.58 kg/m  09/10/20 27.44 kg/m  03/13/20 25.84 kg/m  02/28/20 26.28 kg/m     Physical Exam General: Well-appearing, no acute distress Eyes: EOMI, icterus Neck: Supple no JVP Cardiovascular: Regular rhythm, no murmur Pulmonary: Clear to oscillation bilaterally, no crackles or wheeze, normal work of breathing Abdomen: Nondistended, bowel sounds present MSK: No synovitis, no joint effusion Neuro: Normal gait, no weakness Psych: Low mood, full affect   Assessment & Plan:   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 Much improved symptomatically on prednisone 40 mg daily (0.5 mg/kg). Will continue total 4 weeks at this dose and repeat CT chest at that time. Expect infiltrates to improve and plan on decrease in prednisone by 10 mg every 2 weeks. Bactrim MWF for PJP ppx when on prednisone 30 mg or higher.   Asthma: preceded diagnosis as above. Script for breo sent to continue maintenance inhaler.    Return in about 4 weeks (around 11/20/2020).   Lanier Clam, MD 10/23/2020   This appointment required 60 minutes of patient care (this includes precharting, chart review, review of results, face-to-face care, etc.).

## 2020-10-25 ENCOUNTER — Institutional Professional Consult (permissible substitution): Payer: Commercial Managed Care - PPO | Admitting: Pulmonary Disease

## 2020-10-31 ENCOUNTER — Telehealth: Payer: Self-pay | Admitting: Pulmonary Disease

## 2020-11-02 NOTE — Telephone Encounter (Signed)
Rec'd forms back - Faxed forms to [employer] at [phone] email to Marisue Ivan to drop charge - pt will pay when he goes to have CT on 11/16/2020-pr

## 2020-11-05 DIAGNOSIS — Z0289 Encounter for other administrative examinations: Secondary | ICD-10-CM

## 2020-11-08 ENCOUNTER — Other Ambulatory Visit: Payer: Self-pay | Admitting: Oncology

## 2020-11-14 LAB — FUNGUS CULTURE WITH STAIN

## 2020-11-14 LAB — FUNGAL ORGANISM REFLEX

## 2020-11-14 LAB — FUNGUS CULTURE RESULT

## 2020-11-16 ENCOUNTER — Other Ambulatory Visit: Payer: Self-pay

## 2020-11-16 ENCOUNTER — Ambulatory Visit (INDEPENDENT_AMBULATORY_CARE_PROVIDER_SITE_OTHER)
Admission: RE | Admit: 2020-11-16 | Discharge: 2020-11-16 | Disposition: A | Discharge status: Home / Self Care | Payer: Commercial Managed Care - PPO | Source: Ambulatory Visit | Attending: Pulmonary Disease | Admitting: Pulmonary Disease

## 2020-11-16 DIAGNOSIS — J8281 Chronic eosinophilic pneumonia: Secondary | ICD-10-CM

## 2020-11-19 NOTE — Progress Notes (Signed)
CT shows near complete resolution of infiltrates. Continue steroids and bactrim for PJP ppx as discussed last office visit. Please let him know. Thanks!

## 2020-11-27 LAB — ACID FAST CULTURE WITH REFLEXED SENSITIVITIES (MYCOBACTERIA): Acid Fast Culture: NEGATIVE

## 2020-12-26 ENCOUNTER — Telehealth: Payer: Self-pay | Admitting: Pulmonary Disease

## 2020-12-26 NOTE — Telephone Encounter (Signed)
Pt states he is currently on 10mg  daily of Prednisone. Pt states wheezing started 3 - 4 days ago which he feels was directly related to stopping the [facility] which was done 3-4 days before wheezing started. Pt was again encouraged to refill Breo and to call office if there were any problems with getting medications.

## 2020-12-26 NOTE — Telephone Encounter (Signed)
Agree with resuming Breo. If symptoms not improved by weekend/early next week have patient call and let us know. Can we get a follow up with me or NP by end of month?

## 2020-12-26 NOTE — Telephone Encounter (Addendum)
I called and spoke with the pt and notified of response per Dr Judeth Horn. Pt verbalized understanding. Nothing further needed. I have scheduled him appt with Dr Judeth Horn for 01/17/21 at 4:30 pm. He will call sooner if needed.

## 2020-12-26 NOTE — Telephone Encounter (Signed)
How much prednisone is he on currently? when did the wheeze begin? Did it start after a prednisone reduction or with stopping the Breo?

## 2020-12-26 NOTE — Telephone Encounter (Signed)
Spoke with pt how states that he ran out of [place]. Informed the pt that 11 refills were ordered during last OV in May of this year. Pt stated he would call the pharmacy and if he had any problems he would let office know. Pt denies any cough or SOB, c/o constant wheezing. Pt is also concerned about cost of Breo (pt has paid $0 out of pocket so far) in future. Dr. Judeth Horn please advise on wheezing. Thank you.

## 2021-01-16 ENCOUNTER — Other Ambulatory Visit: Payer: Self-pay

## 2021-01-16 ENCOUNTER — Ambulatory Visit: Payer: Commercial Managed Care - PPO | Admitting: Pulmonary Disease

## 2021-01-16 ENCOUNTER — Encounter: Payer: Self-pay | Admitting: Pulmonary Disease

## 2021-01-16 ENCOUNTER — Ambulatory Visit (INDEPENDENT_AMBULATORY_CARE_PROVIDER_SITE_OTHER): Payer: Commercial Managed Care - PPO

## 2021-01-16 VITALS — BP 118/70 | HR 92 | Temp 98.0°F | Ht 66.0 in | Wt 176.8 lb

## 2021-01-16 DIAGNOSIS — J8281 Chronic eosinophilic pneumonia: Secondary | ICD-10-CM | POA: Diagnosis not present

## 2021-01-16 DIAGNOSIS — J4530 Mild persistent asthma, uncomplicated: Secondary | ICD-10-CM | POA: Diagnosis not present

## 2021-01-16 MED ORDER — PREDNISONE 10 MG PO TABS: ORAL_TABLET | ORAL | 0 refills | Status: AC

## 2021-01-16 NOTE — Patient Instructions (Addendum)
Take prednisone 20 mg x 7 days, start today, then 15 mg x 7 days, then 10 mg.  If your symptoms worsen in the meantime during decreasing doses of prednisone, please let me know.  We may need to use medicines with her shots to treat the eosinophils.  We will get a chest x-ray today compared to the chest x-ray when you were in the hospital in April.  Return to clinic in 4 weeks or sooner as needed with Dr. Judeth Horn

## 2021-01-21 NOTE — Progress Notes (Signed)
_0  ID: Tracy Pugh, male    DOB: 09/22/1980, 40 y.o.   MRN: [id number]  Chief Complaint  Patient presents with   Asthma    Wheezing in chest, coughing a lot with mucus. Times 1 week. Just finished prednisone seems to have helped. Breo inhaler helps with coughing. Worse when laying down.    Referring provider: No ref. provider found  HPI:   40 year old whom we are seeing in follow-up for chronic eosinophilic pneumonia.  Cough returned 2-3 weeks ago. Was out of breo .Resumed breo and seemed to help for a few days. But cough returned and has worsened. Noted starting last few days of prednisone 10 mg daily and has worsened since stopping prednisone. Worse at night, worse when lying down. Present during day. Mild DOE present, had been totally gone. Feels different than asthma exacerbations in past.   CXR obtained today reviewed and interpreted as coarse interstitial markings worse compared to scout film from CT chest 10/2020.   HPI at initial visit: Patient was diagnosed with COVID in 06/2020.  And symptoms had issues with ongoing cough, shortness of breath.  This worsened gradually over time to the point where he needed hospitalization March 2022.  Treated with antibiotics.  Treated with steroids.  Minimal improvement for a bit of time.  Worsening.  Subsequently readmitted 09/2020.  CT chest reviewed and interpreted as bilateral right greater than left groundglass and denser infiltrates throughout.  Chest x-ray on admission demonstrated right greater than left upper and lower lobe infiltrates on my interpretation as well.  Peripheral eosinophilia is around 21,000.  Did not abate with couple doses of steroids.  Had BAL performed which demonstrated a 5% eosinophils.  Was placed on prednisone 40 mg daily.  Reports gradual improvement in symptoms.  No cough.  Dyspnea essentially gone.  Feels well.  Reviewed labs during hospitalization which demonstrated negative ANCA, Strongyloides IgG  negative, elevated ESR and CRP.  Infectious work-up negative.  IgE greatly elevated in the several thousand range.  PMH: Asthma Surgical history: Sinus surgery in 2021 Family history: Allergies in freshly relatives, otherwise no respiratory illnesses in first-degree relatives Social history: Former smoker, quit in 2015, lives in [place] / Pulmonary Flowsheets:   ACT:  Asthma Control Test ACT Total Score  01/16/2021 15    MMRC: mMRC Dyspnea Scale mMRC Score  10/23/2020 0    Epworth:  No flowsheet data found.  Tests:   FENO:  No results found for: NITRICOXIDE  PFT: No flowsheet data found.  WALK:  No flowsheet data found.  Imaging: Personally reviewed and as per EMR discussion this note DG Chest 2 View  Result Date: 01/17/2021 CLINICAL DATA:  Cough.  History of eosinophilic pneumonia. EXAM: CHEST - 2 VIEW COMPARISON:  CT 11/16/2020.  Chest x-ray 10/08/2020, 09/11/2020. FINDINGS: Mediastinum hilar structures normal. Heart size normal. Increase interstitial markings noted bilaterally compared to prior CT of 11/16/2020. Interstitial infiltrates less prominent than noted on prior chest x-ray of 09/28/2020 and 09/11/2020. Findings consistent with recurrent pneumonitis. No pleural effusion or pneumothorax. No acute bony abnormality. IMPRESSION: Increase interstitial markings noted bilaterally compared to prior CT of 11/16/2020. Interstitial infiltrates less prominent than noted on prior chest x-ray of 09/28/2020 and 09/11/2020. Findings consistent with recurrent pneumonitis. Electronically Signed   By: Marcello Moores  Register   On: 01/17/2021 05:57     Lab Results: Personally reviewed CBC    Component Value Date/Time   WBC 13.5 (H) 10/13/2020 0346   RBC 4.39 10/13/2020  0346   HGB 13.0 10/13/2020 0346   HCT 38.6 (L) 10/13/2020 0346   PLT 645 (H) 10/13/2020 0346   MCV 87.9 10/13/2020 0346   MCH 29.6 10/13/2020 0346   MCHC 33.7 10/13/2020 0346   RDW 12.9 10/13/2020  0346   LYMPHSABS 2.1 10/13/2020 0346   MONOABS 0.9 10/13/2020 0346   EOSABS 1.9 (H) 10/13/2020 0346   BASOSABS 0.1 10/13/2020 0346    BMET    Component Value Date/Time   NA 136 10/13/2020 0346   K 4.1 10/13/2020 0346   CL 106 10/13/2020 0346   CO2 22 10/13/2020 0346   GLUCOSE 125 (H) 10/13/2020 0346   BUN 19 10/13/2020 0346   CREATININE 0.97 10/13/2020 0346   CALCIUM 8.3 (L) 10/13/2020 0346   GFRNONAA >60 10/13/2020 0346   GFRAA >60 03/13/2020 1657    BNP    Component Value Date/Time   BNP 23.3 10/09/2020 0339    ProBNP No results found for: PROBNP  Specialty Problems       Pulmonary Problems   Mild persistent asthma without complication   Other allergic rhinitis   Acute hypoxemic respiratory failure (HCC)   Asthma exacerbation   Multifocal pneumonia    No Known Allergies   There is no immunization history on file for this patient.  Past Medical History:  Diagnosis Date   Asthma     Tobacco History: Social History   Tobacco Use  Smoking Status Former   Types: Cigarettes   Start date: 2002   Quit date: 2019   Years since quitting: 3.5  Smokeless Tobacco Never   Counseling given: Not Answered   Continue to not smoke  Outpatient Encounter Medications as of 01/16/2021  Medication Sig   albuterol (VENTOLIN HFA) 108 (90 Base) MCG/ACT inhaler INHALE 2 PUFFS INTO THE LUNGS EVERY 6 (SIX) HOURS AS NEEDED FOR WHEEZING OR SHORTNESS OF BREATH.   Azelastine-Fluticasone 137-50 MCG/ACT SUSP Place 1 spray into the nose in the morning and at bedtime. (Patient taking differently: Place 1 spray into the nose 2 (two) times daily as needed (sinus issues).)   cetirizine (ZYRTEC) 10 MG tablet Take 10 mg by mouth daily.   fluticasone furoate-vilanterol (BREO ELLIPTA) 200-25 MCG/INH AEPB Inhale 1 puff into the lungs daily.   predniSONE (DELTASONE) 10 MG tablet Take 2 tablets (20 mg total) by mouth daily with breakfast for 7 days, THEN 1.5 tablets (15 mg total) daily  with breakfast for 7 days, THEN 1 tablet (10 mg total) daily with breakfast.   sodium chloride (OCEAN) 0.65 % SOLN nasal spray Place 1 spray into both nostrils as needed for congestion.   sulfamethoxazole-trimethoprim (BACTRIM) 400-80 MG tablet Take 1 tablet by mouth 3 (three) times a week. Monday, Wednesday, Friday   [DISCONTINUED] predniSONE (DELTASONE) 20 MG tablet Take 40 mg (2 tab) for the next 18 days, then 30 mg (1.5 tab) for 14 days, then 20 mg (1 tab) for 14 days, then 10 mg (1 tab) for 14 days   No facility-administered encounter medications on file as of 01/16/2021.     Review of Systems  Review of Systems  N/a Physical Exam  BP 118/70   Pulse 92   Temp 98 F (36.7 C) (Oral)   Ht _0  (1.676 m)   Wt 176 lb 12.8 oz (80.2 kg)   SpO2 95%   BMI 28.54 kg/m   Wt Readings from Last 5 Encounters:  01/16/21 176 lb 12.8 oz (80.2 kg)  10/23/20 163 lb 9.6  oz (74.2 kg)  10/13/20 158 lb 8 oz (71.9 kg)  09/10/20 170 lb (77.1 kg)  03/13/20 165 lb (74.8 kg)    BMI Readings from Last 5 Encounters:  01/16/21 28.54 kg/m  10/23/20 26.41 kg/m  10/13/20 25.58 kg/m  09/10/20 27.44 kg/m  03/13/20 25.84 kg/m     Physical Exam General: Well-appearing, no acute distress Eyes: EOMI, icterus Neck: Supple no JVP Cardiovascular: Regular rhythm, no murmur Pulmonary: Clear to oscillation bilaterally, no crackles or wheeze, normal work of breathing Abdomen: Nondistended, bowel sounds present MSK: No synovitis, no joint effusion Neuro: Normal gait, no weakness Psych: Low mood, full affect   Assessment & Plan:   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 Much improved symptomatically on prednisone 40 mg daily (0.5 mg/kg). Symptoms recurred on 10 mg of prednisone and has worsened after stopping prednisone. CXR with worsened interstitial markings consistent with recurrence. Resume prednisone 20 mg dail x 7 days, then  15 mg x 7 days, then stay on 10 mg daily. Suspect will need to use IL5 therapy to maintain remission.  Asthma: preceded diagnosis as above. Continue breo as maintenance inhaler.    Return in about 4 weeks (around 02/13/2021).   Lanier Clam, MD 01/21/2021   This appointment required 35 minutes of patient care (this includes precharting, chart review, review of results, face-to-face care, etc.).

## 2021-01-29 ENCOUNTER — Telehealth: Payer: Self-pay | Admitting: Pulmonary Disease

## 2021-01-29 MED ORDER — DOXYCYCLINE HYCLATE 100 MG PO TABS: 100.0000 mg | ORAL_TABLET | Freq: Two times a day (BID) | ORAL | 0 refills | Status: DC

## 2021-01-29 NOTE — Telephone Encounter (Signed)
Can you see if his symptoms improved when we increased back to 20 mg of prednisone daily? 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 I am ok to do doxycycline 100 mg BID x 7 days. But suspect will need to increase steroids if not improving in coming days.

## 2021-01-29 NOTE — Telephone Encounter (Signed)
Called and spoke with patient. He stated that he has been running a fever off and on since last Friday 01/25/21. He has been alternating between Tylenol and Motrin for the fever. His last know fever was yesterday before he went to bed. He will check his temp again later this morning after the Motrin has worn off.   He has taken 2 at home covid tests at work and they have both been negative.   He has a productive cough with thick greenish, yellowish phlegm. He denied any worsening SOB. He has noticed an increase in fatigue, chills and body aches.   He is still taking his prednisone. He will start the 10mg  dose tomorrow. Still using his Breo 200 inhaler.   He wants to know if he needs an abx due to the fever and cough he is having.   [facility] [place]   MH, can you please advise? Thanks!

## 2021-01-29 NOTE — Telephone Encounter (Signed)
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 Symptoms; fever, very productive cough ( very thick green/yellow mucus), sweating a lot , and body aches. Pt states that he is currently taking on prednisone taper 15 MG, will be starting 10 MG tomorrow, and Breo Ellipta inhaler, and also states that he has been managing the fever with Motrin and stated that he waits 6-8 hours and it comes back up again.   [phone] [place] [place] - [address]  [address], [place] [id number]   Pls regard; [phone]

## 2021-01-29 NOTE — Telephone Encounter (Signed)
Called and spoke with patient. He stated that he could tell a small difference in his symptoms when he was on 20mg . The symptoms did not go away completely as he was still wheezing and having a productive cough.  He did not feel like himself but he had more energy.   He verbalized understanding about the doxy. I have sent this in for him.   Will route back to MH for a FYI on the prednisone.

## 2021-01-31 NOTE — Telephone Encounter (Signed)
Patient states is feeling better. No fever anymore. Would like work note for 01/30/2001 thru 02/01/2021. Attn: Casilda Carls fax number is [phone]. Patient phone number is [phone].

## 2021-02-01 NOTE — Telephone Encounter (Signed)
Pt is calling back to check on the status of a work note for him for the days 01/30/2021-02/01/2021. Pls regard; [phone].

## 2021-02-01 NOTE — Telephone Encounter (Signed)
Spoke with the pt  He is taking his doxy and feeling much better  Less cough and no more fever over the past 3 days  He is asking for note for work to excuse him from 8/10-8/12  He felt like it would not be a good idea to go back to work right away due to dusty environment  If this is okay, he wants the letter faxed to [phone] attn Casilda Carls

## 2021-02-13 ENCOUNTER — Other Ambulatory Visit: Payer: Self-pay

## 2021-02-13 ENCOUNTER — Ambulatory Visit: Payer: Commercial Managed Care - PPO | Admitting: Pulmonary Disease

## 2021-02-13 ENCOUNTER — Encounter: Payer: Self-pay | Admitting: Pulmonary Disease

## 2021-02-13 VITALS — BP 116/72 | HR 84 | Temp 98.0°F | Ht 66.0 in | Wt 177.0 lb

## 2021-02-13 DIAGNOSIS — J8281 Chronic eosinophilic pneumonia: Secondary | ICD-10-CM | POA: Diagnosis not present

## 2021-02-13 DIAGNOSIS — J4530 Mild persistent asthma, uncomplicated: Secondary | ICD-10-CM

## 2021-02-13 NOTE — Patient Instructions (Signed)
Follow up in 3 months.  No changes on medications.   Please call if you need [place] before 3 months.

## 2021-02-18 ENCOUNTER — Encounter: Payer: Self-pay | Admitting: Pulmonary Disease

## 2021-02-18 NOTE — Progress Notes (Signed)
_0  ID: Tracy Pugh, male    DOB: Jul 21, 1980, 40 y.o.   MRN: [id number]  Chief Complaint  Patient presents with   Follow-up    Patient reports his asthma is about the same and maybe some better.     Referring provider: No ref. provider found  HPI:   40 y.o. Tracy Pugh whom we are seeing in follow-up for chronic eosinophilic pneumonia.  And asthma.  Last visit concern for resumption or return of eosinophilic pneumonia.  Chest x-ray showed bilateral infiltrates on my review and interpretation.  Was placed back on higher doses of prednisone with slower taper.  In the interim, he developed worsening cough.,  Course of doxycycline.  This greatly improved his cough.  Overall, his dyspnea on exertion has improved with results of steroids.  Still a bit symptomatic at night when he lies down.  Cough greatly improved as well but returns him at night.  Overall he satisfied with current symptoms.  He is finishing up steroid taper.  Continues on high-dose Breo and montelukast for asthma.  Discussed again potential needing Biologics to treat his asthma and off label treatment of his pneumonia.  HPI at initial visit: Patient was diagnosed with COVID in 06/2020.  And symptoms had issues with ongoing cough, shortness of breath.  This worsened gradually over time to the point where he needed hospitalization March 2022.  Treated with antibiotics.  Treated with steroids.  Minimal improvement for a bit of time.  Worsening.  Subsequently readmitted 09/2020.  CT chest reviewed and interpreted as bilateral right greater than left groundglass and denser infiltrates throughout.  Chest x-ray on admission demonstrated right greater than left upper and lower lobe infiltrates on my interpretation as well.  Peripheral eosinophilia is around 21,000.  Did not abate with couple doses of steroids.  Had BAL performed which demonstrated a 5% eosinophils.  Was placed on prednisone 40 mg daily.  Reports gradual improvement in  symptoms.  No cough.  Dyspnea essentially gone.  Feels well.  Reviewed labs during hospitalization which demonstrated negative ANCA, Strongyloides IgG negative, elevated ESR and CRP.  Infectious work-up negative.  IgE greatly elevated in the several thousand range.  PMH: Asthma Surgical history: Sinus surgery in 2021 Family history: Allergies in freshly relatives, otherwise no respiratory illnesses in first-degree relatives Social history: Former smoker, quit in 2015, lives in [place] / Pulmonary Flowsheets:   ACT:  Asthma Control Test ACT Total Score  02/13/2021 21  01/16/2021 15    MMRC: mMRC Dyspnea Scale mMRC Score  10/23/2020 0    Epworth:  No flowsheet data found.  Tests:   FENO:  No results found for: NITRICOXIDE  PFT: No flowsheet data found.  WALK:  No flowsheet data found.  Imaging: Personally reviewed and as per EMR discussion this note No results found.   Lab Results: Personally reviewed CBC    Component Value Date/Time   WBC 13.5 (H) 10/13/2020 0346   RBC 4.39 10/13/2020 0346   HGB 13.0 10/13/2020 0346   HCT 38.6 (L) 10/13/2020 0346   PLT 645 (H) 10/13/2020 0346   MCV 87.9 10/13/2020 0346   MCH 29.6 10/13/2020 0346   MCHC 33.7 10/13/2020 0346   RDW 12.9 10/13/2020 0346   LYMPHSABS 2.1 10/13/2020 0346   MONOABS 0.9 10/13/2020 0346   EOSABS 1.9 (H) 10/13/2020 0346   BASOSABS 0.1 10/13/2020 0346    BMET    Component Value Date/Time   NA 136 10/13/2020 0346   K  4.1 10/13/2020 0346   CL 106 10/13/2020 0346   CO2 22 10/13/2020 0346   GLUCOSE 125 (H) 10/13/2020 0346   BUN 19 10/13/2020 0346   CREATININE 0.97 10/13/2020 0346   CALCIUM 8.3 (L) 10/13/2020 0346   GFRNONAA >60 10/13/2020 0346   GFRAA >60 03/13/2020 1657    BNP    Component Value Date/Time   BNP 23.3 10/09/2020 0339    ProBNP No results found for: PROBNP  Specialty Problems       Pulmonary Problems   Mild persistent asthma without complication    Other allergic rhinitis   Acute hypoxemic respiratory failure (HCC)   Asthma exacerbation   Multifocal pneumonia    No Known Allergies   There is no immunization history on file for this patient.  Past Medical History:  Diagnosis Date   Asthma     Tobacco History: Social History   Tobacco Use  Smoking Status Former   Types: Cigarettes   Start date: 2002   Quit date: 2019   Years since quitting: 3.6  Smokeless Tobacco Never   Counseling given: Not Answered   Continue to not smoke  Outpatient Encounter Medications as of 02/13/2021  Medication Sig   albuterol (VENTOLIN HFA) 108 (90 Base) MCG/ACT inhaler INHALE 2 PUFFS INTO THE LUNGS EVERY 6 (SIX) HOURS AS NEEDED FOR WHEEZING OR SHORTNESS OF BREATH.   cetirizine (ZYRTEC) 10 MG tablet Take 10 mg by mouth daily.   fluticasone furoate-vilanterol (BREO ELLIPTA) 200-25 MCG/INH AEPB Inhale 1 puff into the lungs daily.   predniSONE (DELTASONE) 10 MG tablet Take 2 tablets (20 mg total) by mouth daily with breakfast for 7 days, THEN 1.5 tablets (15 mg total) daily with breakfast for 7 days, THEN 1 tablet (10 mg total) daily with breakfast.   sodium chloride (OCEAN) 0.65 % SOLN nasal spray Place 1 spray into both nostrils as needed for congestion.   [DISCONTINUED] Azelastine-Fluticasone 137-50 MCG/ACT SUSP Place 1 spray into the nose in the morning and at bedtime. (Patient taking differently: Place 1 spray into the nose 2 (two) times daily as needed (sinus issues).)   [DISCONTINUED] amoxicillin (AMOXIL) 875 MG tablet Take 875 mg by mouth 2 (two) times daily. (Patient not taking: Reported on 02/13/2021)   [DISCONTINUED] doxycycline (VIBRA-TABS) 100 MG tablet Take 1 tablet (100 mg total) by mouth 2 (two) times daily.   [DISCONTINUED] fluticasone (FLONASE) 50 MCG/ACT nasal spray Place 1 spray into both nostrils 2 (two) times daily.   No facility-administered encounter medications on file as of 02/13/2021.     Review of Systems  Review  of Systems  N/a Physical Exam  BP 116/72 (BP Location: Left Arm, Patient Position: Sitting, Cuff Size: Large)   Pulse 84   Temp 98 F (36.7 C) (Oral)   Ht _0  (1.676 m)   Wt 177 lb (80.3 kg)   SpO2 97%   BMI 28.57 kg/m   Wt Readings from Last 5 Encounters:  02/13/21 177 lb (80.3 kg)  01/16/21 176 lb 12.8 oz (80.2 kg)  10/23/20 163 lb 9.6 oz (74.2 kg)  10/13/20 158 lb 8 oz (71.9 kg)  09/10/20 170 lb (77.1 kg)    BMI Readings from Last 5 Encounters:  02/13/21 28.57 kg/m  01/16/21 28.54 kg/m  10/23/20 26.41 kg/m  10/13/20 25.58 kg/m  09/10/20 27.44 kg/m     Physical Exam General: Well-appearing, no acute distress Eyes: EOMI, icterus Neck: Supple no JVP Cardiovascular: Regular rhythm, no murmur Pulmonary: Clear to oscillation bilaterally,  no crackles or wheeze, normal work of breathing Abdomen: Nondistended, bowel sounds present MSK: No synovitis, no joint effusion Neuro: Normal gait, no weakness Psych: Low mood, full affect   Assessment & Plan:   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 Much improved symptomatically on prednisone 40 mg daily (0.5 mg/kg). Symptoms recurred on 10 mg of prednisone during initial taper.  Chest x-ray with worsened interstitial markings.  He was placed on a slower taper at 20 mg.  He is continuing this with improved symptoms.  About to finish steroids.  Asthma: preceded diagnosis as above. Continue breo as maintenance inhaler.  Consider Biologics in the future.   Return in about 3 months (around 05/16/2021).   Lanier Clam, MD 02/18/2021

## 2021-03-07 ENCOUNTER — Telehealth: Payer: Self-pay | Admitting: Pulmonary Disease

## 2021-03-07 NOTE — Telephone Encounter (Signed)
FMLA paperwork was emailed to [employer].

## 2021-03-14 NOTE — Telephone Encounter (Signed)
Completed form ready for Dr. Judeth Horn to sign.  He will be in office on 9/27 afternoon.  No fee is owed because this is FMLA extension and patient paid fee earlier this year.

## 2021-03-19 NOTE — Telephone Encounter (Signed)
Pt seen on 02/13/21 by Dr. Judeth Horn. Will close encounter.

## 2021-03-20 ENCOUNTER — Ambulatory Visit: Payer: Commercial Managed Care - PPO | Admitting: Pulmonary Disease

## 2021-03-20 ENCOUNTER — Encounter: Payer: Self-pay | Admitting: Pulmonary Disease

## 2021-03-20 ENCOUNTER — Other Ambulatory Visit: Payer: Self-pay

## 2021-03-20 VITALS — BP 128/70 | HR 79 | Temp 98.3°F | Ht 66.0 in | Wt 176.2 lb

## 2021-03-20 DIAGNOSIS — J45901 Unspecified asthma with (acute) exacerbation: Secondary | ICD-10-CM

## 2021-03-20 MED ORDER — PREDNISONE 10 MG PO TABS: ORAL_TABLET | ORAL | 0 refills | Status: AC

## 2021-03-20 MED ORDER — ALBUTEROL SULFATE HFA 108 (90 BASE) MCG/ACT IN AERS: 2.0000 | INHALATION_SPRAY | Freq: Four times a day (QID) | RESPIRATORY_TRACT | 11 refills | Status: DC | PRN

## 2021-03-20 MED ORDER — EPINEPHRINE 0.3 MG/0.3ML IJ SOAJ: 0.3000 mg | INTRAMUSCULAR | 3 refills | Status: DC | PRN

## 2021-03-20 NOTE — Progress Notes (Signed)
_0  ID: Tracy Pugh, male    DOB: [birth year], 40 y.o.   MRN: [id number]  Chief Complaint  Patient presents with   Follow-up    Wheezing and coughing since came off prednisone Coughing up yellow stuff     Referring provider: No ref. provider found  HPI:   40 y.o. man whom we are seeing in follow-up for chronic eosinophilic pneumonia.  And asthma.  Last visit, had improved symptoms with steroid taper for second time.  Unfortunately, now with recurrent cough.  Some shortness of breath.  Productive yellow mucus.  Concern for uncontrolled asthma.  Discussed at length the role of anti-IL-5 for his persistent asthma.  Could also serve benefit given his history of chronic eosinophilic pneumonia.  Discussed risk and benefits.  Discussed need for EpiPen.  Breath understanding.  We will move forward.  HPI at initial visit: Patient was diagnosed with COVID in 06/2020.  And symptoms had issues with ongoing cough, shortness of breath.  This worsened gradually over time to the point where he needed hospitalization March 2022.  Treated with antibiotics.  Treated with steroids.  Minimal improvement for a bit of time.  Worsening.  Subsequently readmitted 09/2020.  CT chest reviewed and interpreted as bilateral right greater than left groundglass and denser infiltrates throughout.  Chest x-ray on admission demonstrated right greater than left upper and lower lobe infiltrates on my interpretation as well.  Peripheral eosinophilia is around 21,000.  Did not abate with couple doses of steroids.  Had BAL performed which demonstrated a 5% eosinophils.  Was placed on prednisone 40 mg daily.  Reports gradual improvement in symptoms.  No cough.  Dyspnea essentially gone.  Feels well.  Reviewed labs during hospitalization which demonstrated negative ANCA, Strongyloides IgG negative, elevated ESR and CRP.  Infectious work-up negative.  IgE greatly elevated in the several thousand range.  PMH: Asthma Surgical  history: Sinus surgery in 2021 Family history: Allergies in freshly relatives, otherwise no respiratory illnesses in first-degree relatives Social history: Former smoker, quit in 2015, lives in [place] / Pulmonary Flowsheets:   ACT:  Asthma Control Test ACT Total Score  02/13/2021 21  01/16/2021 15    MMRC: mMRC Dyspnea Scale mMRC Score  10/23/2020 0    Epworth:  No flowsheet data found.  Tests:   FENO:  No results found for: NITRICOXIDE  PFT: No flowsheet data found.  WALK:  No flowsheet data found.  Imaging: Personally reviewed and as per EMR discussion this note No results found.   Lab Results: Personally reviewed CBC    Component Value Date/Time   WBC 13.5 (H) 10/13/2020 0346   RBC 4.39 10/13/2020 0346   HGB 13.0 10/13/2020 0346   HCT 38.6 (L) 10/13/2020 0346   PLT 645 (H) 10/13/2020 0346   MCV 87.9 10/13/2020 0346   MCH 29.6 10/13/2020 0346   MCHC 33.7 10/13/2020 0346   RDW 12.9 10/13/2020 0346   LYMPHSABS 2.1 10/13/2020 0346   MONOABS 0.9 10/13/2020 0346   EOSABS 1.9 (H) 10/13/2020 0346   BASOSABS 0.1 10/13/2020 0346    BMET    Component Value Date/Time   NA 136 10/13/2020 0346   K 4.1 10/13/2020 0346   CL 106 10/13/2020 0346   CO2 22 10/13/2020 0346   GLUCOSE 125 (H) 10/13/2020 0346   BUN 19 10/13/2020 0346   CREATININE 0.97 10/13/2020 0346   CALCIUM 8.3 (L) 10/13/2020 0346   GFRNONAA >60 10/13/2020 0346   GFRAA >60 03/13/2020 1657  BNP    Component Value Date/Time   BNP 23.3 10/09/2020 0339    ProBNP No results found for: PROBNP  Specialty Problems       Pulmonary Problems   Mild persistent asthma without complication   Other allergic rhinitis   Acute hypoxemic respiratory failure (HCC)   Asthma exacerbation   Multifocal pneumonia    No Known Allergies   There is no immunization history on file for this patient.  Past Medical History:  Diagnosis Date   Asthma     Tobacco History: Social  History   Tobacco Use  Smoking Status Former   Types: Cigarettes   Start date: 2002   Quit date: 2019   Years since quitting: 3.7  Smokeless Tobacco Never   Counseling given: Not Answered   Continue to not smoke  Outpatient Encounter Medications as of 03/20/2021  Medication Sig   cetirizine (ZYRTEC) 10 MG tablet Take 10 mg by mouth daily.   EPINEPHrine 0.3 mg/0.3 mL IJ SOAJ injection Inject 0.3 mg into the muscle as needed for anaphylaxis.   fluticasone furoate-vilanterol (BREO ELLIPTA) 200-25 MCG/INH AEPB Inhale 1 puff into the lungs daily.   predniSONE (DELTASONE) 10 MG tablet Take 2 tablets (20 mg total) by mouth daily with breakfast for 7 days, THEN 1.5 tablets (15 mg total) daily with breakfast for 7 days, THEN 1 tablet (10 mg total) daily with breakfast for 7 days, THEN 0.5 tablets (5 mg total) daily with breakfast for 7 days.   sodium chloride (OCEAN) 0.65 % SOLN nasal spray Place 1 spray into both nostrils as needed for congestion.   [DISCONTINUED] albuterol (VENTOLIN HFA) 108 (90 Base) MCG/ACT inhaler INHALE 2 PUFFS INTO THE LUNGS EVERY 6 (SIX) HOURS AS NEEDED FOR WHEEZING OR SHORTNESS OF BREATH.   albuterol (VENTOLIN HFA) 108 (90 Base) MCG/ACT inhaler INHALE 2 PUFFS INTO THE LUNGS EVERY 6 (SIX) HOURS AS NEEDED FOR WHEEZING OR SHORTNESS OF BREATH.   No facility-administered encounter medications on file as of 03/20/2021.     Review of Systems  Review of Systems  N/a Physical Exam  BP 128/70 (BP Location: Right Arm, Cuff Size: Normal)   Pulse 79   Temp 98.3 F (36.8 C) (Oral)   Ht _0  (1.676 m)   Wt 176 lb 3.2 oz (79.9 kg)   SpO2 98%   BMI 28.44 kg/m   Wt Readings from Last 5 Encounters:  03/20/21 176 lb 3.2 oz (79.9 kg)  02/13/21 177 lb (80.3 kg)  01/16/21 176 lb 12.8 oz (80.2 kg)  10/23/20 163 lb 9.6 oz (74.2 kg)  10/13/20 158 lb 8 oz (71.9 kg)    BMI Readings from Last 5 Encounters:  03/20/21 28.44 kg/m  02/13/21 28.57 kg/m  01/16/21 28.54 kg/m   10/23/20 26.41 kg/m  10/13/20 25.58 kg/m     Physical Exam General: Well-appearing, no acute distress Eyes: EOMI, icterus Neck: Supple no JVP Cardiovascular: Regular rhythm, no murmur Pulmonary: Clear to auscultation bilaterally, no crackles or wheeze, normal work of breathing Abdomen: Nondistended, bowel sounds present MSK: No synovitis, no joint effusion Neuro: Normal gait, no weakness Psych: Low mood, full affect   Assessment & Plan:   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 Much improved symptomatically on prednisone 40 mg daily (0.5 mg/kg). Symptoms recurred on 10 mg of prednisone during initial taper.  Chest x-ray with worsened interstitial markings.  He was placed on a slower taper at 20  mg.  He had improved symptoms.  Exam improved.  Eosinophilic asthma: preceded diagnosis as above. Continue breo as maintenance inhaler.  Recurrent productive cough.  Intermittent shortness of breath.  Lungs clear on exam.  Likely uncontrolled asthma.  Start Nucala.  Will require intensive drug monitoring after first dose, patient counseled the same.  EpiPen prescribed.  Prednisone 20 mg x 7 days tapering by 5 mg every 7 days.   Return in about 2 months (around 05/20/2021).   Lanier Clam, MD 03/20/2021   I spent 41 minutes in care of patient including face-to-face visit, review of records, coordination of care.

## 2021-03-20 NOTE — Patient Instructions (Addendum)
Nice to see you again  Use prednisone as prescribed  We will start process for Nucala - we will be in touch  This is to help with the cough, shortness of breath.  Return to clinic in 2 months

## 2021-03-21 ENCOUNTER — Telehealth: Payer: Self-pay | Admitting: Pharmacist

## 2021-03-21 NOTE — Telephone Encounter (Addendum)
Submitted a Prior Authorization request to [facility] for NUCALA via CoverMyMeds. Will update once we receive a response.   Freada BergeronHart Carwin) - [phone]

## 2021-03-21 NOTE — Telephone Encounter (Signed)
Received new start paperwork for Nucala. Placing in pending PAP folder in case it is needed, though patient is commercially insured and would be eligible for copay card  Please start Nucala BIV.  Dose: 100mg  every 28 days  Dx: Eosinophilic asthma ( )  Most recent abs eos on 10/13/20 - 1900  Tracy Pugh 10/15/20, PharmD, MPH, BCPS Clinical Pharmacist (Rheumatology and Pulmonology)

## 2021-03-25 NOTE — Telephone Encounter (Signed)
Nucala Copay Card:  Member ID: [id number]  Group Number: [id number]  Rx Bin: [id number]  PCN: [id number]  Confirmation number: [id number]   Nucala in-office Copay debit card:  Member ID: [id number]  16 digit card number:  [phone] CVC # [id number]  Expiration Date: 2025-03-22

## 2021-03-25 NOTE — Telephone Encounter (Signed)
Received notification from [facility] regarding a prior authorization for NUCALA. Authorization has been APPROVED from 03/24/21 to 09/22/21.   Patient must fill through [facility]: [phone]  Authorization # [id number]

## 2021-03-25 NOTE — Telephone Encounter (Signed)
Patient scheduled for Nucala new start on 03/28/21 @3 :20pm. He picked up Epipen from his local pharmacy and has been advised to bring with him to his appointment  , PharmD, MPH, BCPS Clinical Pharmacist (Rheumatology and Pulmonology)

## 2021-03-26 NOTE — Telephone Encounter (Signed)
Dr. Judeth Horn signed the FMLA extension form and I faxed it to [facility]  fax# [phone]

## 2021-03-27 NOTE — Patient Instructions (Addendum)
Your next Nucala dose is due on 04/25/21, 05/23/21, and every 4 weeks thereafter  CONTINUE BREO ELLIPTA as prescribed. COMPLETE prednisone taper as prescribed.  Your prescription will be shipped from [facility]. Their phone number is [phone].  Please call to schedule shipment and confirm address. They will mail your medication to your home.  Your copay should be affordable. If you call the pharmacy and it is not affordable, please double-check that they are billing through your copay card as secondary coverage. That copay card information is: ID:  [id number] Group:  [id number] BIN:  [id number] PCN:  [id number]  You will need to be seen by your provider in 3 to 4 months to assess how Nucala is working for you. Please ensure you have a follow-up appointment scheduled with Dr. Judeth Horn in January 2023. Call our clinic [phone]) if you need to make this appointment since Dr. Laurena Spies availability in January may be unknown right now.  How to manage an injection site reaction: Remember the 5 C's: COUNTER - leave on the counter at least 30 minutes but up to overnight to bring medication to room temperature. This may help prevent stinging COLD - place something cold (like an ice gel pack or cold water bottle) on the injection site just before cleansing with alcohol. This may help reduce pain CLARITIN - use Claritin (generic name is loratadine) for the first two weeks of treatment or the day of, the day before, and the day after injecting. This will help to minimize injection site reactions CORTISONE CREAM - apply if injection site is irritated and itching CALL ME - if injection site reaction is bigger than the size of your fist, looks infected, blisters, or if you develop hives  Anaphylactic Reactions  Anaphylactic reactions can occur up to 24 hours after administration.  What are the signs and symptoms of anaphylaxis?  Symptoms can vary from a mild skin reaction to more severe  reactions including: Wheezing, shortness of breath, cough, chest tightness or trouble breathing Low blood pressure, dizziness, fainting, rapid of weak heartbeat, anxiety, or feeling of "impending doom" Flushing, itching, hives, or feeling warm Swelling of the throat or tongue, throat tightness, hoarse voice, or trouble swallowing  Some of these symptoms require immediate treatment, as they can be life threatening.  If you experience severe symptoms which are bolded above use your Epipen and call 911 for immediate emergency care.

## 2021-03-27 NOTE — Progress Notes (Signed)
HPI [name] presents today to [facility] to see pharmacy team for [facility] new start.  Past medical history includes chronic eosinophilic pneumonia and asthma. He had COVID diagnosis in January 2022 and symptoms worsened gradually over time to the point where he needed hospitalization in March 2022 Former smoker who quit in 2015  He has reduced dose to prednisone 15mg  once daily as prescribed and will continue prednisone taper as prescribed by Dr. .  He does have Epipen on hand  Respiratory Medications Current regimen: Breo Ellipta 200/25 mcg (1 puff once daily), cetirizine 10mg  daily Patient reports no known adherence challenges  OBJECTIVE No Known Allergies  Outpatient Encounter Medications as of 03/28/2021  Medication Sig   albuterol (VENTOLIN HFA) 108 (90 Base) MCG/ACT inhaler INHALE 2 PUFFS INTO THE LUNGS EVERY 6 (SIX) HOURS AS NEEDED FOR WHEEZING OR SHORTNESS OF BREATH.   cetirizine (ZYRTEC) 10 MG tablet Take 10 mg by mouth daily.   EPINEPHrine 0.3 mg/0.3 mL IJ SOAJ injection Inject 0.3 mg into the muscle as needed for anaphylaxis.   fluticasone furoate-vilanterol (BREO ELLIPTA) 200-25 MCG/INH AEPB Inhale 1 puff into the lungs daily.   Mepolizumab (NUCALA) 100 MG/ML SOAJ Inject 1 mL (100 mg total) into the skin every 28 (twenty-eight) days. First dose received in clinic on 03/28/21   predniSONE (DELTASONE) 10 MG tablet Take 2 tablets (20 mg total) by mouth daily with breakfast for 7 days, THEN 1.5 tablets (15 mg total) daily with breakfast for 7 days, THEN 1 tablet (10 mg total) daily with breakfast for 7 days, THEN 0.5 tablets (5 mg total) daily with breakfast for 7 days.   sodium chloride (OCEAN) 0.65 % SOLN nasal spray Place 1 spray into both nostrils as needed for congestion.   No facility-administered encounter medications on file as of 03/28/2021.    Eosinophils Most recent blood eosinophil count was 1900 cells/microL taken on 10/13/20.   IgE: 4660 on  10/11/20  Assessment   Biologics training for mepolizumab (Nucala)  Goals of therapy: Mechanism of Action: Not fully understood. It does act an interleukin-5 (IL-5) antagonist monoclonal antibody that reduces the production and survival of eosinophils by blocking the binding of IL-5 to the alpha chain of the receptor complex on the eosinophil cell surface. Reviewed that Nucala is add-on medication and patient must continue maintenance inhaler regimen. Response to therapy: may take 3 months to 6 months to determine efficacy. Discussed that patients generally feel improvement sooner than 3 months.  Side effects: headache (19%), injection site reaction (7-15%), antibody development (6%), backache (5%), fatigue (5%)  Dose: 100 mg subcutaneously every 4 weeks  Administration/Storage:  Reviewed administration sites of thigh or abdomen (at least 2-3 inches away from abdomen). Reviewed the upper arm is only appropriate if caregiver is administering injection  Do not shake the reconstituted solution as this could lead to product foaming or precipitation. Solution should be clear to opalescent and colorless to pale yellow or pale brown, essentially particle free. Small air bubbles, however, are expected and acceptable. If particulate matter remains in the solution or if the solution appears cloudy or milky, discard the solution.  Reviewed storage of medication in refrigerator. Reviewed that Nucala can be stored at room temperature in unopened carton for up to 7 days.  Access: Approval of Nucala through: insurance Patient enrolled into copay card program to help with copay assistance.  Patient's daughter administered into the back of patient's arm using sample: Nucala 100mg /ml autoinjector pen NDC: [phone] Lot:  [id number] Exp: 02/2023  He was moniored for one hour. Tolerated well. Self-reported no issue. There was small bump upon discharge but was not bothering patient. He has been trained on  injection site reaction management  Medication Reconciliation  A drug regimen assessment was performed, including review of allergies, interactions, disease-state management, dosing and immunization history. Medications were reviewed with the patient, including name, instructions, indication, goals of therapy, potential side effects, importance of adherence, and safe use.  Drug interaction(s): none noted  Immunizations  Patient is indicated for the influenzae, pneumonia, and shingles vaccinations. Patient has received no COVID19 vaccines. Plans to receive influenza vaccine through work. He does not have any questions about COVID19 vaccine at this time but was encouraged to reach out with questions if they do arise  PLAN Patient trained on how to and when to use Epipen Continue Nucala 100mg  every 4 weeks. Next dose is due 04/25/21 and every 4 weeks thereafter. Rx sent to: [facility]: [phone]. Patient provided with pharmacy phone number and advised to call later this week to schedule shipment to home. Patient provided with copay card information to provide to pharmacy if quoted copay exceeds $5 per month. Continue maintenance asthma regimen of: Breo Ellipta 200/25 mcg (1 puff once daily), cetirizine 10mg  once daily  All questions encouraged and answered.  Instructed patient to reach out with any further questions or concerns.  Thank you for allowing pharmacy to participate in this patient's care.  This appointment required 60 minutes of patient care (this includes precharting, chart review, review of results, face-to-face care, etc.).  [phone], PharmD, MPH, BCPS Clinical Pharmacist (Rheumatology and Pulmonology)

## 2021-03-28 ENCOUNTER — Ambulatory Visit: Payer: Commercial Managed Care - PPO | Admitting: Pharmacist

## 2021-03-28 ENCOUNTER — Other Ambulatory Visit: Payer: Self-pay

## 2021-03-28 DIAGNOSIS — J8281 Chronic eosinophilic pneumonia: Secondary | ICD-10-CM

## 2021-03-28 DIAGNOSIS — J4530 Mild persistent asthma, uncomplicated: Secondary | ICD-10-CM

## 2021-03-28 DIAGNOSIS — Z79899 Other long term (current) drug therapy: Secondary | ICD-10-CM

## 2021-03-28 DIAGNOSIS — Z7189 Other specified counseling: Secondary | ICD-10-CM

## 2021-03-28 MED ORDER — NUCALA 100 MG/ML ~~LOC~~ SOAJ: 100.0000 mg | SUBCUTANEOUS | 5 refills | Status: DC

## 2021-06-28 ENCOUNTER — Other Ambulatory Visit: Payer: Self-pay

## 2021-06-28 ENCOUNTER — Encounter: Payer: Self-pay | Admitting: Pulmonary Disease

## 2021-06-28 ENCOUNTER — Ambulatory Visit: Payer: Commercial Managed Care - PPO | Admitting: Pulmonary Disease

## 2021-06-28 VITALS — BP 128/76 | HR 69 | Temp 98.0°F | Ht 66.0 in | Wt 178.2 lb

## 2021-06-28 DIAGNOSIS — J4530 Mild persistent asthma, uncomplicated: Secondary | ICD-10-CM | POA: Diagnosis not present

## 2021-06-28 DIAGNOSIS — J3089 Other allergic rhinitis: Secondary | ICD-10-CM

## 2021-06-28 NOTE — Progress Notes (Signed)
_0  ID: Tracy Pugh, male    DOB: 01-Oct-1980, 41 y.o.   MRN: [id number]  Chief Complaint  Patient presents with   Follow-up    Pt states that his asthma has improved since last visit. Taking Breo everyday as ordered with no issues.     Referring provider: No ref. provider found  HPI:   41 y.o. man whom we are seeing in follow-up for chronic eosinophilic pneumonia and asthma started Nucala Fall 2022.  Most recent note from pharmacist 03/28/2021 reviewed.  Overall doing well.  Great improvement in symptoms partially nocturnal cough and dyspnea with initiation of Nucala.  Has taken 3-4 doses at this time.  Has some congestion and feels little fatigue in the morning but after taking Breo and moving about the house things improved.  No other daytime symptoms.  He describes new onset of anosmia or decreased smell in starting Breo.  No change in taste.  He plans to discuss with his ENT provider.    HPI at initial visit: Patient was diagnosed with COVID in 06/2020.  And symptoms had issues with ongoing cough, shortness of breath.  This worsened gradually over time to the point where he needed hospitalization March 2022.  Treated with antibiotics.  Treated with steroids.  Minimal improvement for a bit of time.  Worsening.  Subsequently readmitted 09/2020.  CT chest reviewed and interpreted as bilateral right greater than left groundglass and denser infiltrates throughout.  Chest x-ray on admission demonstrated right greater than left upper and lower lobe infiltrates on my interpretation as well.  Peripheral eosinophilia is around 21,000.  Did not abate with couple doses of steroids.  Had BAL performed which demonstrated a 5% eosinophils.  Was placed on prednisone 40 mg daily.  Reports gradual improvement in symptoms.  No cough.  Dyspnea essentially gone.  Feels well.  Reviewed labs during hospitalization which demonstrated negative ANCA, Strongyloides IgG negative, elevated ESR and CRP.   Infectious work-up negative.  IgE greatly elevated in the several thousand range.  PMH: Asthma Surgical history: Sinus surgery in 2021 Family history: Allergies in freshly relatives, otherwise no respiratory illnesses in first-degree relatives Social history: Former smoker, quit in 2015, lives in [place] / Pulmonary Flowsheets:   ACT:  Asthma Control Test ACT Total Score  06/28/2021 19  02/13/2021 21  01/16/2021 15    MMRC: mMRC Dyspnea Scale mMRC Score  10/23/2020 0    Epworth:  No flowsheet data found.  Tests:   FENO:  No results found for: NITRICOXIDE  PFT: No flowsheet data found.  WALK:  No flowsheet data found.  Imaging: Personally reviewed and as per EMR discussion this note No results found.   Lab Results: Personally reviewed CBC    Component Value Date/Time   WBC 13.5 (H) 10/13/2020 0346   RBC 4.39 10/13/2020 0346   HGB 13.0 10/13/2020 0346   HCT 38.6 (L) 10/13/2020 0346   PLT 645 (H) 10/13/2020 0346   MCV 87.9 10/13/2020 0346   MCH 29.6 10/13/2020 0346   MCHC 33.7 10/13/2020 0346   RDW 12.9 10/13/2020 0346   LYMPHSABS 2.1 10/13/2020 0346   MONOABS 0.9 10/13/2020 0346   EOSABS 1.9 (H) 10/13/2020 0346   BASOSABS 0.1 10/13/2020 0346    BMET    Component Value Date/Time   NA 136 10/13/2020 0346   K 4.1 10/13/2020 0346   CL 106 10/13/2020 0346   CO2 22 10/13/2020 0346   GLUCOSE 125 (H) 10/13/2020 0346  BUN 19 10/13/2020 0346   CREATININE 0.97 10/13/2020 0346   CALCIUM 8.3 (L) 10/13/2020 0346   GFRNONAA >60 10/13/2020 0346   GFRAA >60 03/13/2020 1657    BNP    Component Value Date/Time   BNP 23.3 10/09/2020 0339    ProBNP No results found for: PROBNP  Specialty Problems       Pulmonary Problems   Mild persistent asthma without complication   Other allergic rhinitis   Acute hypoxemic respiratory failure (HCC)   Asthma exacerbation   Multifocal pneumonia    No Known Allergies   There is no immunization  history on file for this patient.  Past Medical History:  Diagnosis Date   Asthma     Tobacco History: Social History   Tobacco Use  Smoking Status Former   Types: Cigarettes   Start date: 2002   Quit date: 2019   Years since quitting: 4.0  Smokeless Tobacco Never   Counseling given: Not Answered    Continue to not smoke  Outpatient Encounter Medications as of 06/28/2021  Medication Sig   albuterol (VENTOLIN HFA) 108 (90 Base) MCG/ACT inhaler INHALE 2 PUFFS INTO THE LUNGS EVERY 6 (SIX) HOURS AS NEEDED FOR WHEEZING OR SHORTNESS OF BREATH.   cetirizine (ZYRTEC) 10 MG tablet Take 10 mg by mouth daily.   EPINEPHrine 0.3 mg/0.3 mL IJ SOAJ injection Inject 0.3 mg into the muscle as needed for anaphylaxis.   fluticasone furoate-vilanterol (BREO ELLIPTA) 200-25 MCG/INH AEPB Inhale 1 puff into the lungs daily.   Mepolizumab (NUCALA) 100 MG/ML SOAJ Inject 1 mL (100 mg total) into the skin every 28 (twenty-eight) days. First dose received in clinic on 03/28/21   sodium chloride (OCEAN) 0.65 % SOLN nasal spray Place 1 spray into both nostrils as needed for congestion.   No facility-administered encounter medications on file as of 06/28/2021.     Review of Systems  Review of Systems  N/a Physical Exam  BP 128/76 (BP Location: Left Arm, Patient Position: Sitting, Cuff Size: Normal)    Pulse 69    Temp 98 F (36.7 C) (Oral)    Ht _0  (1.676 m)    Wt 178 lb 3.2 oz (80.8 kg)    SpO2 97%    BMI 28.76 kg/m   Wt Readings from Last 5 Encounters:  06/28/21 178 lb 3.2 oz (80.8 kg)  03/20/21 176 lb 3.2 oz (79.9 kg)  02/13/21 177 lb (80.3 kg)  01/16/21 176 lb 12.8 oz (80.2 kg)  10/23/20 163 lb 9.6 oz (74.2 kg)    BMI Readings from Last 5 Encounters:  06/28/21 28.76 kg/m  03/20/21 28.44 kg/m  02/13/21 28.57 kg/m  01/16/21 28.54 kg/m  10/23/20 26.41 kg/m     Physical Exam General: Well-appearing, no acute distress Eyes: EOMI, icterus Neck: Supple no JVP Cardiovascular:  Regular rhythm, no murmur Pulmonary: Clear to auscultation bilaterally, no crackles or wheeze, normal work of breathing MSK: No synovitis, no joint effusion Neuro: Normal gait, no weakness Psych: Normal mood, full affect   Assessment & Plan:   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 Much improved symptomatically on prednisone 40 mg daily (0.5 mg/kg). Symptoms recurred on 10 mg of prednisone during initial taper.  Chest x-ray with worsened interstitial markings.  He was placed on a slower taper at 20 mg.  He had improved symptoms.  Exam improved.  Eosinophilic asthma: preceded diagnosis as above. Continue breo as maintenance inhaler.  Recurrent productive cough.  Intermittent shortness of breath.  Lungs clear on exam.  Likely uncontrolled asthma.  Started Nucala fall 2022.  Greatly improved nocturnal and daytime symptoms.  Consider Trelegy in the future if symptoms were to worsen.   Return in about 3 months (around 09/26/2021).   Lanier Clam, MD 06/28/2021

## 2021-06-28 NOTE — Patient Instructions (Addendum)
Nice to see you  I am glad the nucala is helping  No changes to medications  Return to clinic in 3-4 months

## 2021-09-24 ENCOUNTER — Telehealth: Payer: Self-pay

## 2021-09-24 NOTE — Telephone Encounter (Signed)
Submitted a Prior Authorization request to [facility] for NUCALA via CoverMyMeds. Will update once we receive a response. ? ? ?Key: [id number] ?

## 2021-09-26 NOTE — Telephone Encounter (Signed)
Received notification from [facility] regarding a prior authorization for NUCALA. Authorization has been APPROVED from 09/26/2021 to 09/27/2022. Approval letter sent to scan center. ? ?Authorization # [id number] ?

## 2021-10-03 ENCOUNTER — Telehealth: Payer: Self-pay | Admitting: Pulmonary Disease

## 2021-10-03 DIAGNOSIS — J4530 Mild persistent asthma, uncomplicated: Secondary | ICD-10-CM

## 2021-10-03 NOTE — Telephone Encounter (Signed)
Refill sent for NUCALA to [facility]: [phone] ? ?Dose: 100 mg SQ every 4 weeks ? ?Last OV: 06/28/21 ?Provider: Dr. Silas Flood ? ?Next OV: supposed to be April 2023 but not scheduled ? ?Knox Saliva, PharmD, MPH, BCPS ?Clinical Pharmacist (Rheumatology and Pulmonology) ? ?

## 2021-10-03 NOTE — Telephone Encounter (Signed)
LMTCB

## 2021-10-29 IMAGING — CT CT MAXILLOFACIAL W/ CM
3 of 4 series · 15 of 47 positions shown, 18 images · IV contrast (Omnipaque)
Comparison: No pertinent prior exams are available for comparison.

CLINICAL DATA: Maxillofacial pain; recurrent sinus infection.
Additional provided: Patient reports ongoing right-sided facial pain
with ear, jaw and eye pain for 6 weeks.

EXAM:
CT MAXILLOFACIAL WITH CONTRAST
TECHNIQUE: Multidetector CT imaging of the maxillofacial structures was
performed with intravenous contrast. Multiplanar CT image
reconstructions were also generated.
CONTRAST:  75mL OMNIPAQUE IOHEXOL 300 MG/ML  SOLN

[Series 2: max soft · axial · 0.32mm/px · z∈[+1222,+1362]mm · 10 of 81 slices shown, 13 images]
[im 6/81  brain]
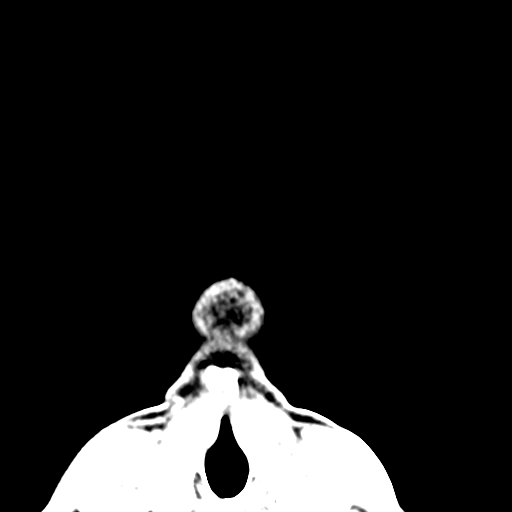
[im 6/81  bone]
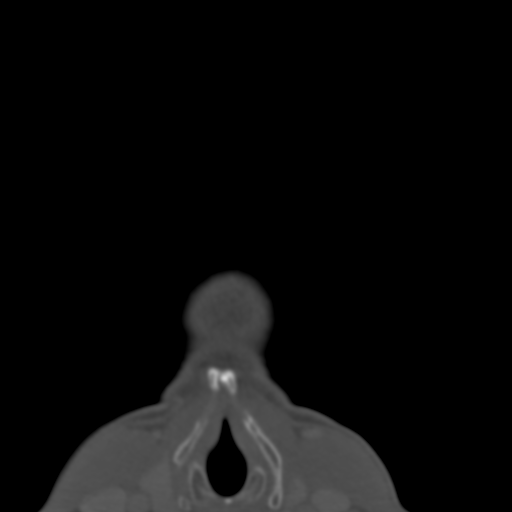
[im 14/81  bone]
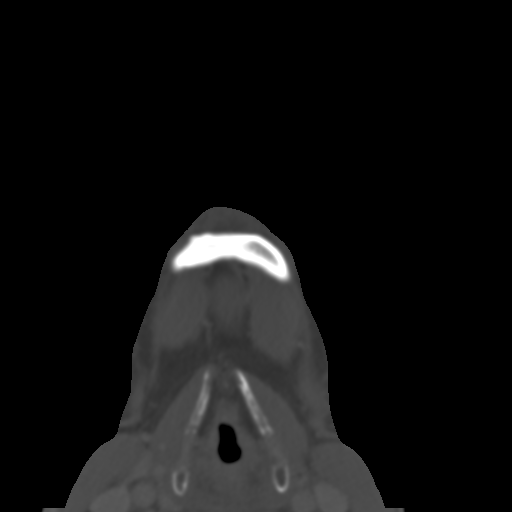
[im 23/81  bone]
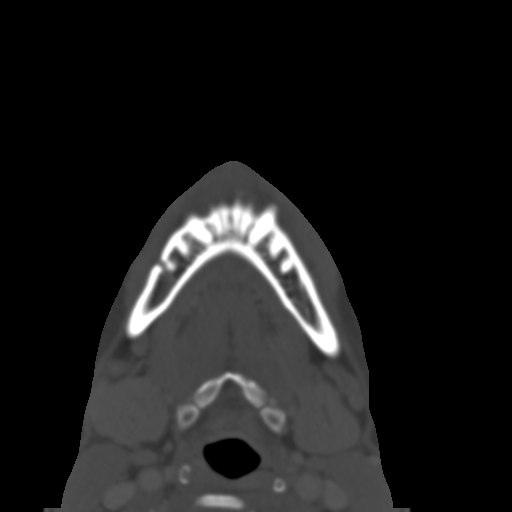
[im 28/81  bone]
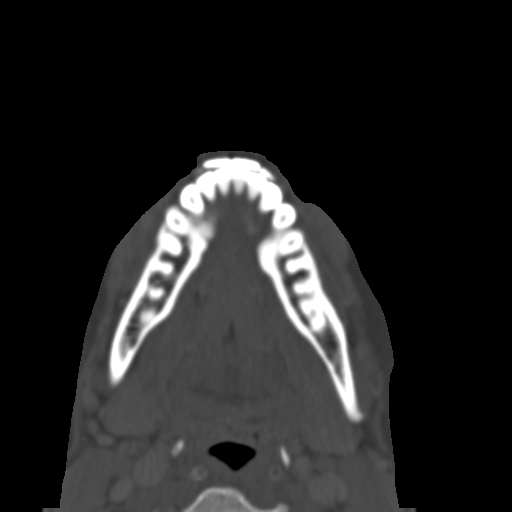
[im 36/81  brain]
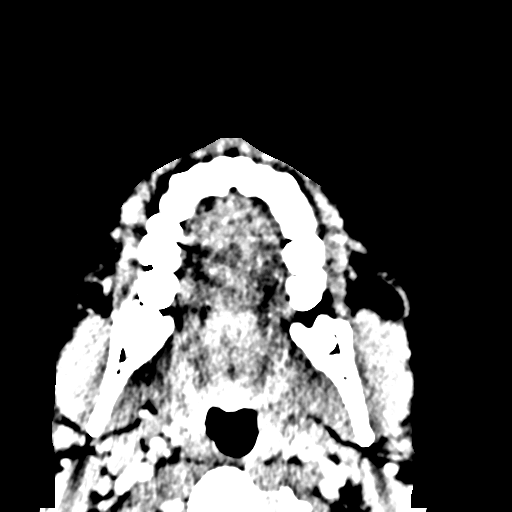
[im 36/81  bone]
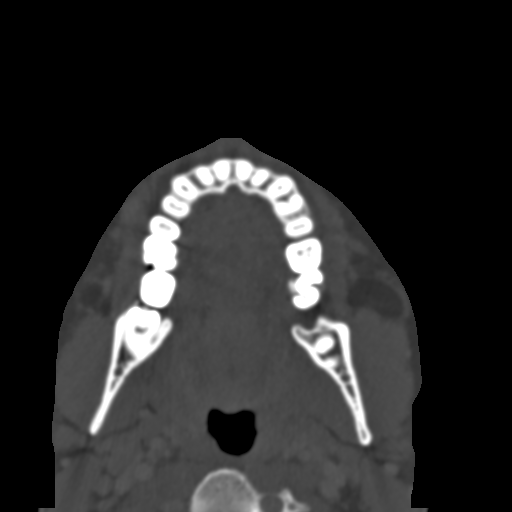
[im 45/81  bone]
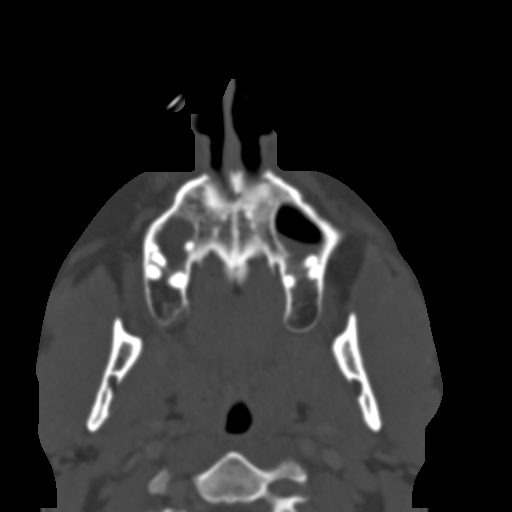
[im 53/81  bone]
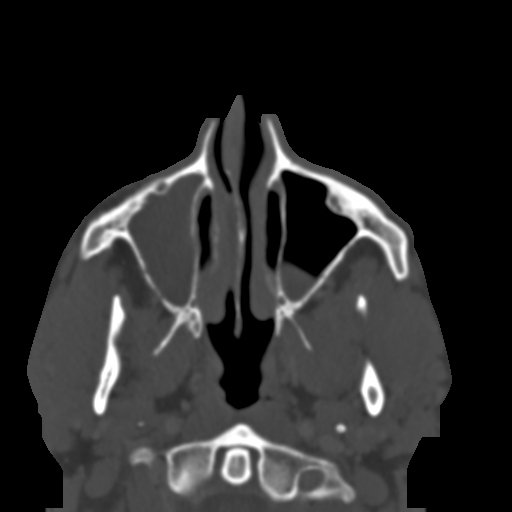
[im 61/81  bone]
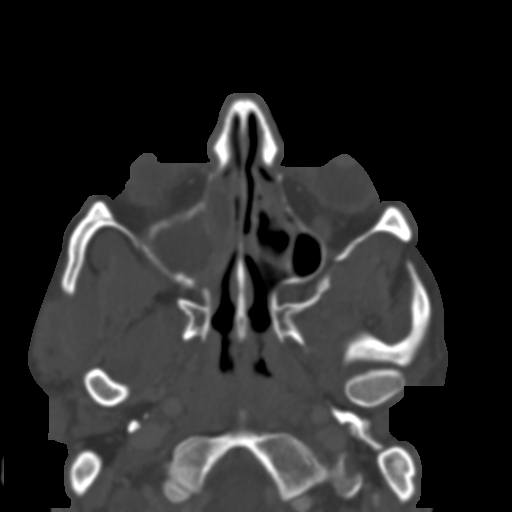
[im 67/81  brain]
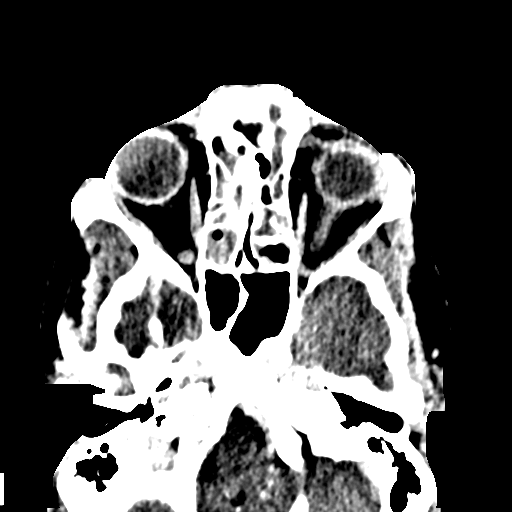
[im 67/81  bone]
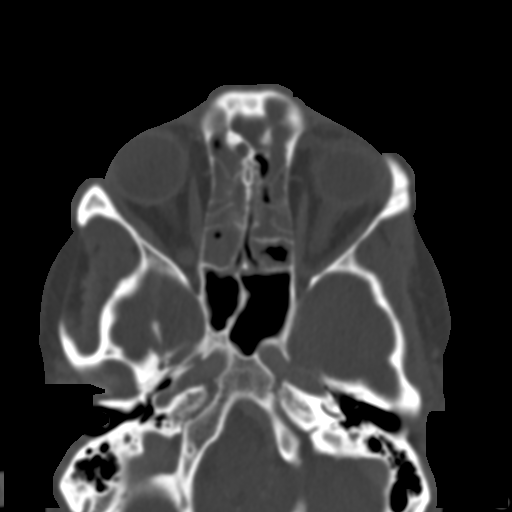
[im 75/81  bone]
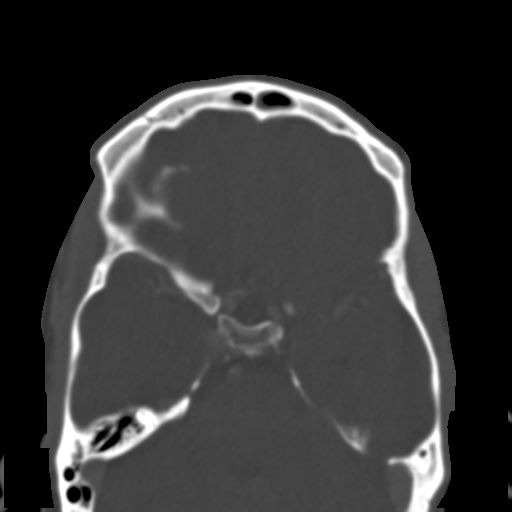

[Series 4: coronal soft · coronal · 0.35mm/px · 3 of 80 slices shown]
[im 27/80  bone]
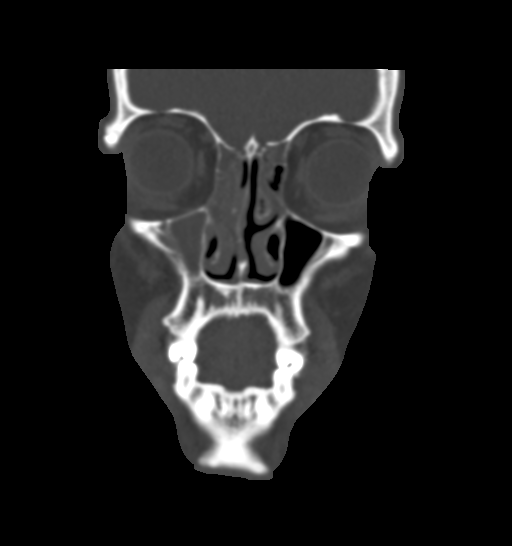
[im 36/80  bone]
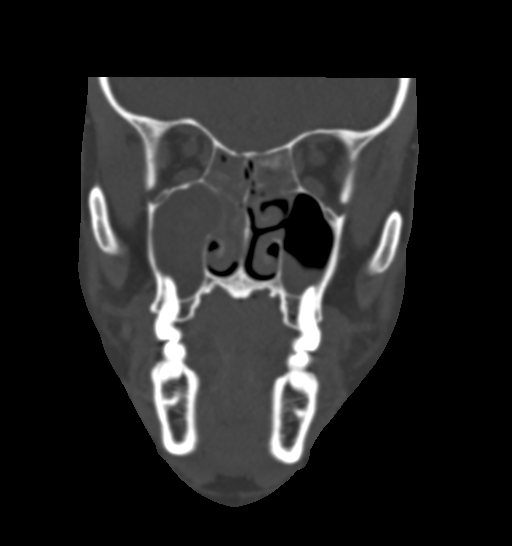
[im 44/80  bone]
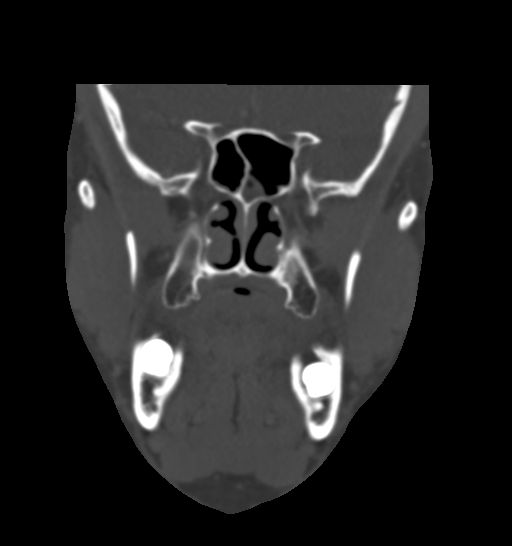

[Series 7: sagittal bone · sagittal · 0.33mm/px · 2 of 91 slices shown]
[im 31/91  bone]
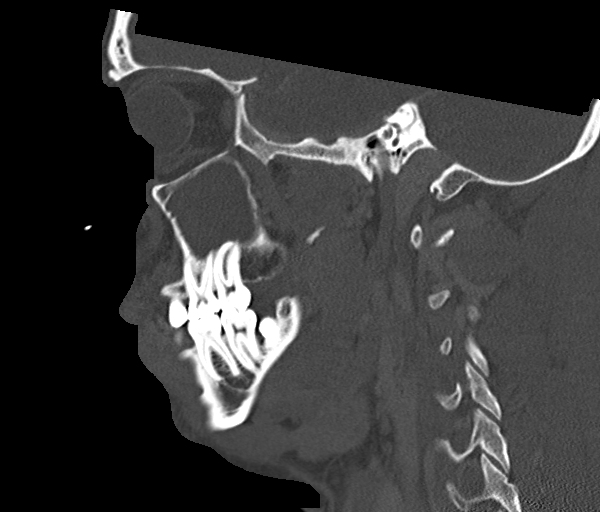
[im 61/91  bone]
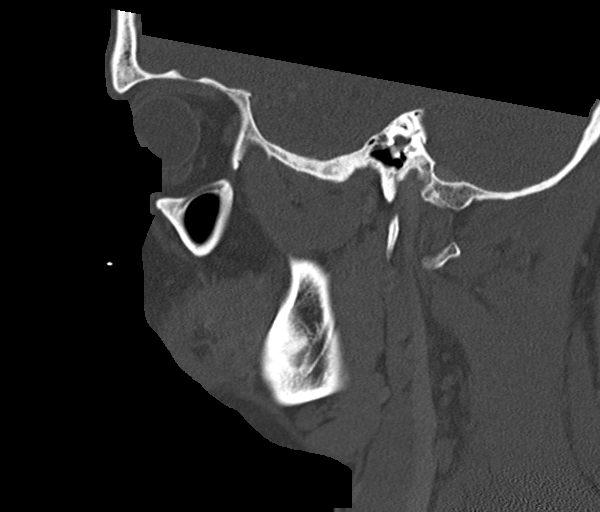

[15 of 47 positions shown; findings below may reference images not displayed]

FINDINGS: Osseous: No osseous erosion is identified.

Orbits: No acute finding. The globes are normal in size and contour.
The extraocular muscles and optic nerve sheath complexes are
symmetric and unremarkable.

Sinuses: There is extensive partial opacification of the left
frontal sinus. Mild partial opacification of the inferior right
frontal sinus. Complete opacification of the frontal sinus drainage
pathways. Severe, extensive opacification of bilateral ethmoid air
cells. Complete opacification of the right maxillary sinus. There is
possible widening of the right maxillary sinus ostium and ethmoid
infundibulum (for instance as seen on series 4, images 30-40). There
is opacification of the right hiatus semilunaris and middle meatus.
Moderate-sized mucous retention cyst versus polypoid mucosal
thickening within the inferior left maxillary sinus. The left
maxillary sinus ostium is partially obstructed by secretions. Mild
bilateral sphenoid sinus mucosal thickening

Soft tissues: The visualized maxillofacial and upper neck soft
tissues are unremarkable.

Limited intracranial: No acute intracranial abnormality is
identified.

Other: Rightward deviation of the anterior bony nasal septum.
IMPRESSION: Extensive paranasal sinus disease as outlined and with findings most
notably as follows.

There is complete opacification of the right maxillary sinus with
opacification and possible widening of the right maxillary sinus
ostium and ethmoid infundibulum. There is complete opacification of
the right hiatus semilunaris and middle meatus.

Extensive partial opacification of the left frontal sinus. Complete
opacification of the frontal sinus drainage pathways.

Extensive opacification of the bilateral ethmoid air cells.

ENT consultation should be considered.

There is no evidence of frank osseous erosion or orbital extension.

## 2021-11-28 ENCOUNTER — Other Ambulatory Visit: Payer: Self-pay | Admitting: Pulmonary Disease

## 2021-11-28 DIAGNOSIS — J4530 Mild persistent asthma, uncomplicated: Secondary | ICD-10-CM

## 2022-01-13 ENCOUNTER — Telehealth: Payer: Self-pay | Admitting: Pulmonary Disease

## 2022-01-13 DIAGNOSIS — J4530 Mild persistent asthma, uncomplicated: Secondary | ICD-10-CM

## 2022-01-14 NOTE — Telephone Encounter (Signed)
Refill sent for NUCALA to [facility]: [phone]  Dose: 100 mg SQ every 4 weeks  Last OV: 06/28/21 Provider: Dr. Jabier Mutton  Next OV: Patient has not been seen in April as was decided at last OV. Routing to scheduling team to ensure f/u appointment is made  Chesley Mires, PharmD, MPH, BCPS Clinical Pharmacist (Rheumatology and Pulmonology)

## 2022-01-14 NOTE — Telephone Encounter (Signed)
ATC pt LMTCB  

## 2022-01-20 NOTE — Telephone Encounter (Signed)
Pt has been contacted three times to schedule his overdue f/u. Called once in April to schedule OV. Called again last week and again this morning. A voicemail has been left all three times. Closing encounter.

## 2022-03-18 ENCOUNTER — Telehealth: Payer: Self-pay | Admitting: Pulmonary Disease

## 2022-03-18 DIAGNOSIS — J4530 Mild persistent asthma, uncomplicated: Secondary | ICD-10-CM

## 2022-03-18 NOTE — Telephone Encounter (Signed)
Left detailed message for pt again. Stated that we were reaching back out again to schedule an immediate follow-up  and our pharmacist has informed us that we will be unable to do any further asthma injections until he is seen in office. Left office number to call back.

## 2022-03-18 NOTE — Telephone Encounter (Signed)
Patient needs appt for further refills. Will not be sending further refills after this one refill until he is seen per our office policy

## 2022-04-21 ENCOUNTER — Ambulatory Visit: Payer: Commercial Managed Care - PPO | Admitting: Pulmonary Disease

## 2022-04-21 ENCOUNTER — Encounter: Payer: Self-pay | Admitting: Pulmonary Disease

## 2022-04-21 VITALS — BP 114/76 | HR 80 | Temp 98.0°F | Ht 66.0 in | Wt 176.6 lb

## 2022-04-21 DIAGNOSIS — J4530 Mild persistent asthma, uncomplicated: Secondary | ICD-10-CM | POA: Diagnosis not present

## 2022-04-21 MED ORDER — ALBUTEROL SULFATE HFA 108 (90 BASE) MCG/ACT IN AERS: 2.0000 | INHALATION_SPRAY | Freq: Four times a day (QID) | RESPIRATORY_TRACT | 11 refills | Status: DC | PRN

## 2022-04-21 NOTE — Patient Instructions (Addendum)
Nice to see you again  OK to stop Nucala  Continue Breo 1 puff daily  Albuterol refilled today  Return 6 months or sooner as needed

## 2022-04-21 NOTE — Progress Notes (Signed)
_0  ID: Tracy Pugh, male    DOB: Apr 09, 1981, 41 y.o.   MRN: [id number]  Chief Complaint  Patient presents with   Office Visit    PT states no changes with breathing    Referring provider: No ref. provider found  HPI:   41 y.o. 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  Multiple telephone encounters from our pharmacist in the interim since last visit reviewed.  Last seen 06/2021 with plan 3-monthfollow-up.  Now here 9 months later.  Overall doing well.  Great improvement in symptoms partially nocturnal cough and dyspnea with continuation of Nucala.  Has completed about 1 year of therapy.  Exercising more.  Breo works well.  Rare use of albuterol.  Asked about tapering or discontinuing the Nucala.  HPI at initial visit: Patient was diagnosed with COVID in 06/2020.  And symptoms had issues with ongoing cough, shortness of breath.  This worsened gradually over time to the point where he needed hospitalization March 2022.  Treated with antibiotics.  Treated with steroids.  Minimal improvement for a bit of time.  Worsening.  Subsequently readmitted 09/2020.  CT chest reviewed and interpreted as bilateral right greater than left groundglass and denser infiltrates throughout.  Chest x-ray on admission demonstrated right greater than left upper and lower lobe infiltrates on my interpretation as well.  Peripheral eosinophilia is around 21,000.  Did not abate with couple doses of steroids.  Had BAL performed which demonstrated a 5% eosinophils.  Was placed on prednisone 40 mg daily.  Reports gradual improvement in symptoms.  No cough.  Dyspnea essentially gone.  Feels well.  Reviewed labs during hospitalization which demonstrated negative ANCA, Strongyloides IgG negative, elevated ESR and CRP.  Infectious work-up negative.  IgE greatly elevated in the several thousand range.  PMH: Asthma Surgical history:  Sinus surgery in 2021 Family history: Allergies in freshly relatives, otherwise no respiratory illnesses in first-degree relatives Social history: Former smoker, quit in 2015, lives in [place]/ Pulmonary Flowsheets:   ACT:  Asthma Control Test ACT Total Score  06/28/2021  8:58 AM 19  02/13/2021  4:21 PM 21  01/16/2021  4:32 PM 15    MMRC: mMRC Dyspnea Scale mMRC Score  10/23/2020 11:03 AM 0    Epworth:      No data to display          Tests:   FENO:  No results found for: "NITRICOXIDE"  PFT:     No data to display          WALK:      No data to display          Imaging: Personally reviewed and as per EMR discussion this note No results found.   Lab Results: Personally reviewed CBC    Component Value Date/Time   WBC 13.5 (H) 10/13/2020 0346   RBC 4.39 10/13/2020 0346   HGB 13.0 10/13/2020 0346   HCT 38.6 (L) 10/13/2020 0346   PLT 645 (H) 10/13/2020 0346   MCV 87.9 10/13/2020 0346   MCH 29.6 10/13/2020 0346   MCHC 33.7 10/13/2020 0346   RDW 12.9 10/13/2020 0346   LYMPHSABS 2.1 10/13/2020 0346   MONOABS 0.9 10/13/2020 0346   EOSABS 1.9 (H) 10/13/2020 0346   BASOSABS 0.1 10/13/2020 0346    BMET    Component Value Date/Time   NA 136 10/13/2020 0346   K 4.1 10/13/2020 0346  CL 106 10/13/2020 0346   CO2 22 10/13/2020 0346   GLUCOSE 125 (H) 10/13/2020 0346   BUN 19 10/13/2020 0346   CREATININE 0.97 10/13/2020 0346   CALCIUM 8.3 (L) 10/13/2020 0346   GFRNONAA >60 10/13/2020 0346   GFRAA >60 03/13/2020 1657    BNP    Component Value Date/Time   BNP 23.3 10/09/2020 0339    ProBNP No results found for: "PROBNP"  Specialty Problems       Pulmonary Problems   Mild persistent asthma without complication   Other allergic rhinitis   Acute hypoxemic respiratory failure (HCC)   Asthma exacerbation   Multifocal pneumonia    No Known Allergies   There is no immunization history on file for this  patient.  Past Medical History:  Diagnosis Date   Asthma     Tobacco History: Social History   Tobacco Use  Smoking Status Former   Types: Cigarettes   Start date: 2002   Quit date: 2019   Years since quitting: 4.8  Smokeless Tobacco Never   Counseling given: Not Answered    Continue to not smoke  Outpatient Encounter Medications as of 04/21/2022  Medication Sig   cetirizine (ZYRTEC) 10 MG tablet Take 10 mg by mouth daily.   EPINEPHrine 0.3 mg/0.3 mL IJ SOAJ injection Inject 0.3 mg into the muscle as needed for anaphylaxis.   fluticasone furoate-vilanterol (BREO ELLIPTA) 200-25 MCG/ACT AEPB Inhale 1 puff daily into lungs   sodium chloride (OCEAN) 0.65 % SOLN nasal spray Place 1 spray into both nostrils as needed for congestion.   [DISCONTINUED] Mepolizumab (NUCALA) 100 MG/ML SOAJ Inject 1 mL (100 mg total) into the skin every 28 (twenty-eight) days. **NEEDS APPT FOR FURTHER REFILLS**   albuterol (VENTOLIN HFA) 108 (90 Base) MCG/ACT inhaler INHALE 2 PUFFS INTO THE LUNGS EVERY 6 (SIX) HOURS AS NEEDED FOR WHEEZING OR SHORTNESS OF BREATH.   [DISCONTINUED] albuterol (VENTOLIN HFA) 108 (90 Base) MCG/ACT inhaler INHALE 2 PUFFS INTO THE LUNGS EVERY 6 (SIX) HOURS AS NEEDED FOR WHEEZING OR SHORTNESS OF BREATH.   No facility-administered encounter medications on file as of 04/21/2022.     Review of Systems  Review of Systems  N/a Physical Exam  BP 114/76 (BP Location: Right Arm, Patient Position: Sitting, Cuff Size: Normal)   Pulse 80   Temp 98 F (36.7 C) (Oral)   Ht _0  (1.676 m)   Wt 176 lb 9.6 oz (80.1 kg)   SpO2 98% Comment: RA  BMI 28.50 kg/m   Wt Readings from Last 5 Encounters:  04/21/22 176 lb 9.6 oz (80.1 kg)  06/28/21 178 lb 3.2 oz (80.8 kg)  03/20/21 176 lb 3.2 oz (79.9 kg)  02/13/21 177 lb (80.3 kg)  01/16/21 176 lb 12.8 oz (80.2 kg)    BMI Readings from Last 5 Encounters:  04/21/22 28.50 kg/m  06/28/21 28.76 kg/m  03/20/21 28.44 kg/m  02/13/21  28.57 kg/m  01/16/21 28.54 kg/m     Physical Exam General: Well-appearing, no acute distress Eyes: EOMI, icterus Neck: Supple no JVP Cardiovascular: Regular rhythm, no murmur Pulmonary: Clear to auscultation bilaterally, no crackles or wheeze, normal work of breathing MSK: No synovitis, no joint effusion Neuro: Normal gait, no weakness Psych: Normal mood, full affect   Assessment & Plan:   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 Much improved symptomatically on prednisone 40 mg daily (0.5 mg/kg). Symptoms recurred on 10 mg of prednisone during  initial taper.  Chest x-ray with worsened interstitial markings.  He was placed on a slower taper at 20 mg.  He had improved symptoms.  Exam improved.  Weaned off steroids.  Nucala with ongoing improvement in symptoms.  Eosinophilic asthma: preceded diagnosis as above. Continue breo as maintenance inhaler.  Recurrent productive cough.  Intermittent shortness of breath.  Lungs clear on exam.  Likely uncontrolled asthma.  Started Nucala fall 2022, now completed 1 year of therapy.  Greatly improved nocturnal and daytime symptoms.  We will discontinue Nucala now given stable symptoms.  Continue Breo, albuterol refilled today.  He reports rare albuterol use.   Return in about 6 months (around 10/21/2022).   Lanier Clam, MD 04/21/2022

## 2022-05-26 IMAGING — DX DG CHEST 2V
2 series · 2 of 2 positions shown · non-contrast
Comparison: 09/11/2020

CLINICAL DATA: Cough, shortness of breath

EXAM:
CHEST - 2 VIEW

[chest pa]
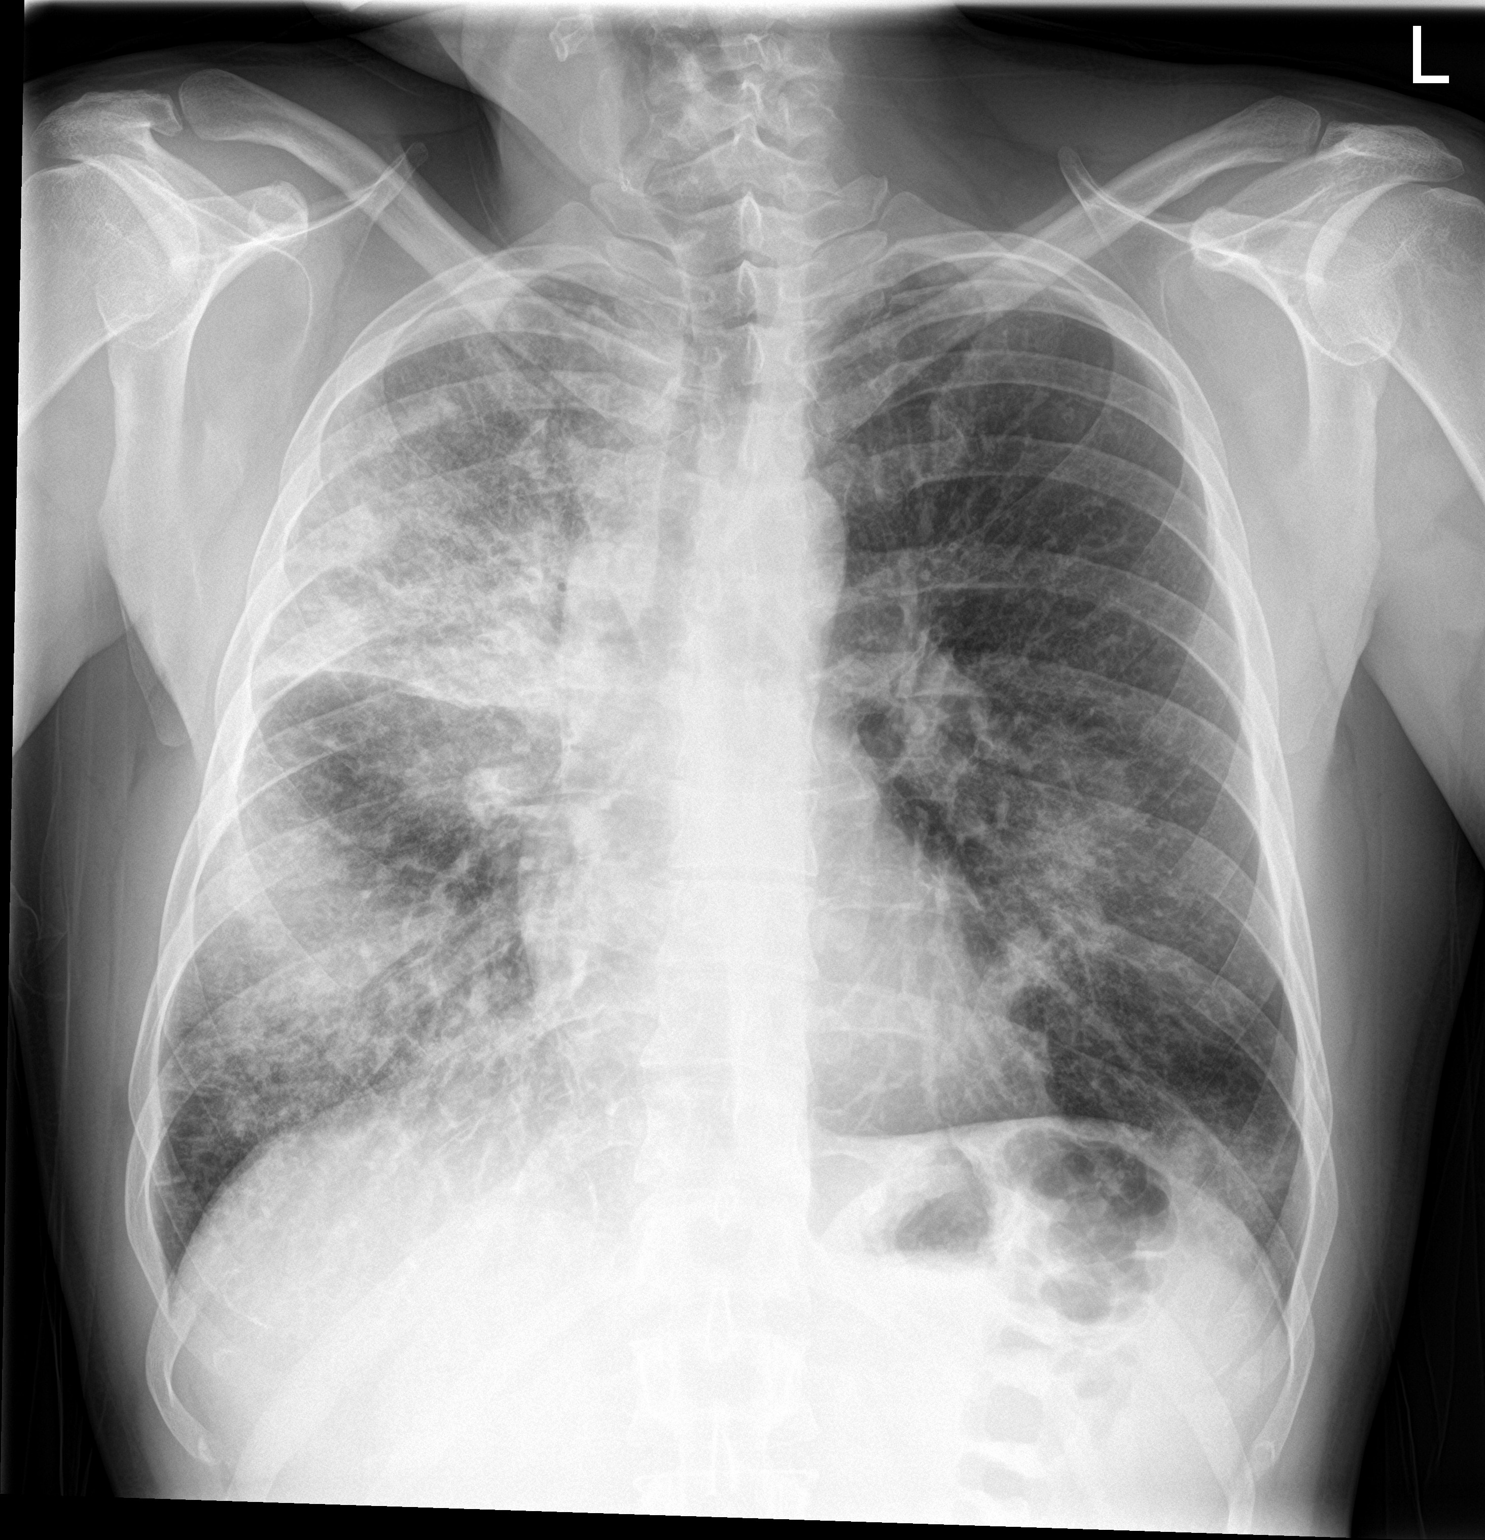

[chest lat]
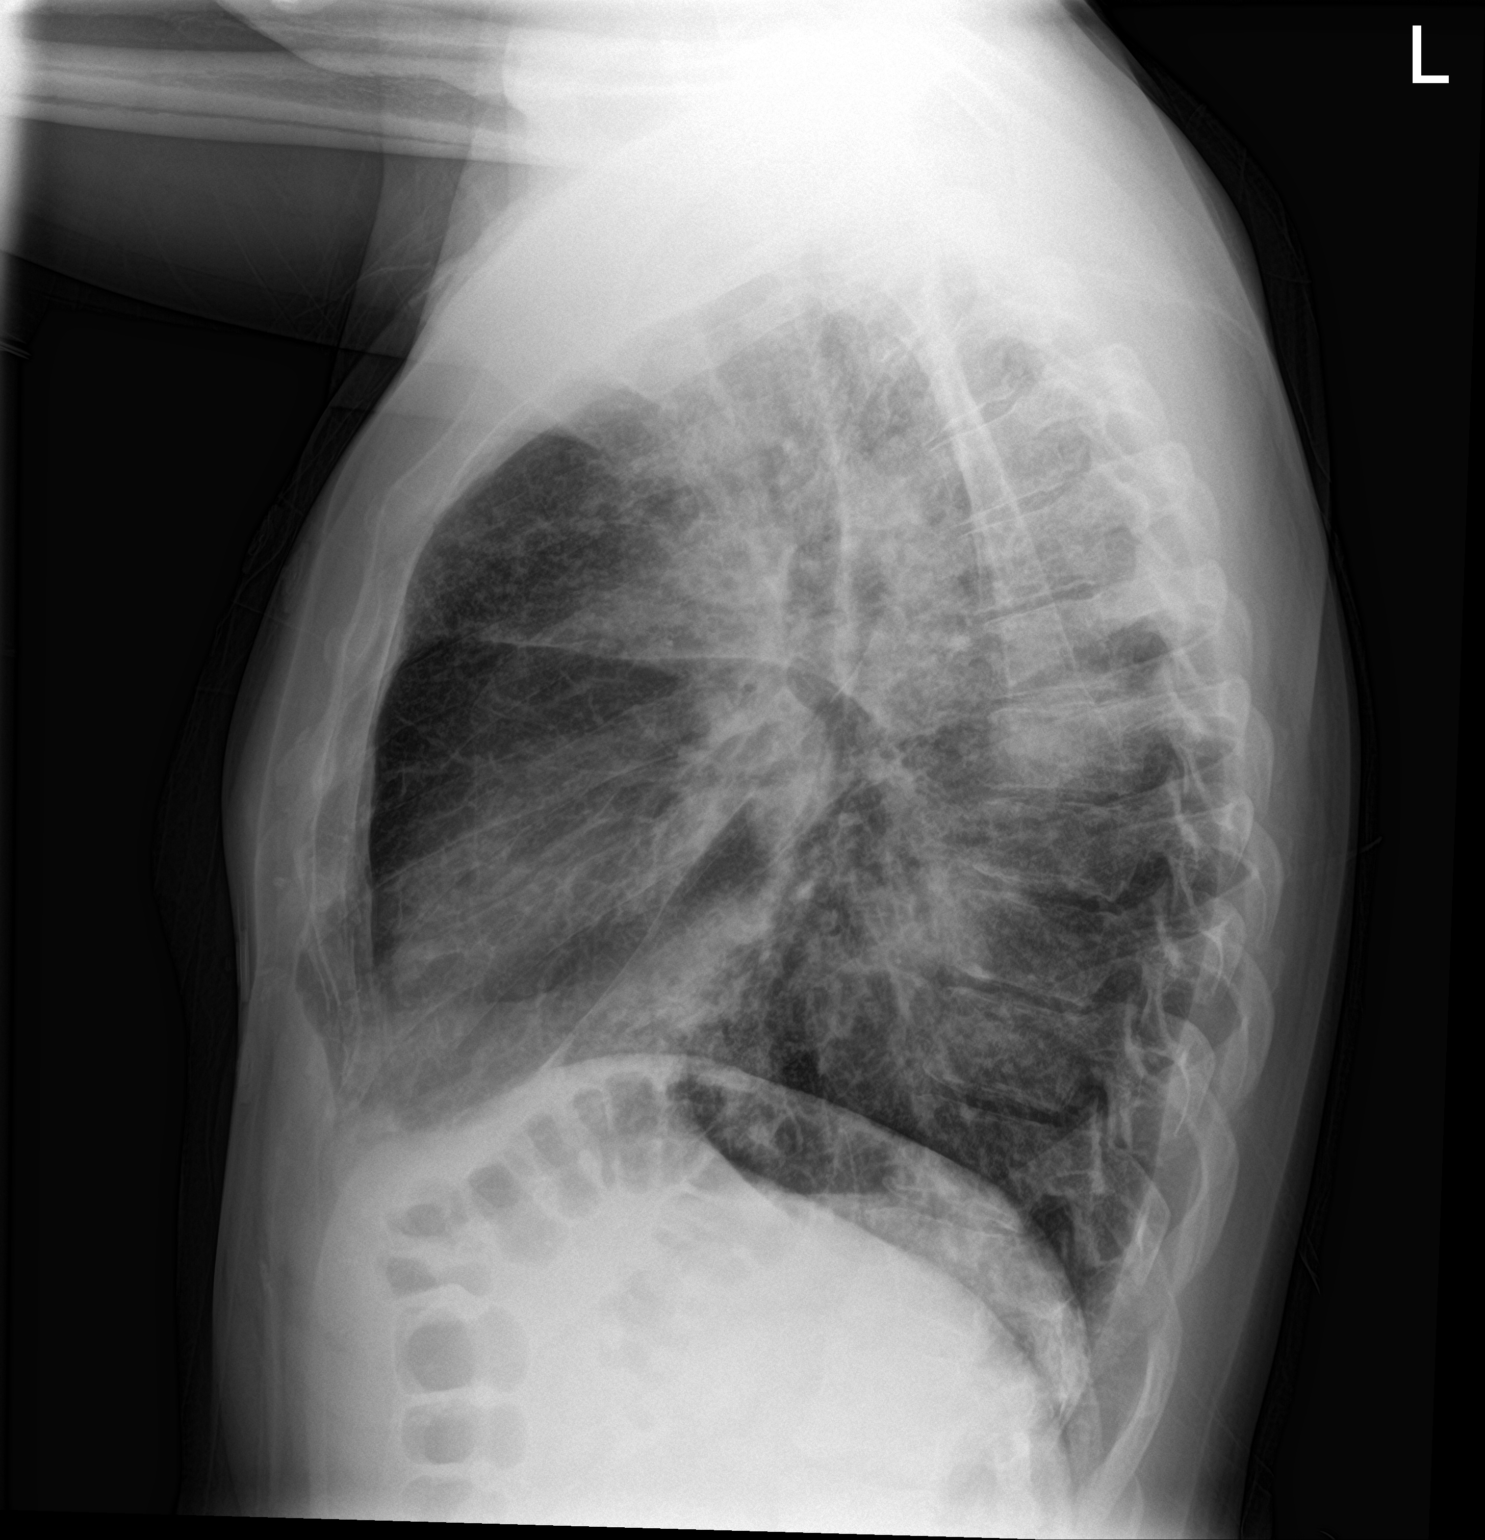

[2 of 2 positions shown; findings below may reference images not displayed]

FINDINGS: Consolidation seen throughout the right lung. Patchy left perihilar
airspace opacities. Findings compatible with pneumonia. Heart is
normal size. No effusions or acute bony abnormality.
IMPRESSION: Patchy airspace disease throughout the right lung and in the left
perihilar region compatible with multifocal pneumonia.

## 2022-11-06 ENCOUNTER — Ambulatory Visit (INDEPENDENT_AMBULATORY_CARE_PROVIDER_SITE_OTHER): Payer: Commercial Managed Care - PPO

## 2022-11-06 ENCOUNTER — Encounter: Payer: Self-pay | Admitting: Pulmonary Disease

## 2022-11-06 ENCOUNTER — Ambulatory Visit: Payer: Commercial Managed Care - PPO | Admitting: Pulmonary Disease

## 2022-11-06 VITALS — BP 116/64 | HR 73 | Temp 98.1°F | Ht 66.0 in | Wt 176.0 lb

## 2022-11-06 DIAGNOSIS — J4541 Moderate persistent asthma with (acute) exacerbation: Secondary | ICD-10-CM

## 2022-11-06 LAB — CBC WITH DIFFERENTIAL/PLATELET
Basophils Absolute: 0.2 10*3/uL — ABNORMAL HIGH (ref 0.0–0.1)
Basophils Relative: 1.3 % (ref 0.0–3.0)
Eosinophils Absolute: 2.3 10*3/uL — ABNORMAL HIGH (ref 0.0–0.7)
Eosinophils Relative: 17.8 % — ABNORMAL HIGH (ref 0.0–5.0)
HCT: 47.5 % (ref 39.0–52.0)
Hemoglobin: 16.6 g/dL (ref 13.0–17.0)
Lymphocytes Relative: 20.8 % (ref 12.0–46.0)
Lymphs Abs: 2.7 10*3/uL (ref 0.7–4.0)
MCHC: 34.9 g/dL (ref 30.0–36.0)
MCV: 90 fl (ref 78.0–100.0)
Monocytes Absolute: 1.4 10*3/uL — ABNORMAL HIGH (ref 0.1–1.0)
Monocytes Relative: 10.3 % (ref 3.0–12.0)
Neutro Abs: 6.5 10*3/uL (ref 1.4–7.7)
Neutrophils Relative %: 49.8 % (ref 43.0–77.0)
Platelets: 480 10*3/uL — ABNORMAL HIGH (ref 150.0–400.0)
RBC: 5.27 Mil/uL (ref 4.22–5.81)
RDW: 12.4 % (ref 11.5–15.5)
WBC: 13.1 10*3/uL — ABNORMAL HIGH (ref 4.0–10.5)

## 2022-11-06 MED ORDER — PREDNISONE 10 MG PO TABS: ORAL_TABLET | ORAL | 0 refills | Status: AC

## 2022-11-06 NOTE — Progress Notes (Signed)
@Patient  ID: Tracy Pugh, male    DOB: 1981-06-14, 42 y.o.   MRN: [id number]  Chief Complaint  Patient presents with   Follow-up    F/U after PNA. States he had PNA back in February 2024. Has been on several rounds of abx with no relief. Productive cough with yellow phlegm. Increased wheezing and SOB, especially at night.     Referring provider: No ref. provider found  HPI:   42 y.o. 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  Nucala discontinued follow-up 2023 after well-controlled disease.  Last seen 03/2022.  1 year stability on the call for steroid dependence/eosinophilic asthma as well as eosinophilic pneumonia.  At that time was instructed to continue Breo.  He felt well through the late fall and early winter.  Was running, even was using Breo intermittently through December and January 2024.  Started worsening February 2024.  Cough wheeze congestion shortness of breath.  Went to urgent care x 2.  Was told he had multifocal pneumonia bilaterally on chest x-ray.  Given antibiotics and steroids.  Interval improvement but quickly returned.  Second course of antibiotics and steroids completed about a month ago.  He continued to have wheeze shortness of breath cough particularly at night.  Feels different than his asthma.  Similar to when he had eosinophil pneumonia in the past.  Sometimes runs low-grade fevers around 100 F.  He is resume Breo.  Use albuterol and it does seem to help temporarily.  HPI at initial visit: Patient was diagnosed with COVID in 06/2020.  And symptoms had issues with ongoing cough, shortness of breath.  This worsened gradually over time to the point where he needed hospitalization March 2022.  Treated with antibiotics.  Treated with steroids.  Minimal improvement for a bit of time.  Worsening.  Subsequently readmitted 09/2020.  CT chest reviewed and interpreted as bilateral right  greater than left groundglass and denser infiltrates throughout.  Chest x-ray on admission demonstrated right greater than left upper and lower lobe infiltrates on my interpretation as well.  Peripheral eosinophilia is around 21,000.  Did not abate with couple doses of steroids.  Had BAL performed which demonstrated a 5% eosinophils.  Was placed on prednisone 40 mg daily.  Reports gradual improvement in symptoms.  No cough.  Dyspnea essentially gone.  Feels well.  Reviewed labs during hospitalization which demonstrated negative ANCA, Strongyloides IgG negative, elevated ESR and CRP.  Infectious work-up negative.  IgE greatly elevated in the several thousand range.  PMH: Asthma Surgical history: Sinus surgery in 2021 Family history: Allergies in freshly relatives, otherwise no respiratory illnesses in first-degree relatives Social history: Former smoker, quit in 2015, lives in [place]   Questionaires / Pulmonary Flowsheets:   ACT:  Asthma Control Test ACT Total Score  06/28/2021  8:58 AM 19  02/13/2021  4:21 PM 21  01/16/2021  4:32 PM 15    MMRC: mMRC Dyspnea Scale mMRC Score  10/23/2020 11:03 AM 0    Epworth:      No data to display          Tests:   FENO:  No results found for: "NITRICOXIDE"  PFT:     No data to display          WALK:      No data to display          Imaging: Personally reviewed and as per EMR discussion this note No  results found.   Lab Results: Personally reviewed CBC    Component Value Date/Time   WBC 13.5 (H) 10/13/2020 0346   RBC 4.39 10/13/2020 0346   HGB 13.0 10/13/2020 0346   HCT 38.6 (L) 10/13/2020 0346   PLT 645 (H) 10/13/2020 0346   MCV 87.9 10/13/2020 0346   MCH 29.6 10/13/2020 0346   MCHC 33.7 10/13/2020 0346   RDW 12.9 10/13/2020 0346   LYMPHSABS 2.1 10/13/2020 0346   MONOABS 0.9 10/13/2020 0346   EOSABS 1.9 (H) 10/13/2020 0346   BASOSABS 0.1 10/13/2020 0346    BMET    Component Value Date/Time   NA 136  10/13/2020 0346   K 4.1 10/13/2020 0346   CL 106 10/13/2020 0346   CO2 22 10/13/2020 0346   GLUCOSE 125 (H) 10/13/2020 0346   BUN 19 10/13/2020 0346   CREATININE 0.97 10/13/2020 0346   CALCIUM 8.3 (L) 10/13/2020 0346   GFRNONAA >60 10/13/2020 0346   GFRAA >60 03/13/2020 1657    BNP    Component Value Date/Time   BNP 23.3 10/09/2020 0339    ProBNP No results found for: "PROBNP"  Specialty Problems       Pulmonary Problems   Mild persistent asthma without complication   Other allergic rhinitis   Acute hypoxemic respiratory failure (HCC)   Asthma exacerbation   Multifocal pneumonia    No Known Allergies   There is no immunization history on file for this patient.  Past Medical History:  Diagnosis Date   Asthma     Tobacco History: Social History   Tobacco Use  Smoking Status Former   Types: Cigarettes   Start date: 2002   Quit date: 2019   Years since quitting: 5.3  Smokeless Tobacco Never   Counseling given: Not Answered    Continue to not smoke  Outpatient Encounter Medications as of 11/06/2022  Medication Sig   albuterol (VENTOLIN HFA) 108 (90 Base) MCG/ACT inhaler INHALE 2 PUFFS INTO THE LUNGS EVERY 6 (SIX) HOURS AS NEEDED FOR WHEEZING OR SHORTNESS OF BREATH.   cetirizine (ZYRTEC) 10 MG tablet Take 10 mg by mouth daily.   EPINEPHrine 0.3 mg/0.3 mL IJ SOAJ injection Inject 0.3 mg into the muscle as needed for anaphylaxis.   fluticasone furoate-vilanterol (BREO ELLIPTA) 200-25 MCG/ACT AEPB Inhale 1 puff daily into lungs   predniSONE (DELTASONE) 10 MG tablet Take 4 tablets (40 mg total) by mouth daily with breakfast for 7 days, THEN 3 tablets (30 mg total) daily with breakfast for 7 days, THEN 2 tablets (20 mg total) daily with breakfast for 7 days, THEN 1 tablet (10 mg total) daily with breakfast for 7 days, THEN 0.5 tablets (5 mg total) daily with breakfast for 7 days.   sodium chloride (OCEAN) 0.65 % SOLN nasal spray Place 1 spray into both nostrils  as needed for congestion.   No facility-administered encounter medications on file as of 11/06/2022.     Review of Systems  Review of Systems  N/a Physical Exam  BP 116/64   Pulse 73   Temp 98.1 F (36.7 C) (Oral)   Ht 5\' 6"  (1.676 m)   Wt 176 lb (79.8 kg)   SpO2 95% Comment: on RA  BMI 28.41 kg/m   Wt Readings from Last 5 Encounters:  11/06/22 176 lb (79.8 kg)  04/21/22 176 lb 9.6 oz (80.1 kg)  06/28/21 178 lb 3.2 oz (80.8 kg)  03/20/21 176 lb 3.2 oz (79.9 kg)  02/13/21 177 lb (80.3 kg)  BMI Readings from Last 5 Encounters:  11/06/22 28.41 kg/m  04/21/22 28.50 kg/m  06/28/21 28.76 kg/m  03/20/21 28.44 kg/m  02/13/21 28.57 kg/m     Physical Exam General: Well-appearing, no acute distress Eyes: EOMI, icterus Neck: Supple no JVP Cardiovascular: Regular rhythm, no murmur Pulmonary: Clear to auscultation bilaterally, no crackles or wheeze, normal work of breathing MSK: No synovitis, no joint effusion Neuro: Normal gait, no weakness Psych: Normal mood, full affect   Assessment & Plan:   Chronic Eosinophilic pneumonia: Diagnosed 1610 bilateral R>L infiltrates, weeks of symptoms, Eos ~21k on CBC, 85% eos on BAL. Much improved symptomatically on prednisone 40 mg daily (0.5 mg/kg). Symptoms recurred on 10 mg of prednisone during initial taper.  Chest x-ray with worsened interstitial markings.  He was placed on a slower taper at 20 mg.  He had improved symptoms.  Exam improved.  Weaned off steroids.  Eventual remission with Nucala.  Subsequent discontinued.  Recent urgent care visit 07/2022 told had pneumonia, recurrent symptoms despite 2 courses of antibiotics.  Suspicious for recurrence of chronic eosinophilic pneumonia.  Chest x-ray today, repeat lab work to evaluate for peripheral eosinophilia.  Eosinophilic asthma: preceded diagnosis as above.  With decompensation over the last couple months.  Prolonged steroid taper.  Continue Breo.  Chest x-ray as above.   Likely will need to resume Nucala, CBC with differential as above.   Return in about 2 months (around 01/06/2023).   Karren Burly, MD 11/06/2022

## 2022-11-06 NOTE — Patient Instructions (Signed)
Nice to see you again  I am sorry you are not feeling well  Will get a chest x-ray today, repeat lab work as well  I sent a prolonged steroid taper, 40 mg for 7 days, then 30 mg for 7 days, then 20 mg for 7 days, then 10 mg for 7 days, then 5 mg for 7 days  We will repeat blood work today, anticipate we may need to resume the Nucala in the coming weeks  Continue Breo in the morning, continue albuterol as needed  Return to clinic in 2 months or sooner as needed with Dr. Judeth Horn

## 2022-12-08 ENCOUNTER — Other Ambulatory Visit: Payer: Self-pay | Admitting: Pulmonary Disease

## 2022-12-08 DIAGNOSIS — J4530 Mild persistent asthma, uncomplicated: Secondary | ICD-10-CM

## 2023-01-06 ENCOUNTER — Ambulatory Visit: Payer: Commercial Managed Care - PPO | Admitting: Pulmonary Disease

## 2023-01-06 ENCOUNTER — Encounter: Payer: Self-pay | Admitting: Pulmonary Disease

## 2023-01-06 VITALS — BP 122/68 | HR 76 | Ht 66.0 in | Wt 176.0 lb

## 2023-01-06 DIAGNOSIS — J8283 Eosinophilic asthma: Secondary | ICD-10-CM

## 2023-01-06 MED ORDER — FLUTICASONE PROPIONATE 50 MCG/ACT NA SUSP: 1.0000 | Freq: Two times a day (BID) | NASAL | 3 refills | Status: AC

## 2023-01-06 NOTE — Patient Instructions (Signed)
Nice to see you again  I am glad the prednisone helped  I sent a prescription for Flonase hopefully will be cheaper than over-the-counter  Continue the nasal rinses, Flonase, antihistamines  Try Afrin 2 sprays each nostril twice a day for 3 days then stop.  This is over-the-counter.  Return to clinic in 4 months or sooner if needed with Dr. Judeth Horn

## 2023-01-06 NOTE — Progress Notes (Signed)
@Patient  ID: Tracy Pugh, male    DOB: May 19, 1981, 42 y.o.   MRN: [id number]  Chief Complaint  Patient presents with   Follow-up    2 mo f/u for asthma. States his breathing has been stable since last visit. Denies any new concerns.     Referring provider: No ref. provider found  HPI:   42 y.o. 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  Nucala discontinued follow-up 2023 after well-controlled disease.  Recurrence of symptoms with chest x-ray changes 10/2022 concerning for recurrence of eosinophilic pneumonia placed on steroid taper.  Placed on steroid taper, decrease by 10 mg every 7 days starting at 40 mg then after 10 mg of the 5 mg for 7 days.  Symptoms improved.  Chest x-ray reviewed with interstitial markings concerning for recurrence of.  Regardless certainly concern for eosinophilic asthma once again.  Symptoms improved with steroid taper.  Eosinophils markedly elevated 2.3K on most recent labs.  Chest x-ray with some mild interstitial bronchitic changes.  Symptoms much better with prednisone taper.  Noticed a little bit of congestion worsening when he went to 5 mg dose.  Now off prednisone and feels like things are doing okay.  Significant nasal congestion.  HPI at initial visit: Patient was diagnosed with COVID in 06/2020.  And symptoms had issues with ongoing cough, shortness of breath.  This worsened gradually over time to the point where he needed hospitalization March 2022.  Treated with antibiotics.  Treated with steroids.  Minimal improvement for a bit of time.  Worsening.  Subsequently readmitted 09/2020.  CT chest reviewed and interpreted as bilateral right greater than left groundglass and denser infiltrates throughout.  Chest x-ray on admission demonstrated right greater than left upper and lower lobe infiltrates on my interpretation as well.  Peripheral eosinophilia is around 21,000.  Did  not abate with couple doses of steroids.  Had BAL performed which demonstrated a 5% eosinophils.  Was placed on prednisone 40 mg daily.  Reports gradual improvement in symptoms.  No cough.  Dyspnea essentially gone.  Feels well.  Reviewed labs during hospitalization which demonstrated negative ANCA, Strongyloides IgG negative, elevated ESR and CRP.  Infectious work-up negative.  IgE greatly elevated in the several thousand range.  PMH: Asthma Surgical history: Sinus surgery in 2021 Family history: Allergies in freshly relatives, otherwise no respiratory illnesses in first-degree relatives Social history: Former smoker, quit in 2015, lives in [place]   Questionaires / Pulmonary Flowsheets:   ACT:  Asthma Control Test ACT Total Score  06/28/2021  8:58 AM 19  02/13/2021  4:21 PM 21  01/16/2021  4:32 PM 15    MMRC: mMRC Dyspnea Scale mMRC Score  10/23/2020 11:03 AM 0    Epworth:      No data to display          Tests:   FENO:  No results found for: "NITRICOXIDE"  PFT:     No data to display          WALK:      No data to display          Imaging: Personally reviewed and as per EMR discussion this note No results found.   Lab Results: Personally reviewed CBC    Component Value Date/Time   WBC 13.1 (H) 11/06/2022 0929   RBC 5.27 11/06/2022 0929   HGB 16.6 11/06/2022 0929   HCT 47.5 11/06/2022 0929   PLT 480.0 (  H) 11/06/2022 0929   MCV 90.0 11/06/2022 0929   MCH 29.6 10/13/2020 0346   MCHC 34.9 11/06/2022 0929   RDW 12.4 11/06/2022 0929   LYMPHSABS 2.7 11/06/2022 0929   MONOABS 1.4 (H) 11/06/2022 0929   EOSABS 2.3 (H) 11/06/2022 0929   BASOSABS 0.2 (H) 11/06/2022 0929    BMET    Component Value Date/Time   NA 136 10/13/2020 0346   K 4.1 10/13/2020 0346   CL 106 10/13/2020 0346   CO2 22 10/13/2020 0346   GLUCOSE 125 (H) 10/13/2020 0346   BUN 19 10/13/2020 0346   CREATININE 0.97 10/13/2020 0346   CALCIUM 8.3 (L) 10/13/2020 0346    GFRNONAA >60 10/13/2020 0346   GFRAA >60 03/13/2020 1657    BNP    Component Value Date/Time   BNP 23.3 10/09/2020 0339    ProBNP No results found for: "PROBNP"  Specialty Problems       Pulmonary Problems   Mild persistent asthma without complication   Other allergic rhinitis   Acute hypoxemic respiratory failure (HCC)   Asthma exacerbation   Multifocal pneumonia    No Known Allergies   There is no immunization history on file for this patient.  Past Medical History:  Diagnosis Date   Asthma     Tobacco History: Social History   Tobacco Use  Smoking Status Former   Current packs/day: 0.00   Types: Cigarettes   Start date: 2002   Quit date: 2019   Years since quitting: 5.5  Smokeless Tobacco Never   Counseling given: Not Answered    Continue to not smoke  Outpatient Encounter Medications as of 01/06/2023  Medication Sig   albuterol (VENTOLIN HFA) 108 (90 Base) MCG/ACT inhaler INHALE 2 PUFFS INTO THE LUNGS EVERY 6 (SIX) HOURS AS NEEDED FOR WHEEZING OR SHORTNESS OF BREATH.   BREO ELLIPTA 200-25 MCG/ACT AEPB INHALE 1 PUFF DAILY INTO LUNGS   cetirizine (ZYRTEC) 10 MG tablet Take 10 mg by mouth daily.   EPINEPHrine 0.3 mg/0.3 mL IJ SOAJ injection Inject 0.3 mg into the muscle as needed for anaphylaxis.   fluticasone (FLONASE ALLERGY RELIEF) 50 MCG/ACT nasal spray Place 1 spray into both nostrils 2 (two) times daily.   sodium chloride (OCEAN) 0.65 % SOLN nasal spray Place 1 spray into both nostrils as needed for congestion.   No facility-administered encounter medications on file as of 01/06/2023.     Review of Systems  Review of Systems  N/a Physical Exam  BP 122/68   Pulse 76   Ht 5\' 6"  (1.676 m)   Wt 176 lb (79.8 kg)   SpO2 96% Comment: on RA  BMI 28.41 kg/m   Wt Readings from Last 5 Encounters:  01/06/23 176 lb (79.8 kg)  11/06/22 176 lb (79.8 kg)  04/21/22 176 lb 9.6 oz (80.1 kg)  06/28/21 178 lb 3.2 oz (80.8 kg)  03/20/21 176 lb 3.2 oz  (79.9 kg)    BMI Readings from Last 5 Encounters:  01/06/23 28.41 kg/m  11/06/22 28.41 kg/m  04/21/22 28.50 kg/m  06/28/21 28.76 kg/m  03/20/21 28.44 kg/m     Physical Exam General: Well-appearing, no acute distress Eyes: EOMI, icterus Neck: Supple no JVP Cardiovascular: Regular rhythm, no murmur Pulmonary: Clear to auscultation bilaterally, no crackles or wheeze, normal work of breathing MSK: No synovitis, no joint effusion Neuro: Normal gait, no weakness Psych: Normal mood, full affect   Assessment & Plan:   Chronic Eosinophilic pneumonia: Diagnosed 2130 bilateral R>L infiltrates, weeks of  symptoms, Eos ~21k on CBC, 85% eos on BAL. Much improved symptomatically on prednisone 40 mg daily (0.5 mg/kg). Symptoms recurred on 10 mg of prednisone during initial taper.  Chest x-ray with worsened interstitial markings.  He was placed on a slower taper at 20 mg.  He had improved symptoms.  Exam improved.  Weaned off steroids.  Eventual remission with Nucala.  Subsequent discontinued.  Recent urgent care visit 07/2022 told had pneumonia, recurrent symptoms despite 2 courses of antibiotics.  Suspicious for recurrence of chronic eosinophilic pneumonia.  Chest x-ray with mild interstitial bronchitic changes, elevated eosinophils 2.3K at last visit.  Recommended resumption of Nucala as below which she declines.  Symptoms overall much better.  Eosinophilic asthma: preceded diagnosis as above.  With decompensation over the last couple months.  Prolonged steroid taper.  Symptoms improved.  Again encouraged him to use Nucala.  He declines this today.  Nasal allergies/polyposis: Again discussed role and rationale for Nucala.  Continue sinus rinses twice daily, Flonase twice daily (prescription sent today), antihistamine.  Try Afrin twice daily for 3 days then stop.   Return in about 4 months (around 05/09/2023) for f/u Dr. Judeth Horn.   Karren Burly, MD 01/06/2023

## 2023-04-16 ENCOUNTER — Encounter (HOSPITAL_COMMUNITY): Payer: Self-pay | Admitting: Emergency Medicine

## 2023-04-16 ENCOUNTER — Emergency Department (HOSPITAL_COMMUNITY): Payer: Commercial Managed Care - PPO

## 2023-04-16 ENCOUNTER — Emergency Department (HOSPITAL_COMMUNITY)
Admission: EM | Admit: 2023-04-16 | Discharge: 2023-04-16 | Disposition: A | Discharge status: Home / Self Care | Payer: Commercial Managed Care - PPO | Attending: Emergency Medicine | Admitting: Emergency Medicine

## 2023-04-16 ENCOUNTER — Other Ambulatory Visit: Payer: Self-pay

## 2023-04-16 ENCOUNTER — Telehealth: Payer: Self-pay | Admitting: Pulmonary Disease

## 2023-04-16 DIAGNOSIS — K2900 Acute gastritis without bleeding: Secondary | ICD-10-CM | POA: Diagnosis not present

## 2023-04-16 DIAGNOSIS — J8283 Eosinophilic asthma: Secondary | ICD-10-CM | POA: Diagnosis not present

## 2023-04-16 DIAGNOSIS — K219 Gastro-esophageal reflux disease without esophagitis: Secondary | ICD-10-CM | POA: Diagnosis not present

## 2023-04-16 DIAGNOSIS — D72829 Elevated white blood cell count, unspecified: Secondary | ICD-10-CM | POA: Diagnosis not present

## 2023-04-16 DIAGNOSIS — Z7951 Long term (current) use of inhaled steroids: Secondary | ICD-10-CM | POA: Diagnosis not present

## 2023-04-16 DIAGNOSIS — R1013 Epigastric pain: Secondary | ICD-10-CM | POA: Diagnosis present

## 2023-04-16 LAB — COMPREHENSIVE METABOLIC PANEL
ALT: 18 U/L (ref 0–44)
AST: 13 U/L — ABNORMAL LOW (ref 15–41)
Albumin: 3.5 g/dL (ref 3.5–5.0)
Alkaline Phosphatase: 90 U/L (ref 38–126)
Anion gap: 9 (ref 5–15)
BUN: 9 mg/dL (ref 6–20)
CO2: 22 mmol/L (ref 22–32)
Calcium: 8.4 mg/dL — ABNORMAL LOW (ref 8.9–10.3)
Chloride: 106 mmol/L (ref 98–111)
Creatinine, Ser: 0.93 mg/dL (ref 0.61–1.24)
GFR, Estimated: >60 mL/min (ref >60)
Glucose, Bld: 97 mg/dL (ref 70–99)
Potassium: 3.9 mmol/L (ref 3.5–5.1)
Sodium: 137 mmol/L (ref 135–145)
Total Bilirubin: 1.6 mg/dL — ABNORMAL HIGH (ref 0.3–1.2)
Total Protein: 6.7 g/dL (ref 6.5–8.1)

## 2023-04-16 LAB — CBC
HCT: 48.8 % (ref 39.0–52.0)
Hemoglobin: 16.7 g/dL (ref 13.0–17.0)
MCH: 31.2 pg (ref 26.0–34.0)
MCHC: 34.2 g/dL (ref 30.0–36.0)
MCV: 91.2 fL (ref 80.0–100.0)
Platelets: 356 10*3/uL (ref 150–400)
RBC: 5.35 MIL/uL (ref 4.22–5.81)
RDW: 12.4 % (ref 11.5–15.5)
WBC: 17.4 10*3/uL — ABNORMAL HIGH (ref 4.0–10.5)
nRBC: 0 % (ref 0.0–0.2)

## 2023-04-16 LAB — LIPASE, BLOOD: Lipase: 30 U/L (ref 11–51)

## 2023-04-16 MED ORDER — ONDANSETRON HCL 4 MG/2ML IJ SOLN
4.0000 mg | Freq: Once | INTRAMUSCULAR | Status: AC
  Administered 2023-04-16: 4 mg via INTRAVENOUS
  Filled 2023-04-16: qty 2

## 2023-04-16 MED ORDER — IPRATROPIUM-ALBUTEROL 0.5-2.5 (3) MG/3ML IN SOLN
3.0000 mL | Freq: Once | RESPIRATORY_TRACT | Status: AC
  Administered 2023-04-16: 3 mL via RESPIRATORY_TRACT
  Filled 2023-04-16: qty 3

## 2023-04-16 MED ORDER — LIDOCAINE VISCOUS HCL 2 % MT SOLN
15.0000 mL | Freq: Once | OROMUCOSAL | Status: AC
  Administered 2023-04-16: 15 mL via ORAL
  Filled 2023-04-16: qty 15

## 2023-04-16 MED ORDER — PANTOPRAZOLE SODIUM 40 MG IV SOLR
40.0000 mg | Freq: Once | INTRAVENOUS | Status: AC
  Administered 2023-04-16: 40 mg via INTRAVENOUS
  Filled 2023-04-16: qty 10

## 2023-04-16 MED ORDER — SUCRALFATE 1 G PO TABS: 1.0000 g | ORAL_TABLET | Freq: Three times a day (TID) | ORAL | 2 refills | Status: DC

## 2023-04-16 MED ORDER — PANTOPRAZOLE SODIUM 40 MG PO TBEC: 40.0000 mg | DELAYED_RELEASE_TABLET | Freq: Every day | ORAL | 2 refills | Status: DC

## 2023-04-16 MED ORDER — ALUM & MAG HYDROXIDE-SIMETH 200-200-20 MG/5ML PO SUSP
30.0000 mL | Freq: Once | ORAL | Status: AC
  Administered 2023-04-16: 30 mL via ORAL
  Filled 2023-04-16: qty 30

## 2023-04-16 NOTE — ED Triage Notes (Signed)
  Patient comes in with epigastric pain and reflux that woke him up from sleep around 0300 this morning.  Patient states he has a hx of GERD and ate "really spicy pizza" last night.  Patient states he took an antacid before bed.  Endorses nausea but no emesis.  Pain 8/10, burning.

## 2023-04-16 NOTE — Telephone Encounter (Signed)
Lm x1 for patient.  

## 2023-04-16 NOTE — ED Provider Notes (Signed)
[place] EMERGENCY DEPARTMENT AT [facility] Provider Note   CSN: [id number] Arrival date & time: 04/16/23  7829     History  Chief Complaint  Patient presents with   Gastroesophageal Reflux    Tracy Pugh is a 42 y.o. male past medical history significant for asthma, eosinophilic asthma/pneumonia, GERD who presents concern for epigastric pain, reflux that woke him up from sleep around 3 AM this morning.  He denies any anterior chest pain, chest pain it feels like pressure.  He denies any history of cardiac disease, hypertension, hyperlipidemia.  He does report that he had some "really spicy pizza" yesterday.  He reports taking an antacid before bed but did not give him any relief.  He endorses some nausea but no vomiting.   Gastroesophageal Reflux       Home Medications Prior to Admission medications   Medication Sig Start Date End Date Taking? Authorizing Provider  pantoprazole (PROTONIX) 40 MG tablet Take 1 tablet (40 mg total) by mouth daily. 04/16/23  Yes Maalik Pinn H, PA-C  sucralfate (CARAFATE) 1 g tablet Take 1 tablet (1 g total) by mouth 4 (four) times daily -  with meals and at bedtime. As needed 04/16/23  Yes Jerelle Virden H, PA-C  albuterol (VENTOLIN HFA) 108 (90 Base) MCG/ACT inhaler INHALE 2 PUFFS INTO THE LUNGS EVERY 6 (SIX) HOURS AS NEEDED FOR WHEEZING OR SHORTNESS OF BREATH. 04/21/22 04/21/23  Hunsucker, Lesia Sago, MD  BREO ELLIPTA 200-25 MCG/ACT AEPB INHALE 1 PUFF DAILY INTO LUNGS 12/09/22   Hunsucker, Lesia Sago, MD  cetirizine (ZYRTEC) 10 MG tablet Take 10 mg by mouth daily.    [provider]  EPINEPHrine 0.3 mg/0.3 mL IJ SOAJ injection Inject 0.3 mg into the muscle as needed for anaphylaxis. 03/20/21   Hunsucker, Lesia Sago, MD  fluticasone [facility] ALLERGY RELIEF) 50 MCG/ACT nasal spray Place 1 spray into both nostrils 2 (two) times daily. 01/06/23   Hunsucker, Lesia Sago, MD  sodium chloride (OCEAN) 0.65 % SOLN nasal spray  Place 1 spray into both nostrils as needed for congestion. 03/13/20   Couture, Cortni S, PA-C      Allergies    Patient has no known allergies.    Review of Systems   Review of Systems  All other systems reviewed and are negative.   Physical Exam Updated Vital Signs BP 124/86   Pulse 89   Temp 98.1 F (36.7 C)   Resp 16   Ht 5\' 6"  (1.676 m)   Wt 79.8 kg   SpO2 93%   BMI 28.41 kg/m  Physical Exam Vitals and nursing note reviewed.  Constitutional:      General: He is not in acute distress.    Appearance: Normal appearance.  HENT:     Head: Normocephalic and atraumatic.  Eyes:     General:        Right eye: No discharge.        Left eye: No discharge.  Cardiovascular:     Rate and Rhythm: Normal rate and regular rhythm.     Heart sounds: No murmur heard.    No friction rub. No gallop.  Pulmonary:     Effort: Pulmonary effort is normal.     Breath sounds: Normal breath sounds.     Comments: Patient does have some wheezing, left greater than right.  No focal consolidation auscultated.  Normal respiratory effort throughout. Abdominal:     General: Bowel sounds are normal.     Palpations: Abdomen  is soft.     Comments: Moderate ttp in epigastric region, no rebound, rigidity, guarding  Skin:    General: Skin is warm and dry.     Capillary Refill: Capillary refill takes less than 2 seconds.  Neurological:     Mental Status: He is alert and oriented to person, place, and time.  Psychiatric:        Mood and Affect: Mood normal.        Behavior: Behavior normal.     ED Results / Procedures / Treatments   Labs (all labs ordered are listed, but only abnormal results are displayed) Labs Reviewed  CBC - Abnormal; Notable for the following components:      Result Value   WBC 17.4 (*)    All other components within normal limits  COMPREHENSIVE METABOLIC PANEL - Abnormal; Notable for the following components:   Calcium 8.4 (*)    AST 13 (*)    Total Bilirubin 1.6 (*)     All other components within normal limits  LIPASE, BLOOD  URINALYSIS, ROUTINE W REFLEX MICROSCOPIC    EKG None  Radiology DG Chest Portable 1 View  Result Date: 04/16/2023 CLINICAL DATA:  Shortness of breath. EXAM: PORTABLE CHEST 1 VIEW COMPARISON:  Chest radiograph 11/06/2022. FINDINGS: Clear lungs. Normal heart size and mediastinal contours. No pleural effusion or pneumothorax. Visualized bones and upper abdomen are unremarkable. IMPRESSION: No evidence of acute cardiopulmonary disease. Electronically Signed   By: Orvan Falconer M.D.   On: 04/16/2023 08:16    Procedures Procedures    Medications Ordered in ED Medications  alum & mag hydroxide-simeth (MAALOX/MYLANTA) 200-200-20 MG/5ML suspension 30 mL (30 mLs Oral Given 04/16/23 0741)    And  lidocaine (XYLOCAINE) 2 % viscous mouth solution 15 mL (15 mLs Oral Given 04/16/23 0741)  pantoprazole (PROTONIX) injection 40 mg (40 mg Intravenous Given 04/16/23 0752)  ipratropium-albuterol (DUONEB) 0.5-2.5 (3) MG/3ML nebulizer solution 3 mL (3 mLs Nebulization Given 04/16/23 0756)  ondansetron (ZOFRAN) injection 4 mg (4 mg Intravenous Given 04/16/23 0749)    ED Course/ Medical Decision Making/ A&P                                 Medical Decision Making  This patient is a 42 y.o. male  who presents to the ED for concern of epigastric pain, as well as some wheezing.   Differential diagnoses prior to evaluation: The emergent differential diagnosis includes, but is not limited to, acute on chronic exacerbation of eosinophilic pneumonia/asthma, acid reflux, vs esophagitis, gastritis, peptic ulcer disease, esophageal rupture, gastric rupture, Boerhaave's, Mallory-Weiss, pancreatitis, cholecystitis, cholangitis, acute mesenteric ischemia, atypical chest pain or ACS, lower lobar pneumonia versus other. This is not an exhaustive differential.   Past Medical History / Co-morbidities / Social History:  asthma, eosinophilic asthma/pneumonia,  GERD  Additional history: Chart reviewed. Pertinent results include: reviewed outpatient pulmonology visits  Physical Exam: Physical exam performed. The pertinent findings include: Patient does have some wheezing, left greater than right.  No focal consolidation auscultated.  Normal respiratory effort throughout.  Lab Tests/Imaging studies: I personally interpreted labs/imaging and the pertinent results include: CMP notable for mildly elevated total bilirubin at 1.6, think likely secondary to dehydration.  His CBC shows some signs of hemoconcentration with elevated hemoglobin, and white blood cells, white blood cell elevation also likely secondary to his chronic steroid use, he recently just completed a course of steroids.  Normal lipase.Marland Kitchen  Independently interpreted plain film chest x-ray which shows no evidence of acute intrathoracic abnormality.  I agree with the radiologist interpretation.  Cardiac monitoring: EKG obtained and interpreted by myself and attending physician which shows: Normal sinus rhythm, no ST-T changes   Medications: I ordered medication including DuoNeb for wheezing, Protonix, Zofran, and GI cocktail for epigastric pain, suspicious of GERD.  I have reviewed the patients home medicines and have made adjustments as needed.   Disposition: After consideration of the diagnostic results and the patients response to treatment, I feel that patient feeling better on reevaluation, stable for discharge at this time.   emergency department workup does not suggest an emergent condition requiring admission or immediate intervention beyond what has been performed at this time. The plan is: as above. The patient is safe for discharge and has been instructed to return immediately for worsening symptoms, change in symptoms or any other concerns.  Final Clinical Impression(s) / ED Diagnoses Final diagnoses:  Gastroesophageal reflux disease, unspecified whether esophagitis present  Acute  gastritis without hemorrhage, unspecified gastritis type  Eosinophilic asthma    Rx / DC Orders ED Discharge Orders          Ordered    pantoprazole (PROTONIX) 40 MG tablet  Daily        04/16/23 1032    sucralfate (CARAFATE) 1 g tablet  3 times daily with meals & bedtime        04/16/23 1032              Olene Floss, PA-C 04/16/23 1032    Linwood Dibbles, MD 04/16/23 1827

## 2023-04-16 NOTE — Telephone Encounter (Signed)
PT would like to restart Nucala if Dr. Posey Rea it. Please call PT @ [phone]  [facility] [place]

## 2023-04-16 NOTE — Telephone Encounter (Signed)
Patient tried to return call.

## 2023-04-16 NOTE — Discharge Instructions (Addendum)
Please follow-up with the GI doctor whose contact information I provided above, if you have significant worsening of your abdominal pain I recommend that you return to the emergency department for further evaluation management.

## 2023-04-18 ENCOUNTER — Encounter (HOSPITAL_COMMUNITY): Payer: Self-pay

## 2023-04-18 ENCOUNTER — Emergency Department (HOSPITAL_COMMUNITY)
Admission: EM | Admit: 2023-04-18 | Discharge: 2023-04-19 | Disposition: A | Discharge status: Home / Self Care | Payer: Commercial Managed Care - PPO | Attending: Student | Admitting: Student

## 2023-04-18 DIAGNOSIS — J4530 Mild persistent asthma, uncomplicated: Secondary | ICD-10-CM | POA: Insufficient documentation

## 2023-04-18 DIAGNOSIS — R1013 Epigastric pain: Secondary | ICD-10-CM | POA: Diagnosis present

## 2023-04-18 DIAGNOSIS — D72829 Elevated white blood cell count, unspecified: Secondary | ICD-10-CM | POA: Diagnosis not present

## 2023-04-18 DIAGNOSIS — R1011 Right upper quadrant pain: Secondary | ICD-10-CM | POA: Diagnosis not present

## 2023-04-18 DIAGNOSIS — J189 Pneumonia, unspecified organism: Secondary | ICD-10-CM

## 2023-04-18 DIAGNOSIS — Z87891 Personal history of nicotine dependence: Secondary | ICD-10-CM | POA: Diagnosis not present

## 2023-04-18 DIAGNOSIS — D721 Eosinophilia, unspecified: Secondary | ICD-10-CM

## 2023-04-18 DIAGNOSIS — K2900 Acute gastritis without bleeding: Secondary | ICD-10-CM

## 2023-04-18 DIAGNOSIS — Z7951 Long term (current) use of inhaled steroids: Secondary | ICD-10-CM | POA: Insufficient documentation

## 2023-04-18 LAB — CBC
HCT: 48.6 % (ref 39.0–52.0)
Hemoglobin: 17.3 g/dL — ABNORMAL HIGH (ref 13.0–17.0)
MCH: 31.6 pg (ref 26.0–34.0)
MCHC: 35.6 g/dL (ref 30.0–36.0)
MCV: 88.8 fL (ref 80.0–100.0)
Platelets: 412 10*3/uL — ABNORMAL HIGH (ref 150–400)
RBC: 5.47 MIL/uL (ref 4.22–5.81)
RDW: 12 % (ref 11.5–15.5)
WBC: 18.7 10*3/uL — ABNORMAL HIGH (ref 4.0–10.5)
nRBC: 0 % (ref 0.0–0.2)

## 2023-04-18 LAB — COMPREHENSIVE METABOLIC PANEL
ALT: 16 U/L (ref 0–44)
AST: 13 U/L — ABNORMAL LOW (ref 15–41)
Albumin: 3.8 g/dL (ref 3.5–5.0)
Alkaline Phosphatase: 102 U/L (ref 38–126)
Anion gap: 9 (ref 5–15)
BUN: 9 mg/dL (ref 6–20)
CO2: 24 mmol/L (ref 22–32)
Calcium: 8.9 mg/dL (ref 8.9–10.3)
Chloride: 102 mmol/L (ref 98–111)
Creatinine, Ser: 1.18 mg/dL (ref 0.61–1.24)
GFR, Estimated: >60 mL/min (ref >60)
Glucose, Bld: 105 mg/dL — ABNORMAL HIGH (ref 70–99)
Potassium: 3.8 mmol/L (ref 3.5–5.1)
Sodium: 135 mmol/L (ref 135–145)
Total Bilirubin: 1.2 mg/dL (ref 0.3–1.2)
Total Protein: 7.5 g/dL (ref 6.5–8.1)

## 2023-04-18 LAB — LIPASE, BLOOD: Lipase: 30 U/L (ref 11–51)

## 2023-04-18 NOTE — ED Triage Notes (Signed)
Pt arrived POV d/t upper ABD pain with N/V onset today but was seen here for same 4 days ago for possible Gallbladder issues.

## 2023-04-19 ENCOUNTER — Emergency Department (HOSPITAL_COMMUNITY): Payer: Commercial Managed Care - PPO

## 2023-04-19 LAB — URINALYSIS, ROUTINE W REFLEX MICROSCOPIC
Bilirubin Urine: NEGATIVE
Glucose, UA: NEGATIVE mg/dL
Hgb urine dipstick: NEGATIVE
Ketones, ur: 20 mg/dL — AB
Leukocytes,Ua: NEGATIVE
Nitrite: NEGATIVE
Protein, ur: NEGATIVE mg/dL
Specific Gravity, Urine: 1.019 (ref 1.005–1.030)
pH: 5 (ref 5.0–8.0)

## 2023-04-19 LAB — DIFFERENTIAL
Abs Immature Granulocytes: 0.09 10*3/uL — ABNORMAL HIGH (ref 0.00–0.07)
Basophils Absolute: 0.1 10*3/uL (ref 0.0–0.1)
Basophils Relative: 1 %
Eosinophils Absolute: 3.8 10*3/uL — ABNORMAL HIGH (ref 0.0–0.5)
Eosinophils Relative: 20 %
Immature Granulocytes: 1 %
Lymphocytes Relative: 10 %
Lymphs Abs: 1.9 10*3/uL (ref 0.7–4.0)
Monocytes Absolute: 1.3 10*3/uL — ABNORMAL HIGH (ref 0.1–1.0)
Monocytes Relative: 7 %
Neutro Abs: 11.5 10*3/uL — ABNORMAL HIGH (ref 1.7–7.7)
Neutrophils Relative %: 61 %

## 2023-04-19 MED ORDER — SODIUM CHLORIDE 0.9 % IV SOLN
2.0000 g | Freq: Once | INTRAVENOUS | Status: AC
  Administered 2023-04-19: 2 g via INTRAVENOUS
  Filled 2023-04-19: qty 20

## 2023-04-19 MED ORDER — SODIUM CHLORIDE 0.9 % IV SOLN
500.0000 mg | Freq: Once | INTRAVENOUS | Status: AC
  Administered 2023-04-19: 500 mg via INTRAVENOUS
  Filled 2023-04-19: qty 5

## 2023-04-19 MED ORDER — IOHEXOL 300 MG/ML  SOLN
100.0000 mL | Freq: Once | INTRAMUSCULAR | Status: AC | PRN
  Administered 2023-04-19: 100 mL via INTRAVENOUS

## 2023-04-19 MED ORDER — AMOXICILLIN-POT CLAVULANATE 875-125 MG PO TABS: 1.0000 | ORAL_TABLET | Freq: Once | ORAL | Status: DC

## 2023-04-19 MED ORDER — FAMOTIDINE IN NACL 20-0.9 MG/50ML-% IV SOLN
20.0000 mg | Freq: Once | INTRAVENOUS | Status: AC
Start: 2023-04-19 — End: 2023-04-19
  Administered 2023-04-19: 20 mg via INTRAVENOUS
  Filled 2023-04-19: qty 50

## 2023-04-19 MED ORDER — CEFPODOXIME PROXETIL 100 MG PO TABS: 100.0000 mg | ORAL_TABLET | Freq: Two times a day (BID) | ORAL | 0 refills | Status: DC

## 2023-04-19 MED ORDER — FAMOTIDINE 20 MG PO TABS: 20.0000 mg | ORAL_TABLET | Freq: Two times a day (BID) | ORAL | 0 refills | Status: DC

## 2023-04-19 MED ORDER — LACTATED RINGERS IV BOLUS
1000.0000 mL | Freq: Once | INTRAVENOUS | Status: AC
Start: 2023-04-19 — End: 2023-04-19
  Administered 2023-04-19: 1000 mL via INTRAVENOUS

## 2023-04-19 NOTE — ED Provider Notes (Signed)
[place] EMERGENCY DEPARTMENT AT [facility] Provider Note  CSN: [id number] Arrival date & time: 04/18/23 2203  Chief Complaint(s) Abdominal Pain  HPI Tracy Pugh is a 42 y.o. male with PMH asthma, GERD who presents emergency room for evaluation of epigastric pain and right quadrant pain.  Was seen on 04/16/2023 for similar complaints and discharged on pantoprazole and Carafate.  He had an allergic reaction to 1 of these medications at home with hives over the trunk and hands and he has since discontinued both these medicines.  His symptoms have returned and worsened this evening and returns Emergency Department for further evaluation.  Denies chest pain, shortness of breath, headache, fever or other systemic symptoms.   Past Medical History Past Medical History:  Diagnosis Date   Asthma    Patient Active Problem List   Diagnosis Date Noted   Acute hypoxemic respiratory failure (HCC) 09/11/2020   Multifocal pneumonia 09/11/2020   Sepsis (HCC) 09/11/2020   Asthma exacerbation 09/11/2020   Hypokalemia 09/11/2020   Mild persistent asthma without complication 02/28/2020   Other allergic rhinitis 02/28/2020   Adverse food reaction 02/28/2020   Allergic conjunctivitis of both eyes 02/28/2020   Home Medication(s) Prior to Admission medications   Medication Sig Start Date End Date Taking? Authorizing Provider  albuterol (VENTOLIN HFA) 108 (90 Base) MCG/ACT inhaler INHALE 2 PUFFS INTO THE LUNGS EVERY 6 (SIX) HOURS AS NEEDED FOR WHEEZING OR SHORTNESS OF BREATH. 04/21/22 04/21/23 Yes Hunsucker, Lesia Sago, MD  BREO ELLIPTA 200-25 MCG/ACT AEPB INHALE 1 PUFF DAILY INTO LUNGS 12/09/22  Yes Hunsucker, Lesia Sago, MD  cetirizine (ZYRTEC) 10 MG tablet Take 10 mg by mouth daily.   Yes [provider]  EPINEPHrine 0.3 mg/0.3 mL IJ SOAJ injection Inject 0.3 mg into the muscle as needed for anaphylaxis. 03/20/21  Yes Hunsucker, Lesia Sago, MD  fluticasone [facility] ALLERGY RELIEF)  50 MCG/ACT nasal spray Place 1 spray into both nostrils 2 (two) times daily. 01/06/23  Yes Hunsucker, Lesia Sago, MD  sodium chloride (OCEAN) 0.65 % SOLN nasal spray Place 1 spray into both nostrils as needed for congestion. 03/13/20  Yes Couture, Cortni S, PA-C  pantoprazole (PROTONIX) 40 MG tablet Take 1 tablet (40 mg total) by mouth daily. Patient not taking: Reported on 04/18/2023 04/16/23   Prosperi, Christian H, PA-C  sucralfate (CARAFATE) 1 g tablet Take 1 tablet (1 g total) by mouth 4 (four) times daily -  with meals and at bedtime. As needed Patient not taking: Reported on 04/18/2023 04/16/23   [place]                                                                                                                                    Past Surgical History Past Surgical History:  Procedure Laterality Date   sinus polyp surgery     VIDEO BRONCHOSCOPY N/A 10/12/2020   Procedure: VIDEO BRONCHOSCOPY WITHOUT FLUORO WITH  BRONCHOALVEOLAR LAVAGE;  Surgeon: Steffanie Dunn, DO;  Location: [facility] ENDOSCOPY;  Service: Endoscopy;  Laterality: N/A;   Family History Family History  Problem Relation Age of Onset   Rheum arthritis Mother     Social History Social History   Tobacco Use   Smoking status: Former    Current packs/day: 0.00    Types: Cigarettes    Start date: 2002    Quit date: 2019    Years since quitting: 5.8   Smokeless tobacco: Never  Vaping Use   Vaping status: Never Used  Substance Use Topics   Alcohol use: Yes   Drug use: Never   Allergies Patient has no known allergies.  Review of Systems Review of Systems  Gastrointestinal:  Positive for abdominal pain and nausea.    Physical Exam Vital Signs  I have reviewed the triage vital signs BP (!) 132/91 (BP Location: Right Arm)   Pulse (!) 120   Temp 99.4 F (37.4 C) (Oral)   Resp 18   Ht 5\' 6"  (1.676 m)   Wt 81.6 kg   SpO2 92%   BMI 29.05 kg/m   Physical Exam Constitutional:      General: He  is not in acute distress.    Appearance: Normal appearance.  HENT:     Head: Normocephalic and atraumatic.     Nose: No congestion or rhinorrhea.  Eyes:     General:        Right eye: No discharge.        Left eye: No discharge.     Extraocular Movements: Extraocular movements intact.     Pupils: Pupils are equal, round, and reactive to light.  Cardiovascular:     Rate and Rhythm: Normal rate and regular rhythm.     Heart sounds: No murmur heard. Pulmonary:     Effort: No respiratory distress.     Breath sounds: No wheezing or rales.  Abdominal:     General: There is no distension.     Tenderness: There is abdominal tenderness in the right upper quadrant and epigastric area.  Musculoskeletal:        General: Normal range of motion.     Cervical back: Normal range of motion.  Skin:    General: Skin is warm and dry.  Neurological:     General: No focal deficit present.     Mental Status: He is alert.    ED Results and Treatments Labs (all labs ordered are listed, but only abnormal results are displayed) Labs Reviewed  COMPREHENSIVE METABOLIC PANEL - Abnormal; Notable for the following components:      Result Value   Glucose, Bld 105 (*)    AST 13 (*)    All other components within normal limits  CBC - Abnormal; Notable for the following components:   WBC 18.7 (*)    Hemoglobin 17.3 (*)    Platelets 412 (*)    All other components within normal limits  LIPASE, BLOOD  URINALYSIS, ROUTINE W REFLEX MICROSCOPIC  Radiology No results found.  Pertinent labs & imaging results that were available during my care of the patient were reviewed by me and considered in my medical decision making (see MDM for details).  Medications Ordered in ED Medications  lactated ringers bolus 1,000 mL (has no administration in time range)  famotidine (PEPCID) IVPB 20 mg  premix (has no administration in time range)                                                                                                                                     Procedures Procedures  (including critical care time)  Medical Decision Making / ED Course   This patient presents to the ED for concern of abdominal pain, this involves an extensive number of treatment options, and is a complaint that carries with it a high risk of complications and morbidity.  The differential diagnosis includes GERD/gastritis, peptic ulcer disease, pancreatitis, gastroparesis, pneumonia, pleurisy, pericarditis  MDM: Patient seen emergency room for evaluation of epigastric abdominal pain.  Physical exam with faint rales at the bases, tenderness in the epigastrium but is otherwise unremarkable.  Laboratory evaluation with a leukocytosis to 18.7 with significant eosinophilia, hemoglobin 17.3, lipase normal, urinalysis unremarkable.  CT abdomen pelvis showing bronchopneumonia but is otherwise unremarkable.  No evidence of gallbladder pathology.  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 Jamison Neighbor to discuss the utility of antibiotics and he is recommending dedicated CT chest.  Dedicated CT chest showing 4.9 cm consolidative area in the left lower lobe.  Patient given single dose ceftriaxone azithromycin and will be discharged on Vantin.  He was given Pepcid for abdominal pain and on reevaluation his symptoms have resolved.  Suspect his abdominal pain is secondary to gastritis from his frequent steroid use.  Outpatient gastroenterology referral placed.  I sent a message to the patient's primary pulmonologist Dr. Judeth Horn to expedite outpatient follow-up.  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  He did have an evaluation by hematologist in 2022 who was skeptical because the patient did not have any signs or symptoms of vasculitis.  However patient does arrive with patchy violaceous lesions over the trunk and hands today which are concerning for vasculitis.  Thus, I placed an outpatient referral to rheumatology as I do believe they may be able to assist here.  Patient currently does not meet inpatient criteria for admission and is safe for discharge with outpatient follow-up.  Patient discharged   Additional history obtained: \ -External records from outside source obtained and reviewed including: Chart review including previous notes, labs, imaging, consultation notes   Lab Tests: -I ordered, reviewed, and interpreted labs.   The pertinent results include:   Labs  Reviewed  COMPREHENSIVE METABOLIC PANEL - Abnormal; Notable for the following components:      Result Value   Glucose, Bld 105 (*)    AST 13 (*)    All other components within normal limits  CBC - Abnormal; Notable for the following components:   WBC 18.7 (*)    Hemoglobin 17.3 (*)    Platelets 412 (*)    All other components within normal limits  LIPASE, BLOOD  URINALYSIS, ROUTINE W REFLEX MICROSCOPIC      EKG   EKG Interpretation Date/Time:  Saturday April 18 2023 22:24:55 EDT Ventricular Rate:  117 PR Interval:  116 QRS Duration:  80 QT Interval:  300 QTC Calculation: 419 R Axis:   49  Text Interpretation: Sinus tachycardia Probable left atrial enlargement Confirmed by Angeline Trick ([id number]) on 04/19/2023 12:04:36 AM         Imaging Studies ordered: I ordered imaging studies including CT AP, CT chest  I independently visualized and interpreted imaging. I agree with the radiologist interpretation   Medicines ordered and prescription drug management: Meds ordered this encounter  Medications   lactated ringers  bolus 1,000 mL   famotidine (PEPCID) IVPB 20 mg premix    -I have reviewed the patients home medicines and have made adjustments as needed  Critical interventions none  Consultations Obtained: I requested consultation with the pulmonologist on-call Dr. Jamison Neighbor,  and discussed lab and imaging findings as well as pertinent plan - they recommend: Dedicated CT chest, outpatient follow-up   Cardiac Monitoring: The patient was maintained on a cardiac monitor.  I personally viewed and interpreted the cardiac monitored which showed an underlying rhythm of: NSR  Social Determinants of Health:  Factors impacting patients care include: none   Reevaluation: After the interventions noted above, I reevaluated the patient and found that they have :improved  Co morbidities that complicate the patient evaluation  Past Medical History:  Diagnosis Date   Asthma       Dispostion: I considered admission for this patient, but at this time he does not meet inpatient criteria for admission and is safe for discharge with multiple outpatient follow-up providers.  Given return precautions of which he voiced understanding     Final Clinical Impression(s) / ED Diagnoses Final diagnoses:  None     @PCDICTATION @    Glendora Score, MD 04/19/23 [phone]

## 2023-04-19 NOTE — Discharge Instructions (Signed)
You are seen in the emergency room for evaluation of abdominal pain and cough.  Workup today is concerning for a bronchopneumonia which we will treat with antibiotics and likely some gastritis from your steroid use which is causing your abdominal pain.  I have placed outpatient referral to gastroenterology for your abdominal pain and please take Pepcid as needed for the stomach pain.  In addition, I do have some significant scarring that you may have an underlying autoimmune disease called Churg-Strauss.  Your previous history of extensive nasal polyps requiring surgery, persistent eosinophilic bronchopneumonia and elevated eosinophil counts would fit this disease.  I think you would benefit greatly from seeing a rheumatologist and I placed an outpatient referral for rheumatology.  Please call them for follow-up.  Return to the emergency department if you have persistent shortness of breath, inability to tolerate food and persistent vomiting or any other concerning symptoms.

## 2023-04-20 ENCOUNTER — Telehealth: Payer: Self-pay | Admitting: Pharmacist

## 2023-04-20 NOTE — Telephone Encounter (Signed)
Pt returning missed call. 

## 2023-04-20 NOTE — Telephone Encounter (Signed)
Plan is to restart patient on Nucala.  Submitted a Prior Authorization request to [facility] for NUCALA via CoverMyMeds. Will update once we receive a response.  Key: [id number]

## 2023-04-20 NOTE — Telephone Encounter (Signed)
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  He refused re-restarting nucala despite my urging at at last visit. And did not schedule a f/u. Will get him back on meds and see when he can come for clinic.  Can we get a f/u with me or APP? And can we start process for re-enrollment in nucala? Thanks! ----- Message ----- From: Glendora Score, MD Sent: 04/19/2023   4:36 AM EDT To: Karren Burly, MD Subject: ED follow up                                   Hey Dr. Judeth Horn,   I saw this patient Emergency Department for epigastric pain.  He bounced back from previous ER presentation with persistent abdominal pain.  Likely gastritis from his steroid use.  The reason I am reaching out is that he does have a consolidative pneumonia and I am going to place him on Vantin.  He has a history of chronic eosinophilic pneumonia and is having trouble getting into pulmonary clinic.  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  Any chance you can reach out to him to help set up outpatient follow-up to make sure this pneumonia is clearing?  Glendora Score MD

## 2023-04-22 ENCOUNTER — Other Ambulatory Visit (HOSPITAL_COMMUNITY): Payer: Self-pay

## 2023-04-22 ENCOUNTER — Telehealth: Payer: Self-pay | Admitting: Pulmonary Disease

## 2023-04-22 DIAGNOSIS — J180 Bronchopneumonia, unspecified organism: Secondary | ICD-10-CM

## 2023-04-22 MED ORDER — ALBUTEROL SULFATE (2.5 MG/3ML) 0.083% IN NEBU: 2.5000 mg | INHALATION_SOLUTION | Freq: Four times a day (QID) | RESPIRATORY_TRACT | 12 refills | Status: AC | PRN

## 2023-04-22 MED ORDER — LEVOFLOXACIN 500 MG PO TABS
500.0000 mg | ORAL_TABLET | Freq: Every day | ORAL | 0 refills | Status: DC
Start: 2023-04-22 — End: 2023-05-02

## 2023-04-22 NOTE — Telephone Encounter (Signed)
Stay off Vantin. Can send Augmentin [phone] 1 tab BID for 7 days.  Where are we with follow up visit and restarting Nucala?

## 2023-04-22 NOTE — Telephone Encounter (Signed)
Called and notified pt, nfn. Pt is scheduled for a OV. Neb order placed

## 2023-04-22 NOTE — Telephone Encounter (Signed)
Stay off medication. Take 25-50 mg of benadryl - can repeat every 6 hours. May cause drowsiness. If he has facial/tongue swelling, needs to go to the ED. He has a pneumonia so we need to switch him to something else. I don't see any other allergies listed. I'm going to send levaquin 500 mg once daily x 7 days to his pharmacy. If he develops heel or tendon/joint pain, needs to let us know. If he develops 3 or more watery BMs, needs to notify us. If he can't keep medicine down or symptoms worsen, go back to ED.   I also sent in albuterol nebs for him to use at least 3 times a day until symptoms improve. Please send urgent neb order. Continue Breo as prescribed. Take guaifenesin [phone] mg Twice daily. Thanks.

## 2023-04-22 NOTE — Telephone Encounter (Signed)
Pt was seen in the ED and states he is having a allergic reaction to Vantin. This was not prescribed by our office. Pt does not have a pcp, I advised pt to stay off the medication. Do you have any recommendations ? Please advise ( sending to [employer] )

## 2023-04-22 NOTE — Telephone Encounter (Signed)
Received notification from [facility] regarding a prior authorization for NUCALA. Authorization has been APPROVED from 04/21/23 to 10/20/23. Approval letter sent to scan center.  Unable to run test claim because patient must fill through [facility]: [phone]  Authorization # [id number]  Patient scheduled for Nucala restart appt on 04/27/2023 @ 3:20pm. Will use sample  Chesley Mires, PharmD, MPH, BCPS, CPP Clinical Pharmacist (Rheumatology and Pulmonology)

## 2023-04-22 NOTE — Telephone Encounter (Signed)
Spoke with Dr. Judeth Horn. Agrees with above plan. Thanks!

## 2023-04-22 NOTE — Telephone Encounter (Signed)
Patient states Vantin causing him to have hives. Pharmacy is [facility]. Patient phone number is [phone].

## 2023-04-27 ENCOUNTER — Other Ambulatory Visit: Payer: Self-pay

## 2023-04-27 ENCOUNTER — Inpatient Hospital Stay (HOSPITAL_BASED_OUTPATIENT_CLINIC_OR_DEPARTMENT_OTHER)
Admission: EM | Admit: 2023-04-27 | Discharge: 2023-05-02 | DRG: 197 | Disposition: A | Discharge status: Home / Self Care | Payer: Commercial Managed Care - PPO | Attending: Internal Medicine | Admitting: Internal Medicine

## 2023-04-27 ENCOUNTER — Emergency Department (HOSPITAL_BASED_OUTPATIENT_CLINIC_OR_DEPARTMENT_OTHER): Payer: Commercial Managed Care - PPO

## 2023-04-27 ENCOUNTER — Encounter (HOSPITAL_BASED_OUTPATIENT_CLINIC_OR_DEPARTMENT_OTHER): Payer: Self-pay | Admitting: Emergency Medicine

## 2023-04-27 ENCOUNTER — Ambulatory Visit: Payer: Commercial Managed Care - PPO | Admitting: Physician Assistant

## 2023-04-27 ENCOUNTER — Ambulatory Visit (INDEPENDENT_AMBULATORY_CARE_PROVIDER_SITE_OTHER): Payer: Commercial Managed Care - PPO | Admitting: Pharmacist

## 2023-04-27 DIAGNOSIS — Z8261 Family history of arthritis: Secondary | ICD-10-CM

## 2023-04-27 DIAGNOSIS — J4541 Moderate persistent asthma with (acute) exacerbation: Secondary | ICD-10-CM | POA: Diagnosis not present

## 2023-04-27 DIAGNOSIS — Z7951 Long term (current) use of inhaled steroids: Secondary | ICD-10-CM | POA: Diagnosis not present

## 2023-04-27 DIAGNOSIS — Z87891 Personal history of nicotine dependence: Secondary | ICD-10-CM | POA: Diagnosis not present

## 2023-04-27 DIAGNOSIS — Z888 Allergy status to other drugs, medicaments and biological substances status: Secondary | ICD-10-CM

## 2023-04-27 DIAGNOSIS — Z7189 Other specified counseling: Secondary | ICD-10-CM

## 2023-04-27 DIAGNOSIS — R17 Unspecified jaundice: Secondary | ICD-10-CM | POA: Diagnosis not present

## 2023-04-27 DIAGNOSIS — J45901 Unspecified asthma with (acute) exacerbation: Secondary | ICD-10-CM | POA: Diagnosis present

## 2023-04-27 DIAGNOSIS — J8281 Chronic eosinophilic pneumonia: Secondary | ICD-10-CM | POA: Diagnosis present

## 2023-04-27 DIAGNOSIS — J189 Pneumonia, unspecified organism: Principal | ICD-10-CM

## 2023-04-27 DIAGNOSIS — J4530 Mild persistent asthma, uncomplicated: Secondary | ICD-10-CM

## 2023-04-27 DIAGNOSIS — J8283 Eosinophilic asthma: Secondary | ICD-10-CM

## 2023-04-27 DIAGNOSIS — Z79899 Other long term (current) drug therapy: Secondary | ICD-10-CM | POA: Diagnosis not present

## 2023-04-27 DIAGNOSIS — E872 Acidosis, unspecified: Secondary | ICD-10-CM | POA: Diagnosis present

## 2023-04-27 LAB — CBC WITH DIFFERENTIAL/PLATELET
Abs Immature Granulocytes: 0.1 10*3/uL — ABNORMAL HIGH (ref 0.00–0.07)
Basophils Absolute: 0.2 10*3/uL — ABNORMAL HIGH (ref 0.0–0.1)
Basophils Relative: 1 %
Eosinophils Absolute: 21.7 10*3/uL — ABNORMAL HIGH (ref 0.0–0.5)
Eosinophils Relative: 61 %
HCT: 49.3 % (ref 39.0–52.0)
Hemoglobin: 17.8 g/dL — ABNORMAL HIGH (ref 13.0–17.0)
Immature Granulocytes: 0 %
Lymphocytes Relative: 10 %
Lymphs Abs: 3.4 10*3/uL (ref 0.7–4.0)
MCH: 31.3 pg (ref 26.0–34.0)
MCHC: 36.1 g/dL — ABNORMAL HIGH (ref 30.0–36.0)
MCV: 86.6 fL (ref 80.0–100.0)
Monocytes Absolute: 1.6 10*3/uL — ABNORMAL HIGH (ref 0.1–1.0)
Monocytes Relative: 4 %
Neutro Abs: 8.4 10*3/uL — ABNORMAL HIGH (ref 1.7–7.7)
Neutrophils Relative %: 24 %
Platelets: 600 10*3/uL — ABNORMAL HIGH (ref 150–400)
RBC: 5.69 MIL/uL (ref 4.22–5.81)
RDW: 11.9 % (ref 11.5–15.5)
WBC: 35.4 10*3/uL — ABNORMAL HIGH (ref 4.0–10.5)
nRBC: 0 % (ref 0.0–0.2)

## 2023-04-27 LAB — COMPREHENSIVE METABOLIC PANEL
ALT: 17 U/L (ref 0–44)
AST: 16 U/L (ref 15–41)
Albumin: 3.7 g/dL (ref 3.5–5.0)
Alkaline Phosphatase: 109 U/L (ref 38–126)
Anion gap: 14 (ref 5–15)
BUN: 10 mg/dL (ref 6–20)
CO2: 21 mmol/L — ABNORMAL LOW (ref 22–32)
Calcium: 8.7 mg/dL — ABNORMAL LOW (ref 8.9–10.3)
Chloride: 100 mmol/L (ref 98–111)
Creatinine, Ser: 1.19 mg/dL (ref 0.61–1.24)
GFR, Estimated: >60 mL/min (ref >60)
Glucose, Bld: 103 mg/dL — ABNORMAL HIGH (ref 70–99)
Potassium: 3.7 mmol/L (ref 3.5–5.1)
Sodium: 135 mmol/L (ref 135–145)
Total Bilirubin: 1.7 mg/dL — ABNORMAL HIGH (ref <1.2)
Total Protein: 7.8 g/dL (ref 6.5–8.1)

## 2023-04-27 LAB — LACTIC ACID, PLASMA: Lactic Acid, Venous: 1.2 mmol/L (ref 0.5–1.9)

## 2023-04-27 MED ORDER — SODIUM CHLORIDE 0.9 % IV SOLN
2.0000 g | INTRAVENOUS | Status: DC
  Administered 2023-04-28 – 2023-05-01 (×4): 2 g via INTRAVENOUS
  Filled 2023-04-27 (×4): qty 20

## 2023-04-27 MED ORDER — ENOXAPARIN SODIUM 40 MG/0.4ML IJ SOSY
40.0000 mg | PREFILLED_SYRINGE | INTRAMUSCULAR | Status: DC
  Filled 2023-04-27 (×4): qty 0.4

## 2023-04-27 MED ORDER — MELATONIN 5 MG PO TABS: 5.0000 mg | ORAL_TABLET | Freq: Every evening | ORAL | Status: DC | PRN

## 2023-04-27 MED ORDER — POLYETHYLENE GLYCOL 3350 17 G PO PACK: 17.0000 g | PACK | Freq: Every day | ORAL | Status: DC | PRN

## 2023-04-27 MED ORDER — LORATADINE 10 MG PO TABS
10.0000 mg | ORAL_TABLET | Freq: Every day | ORAL | Status: DC
  Administered 2023-04-28 – 2023-05-02 (×5): 10 mg via ORAL
  Filled 2023-04-27 (×5): qty 1

## 2023-04-27 MED ORDER — NUCALA 100 MG/ML ~~LOC~~ SOAJ: 100.0000 mg | SUBCUTANEOUS | 5 refills | Status: DC

## 2023-04-27 MED ORDER — FLUTICASONE FUROATE-VILANTEROL 200-25 MCG/ACT IN AEPB
1.0000 | INHALATION_SPRAY | Freq: Every day | RESPIRATORY_TRACT | Status: DC
Start: 2023-04-28 — End: 2023-04-29
  Administered 2023-04-28 – 2023-04-29 (×2): 1 via RESPIRATORY_TRACT
  Filled 2023-04-27: qty 28

## 2023-04-27 MED ORDER — PROCHLORPERAZINE EDISYLATE 10 MG/2ML IJ SOLN: 5.0000 mg | Freq: Four times a day (QID) | INTRAMUSCULAR | Status: DC | PRN

## 2023-04-27 MED ORDER — DEXTROSE 5 % IV SOLN
500.0000 mg | INTRAVENOUS | Status: DC
  Administered 2023-04-27 – 2023-05-01 (×5): 500 mg via INTRAVENOUS
  Filled 2023-04-27 (×5): qty 5

## 2023-04-27 MED ORDER — GUAIFENESIN 100 MG/5ML PO LIQD: 5.0000 mL | ORAL | Status: DC | PRN

## 2023-04-27 MED ORDER — SODIUM CHLORIDE 0.9 % IV SOLN
1.0000 g | Freq: Once | INTRAVENOUS | Status: AC
  Administered 2023-04-27: 1 g via INTRAVENOUS
  Filled 2023-04-27: qty 10

## 2023-04-27 MED ORDER — ALBUTEROL SULFATE (2.5 MG/3ML) 0.083% IN NEBU
2.5000 mg | INHALATION_SOLUTION | RESPIRATORY_TRACT | Status: DC | PRN
  Administered 2023-04-28: 2.5 mg via RESPIRATORY_TRACT
  Filled 2023-04-27: qty 3

## 2023-04-27 MED ORDER — DOXYCYCLINE HYCLATE 100 MG PO TABS
100.0000 mg | ORAL_TABLET | Freq: Once | ORAL | Status: AC
  Administered 2023-04-27: 100 mg via ORAL
  Filled 2023-04-27: qty 1

## 2023-04-27 MED ORDER — ALBUTEROL SULFATE HFA 108 (90 BASE) MCG/ACT IN AERS: 2.0000 | INHALATION_SPRAY | RESPIRATORY_TRACT | Status: DC | PRN

## 2023-04-27 MED ORDER — IPRATROPIUM-ALBUTEROL 0.5-2.5 (3) MG/3ML IN SOLN
3.0000 mL | Freq: Once | RESPIRATORY_TRACT | Status: AC
  Administered 2023-04-27: 3 mL via RESPIRATORY_TRACT
  Filled 2023-04-27: qty 3

## 2023-04-27 MED ORDER — METHYLPREDNISOLONE SODIUM SUCC 125 MG IJ SOLR
125.0000 mg | Freq: Once | INTRAMUSCULAR | Status: AC
  Administered 2023-04-27: 125 mg via INTRAVENOUS
  Filled 2023-04-27: qty 2

## 2023-04-27 MED ORDER — IPRATROPIUM-ALBUTEROL 0.5-2.5 (3) MG/3ML IN SOLN
3.0000 mL | Freq: Four times a day (QID) | RESPIRATORY_TRACT | Status: DC
  Administered 2023-04-27: 3 mL via RESPIRATORY_TRACT
  Filled 2023-04-27: qty 3

## 2023-04-27 MED ORDER — IPRATROPIUM-ALBUTEROL 0.5-2.5 (3) MG/3ML IN SOLN
3.0000 mL | RESPIRATORY_TRACT | Status: DC
  Filled 2023-04-27: qty 3

## 2023-04-27 MED ORDER — FAMOTIDINE 20 MG PO TABS
20.0000 mg | ORAL_TABLET | Freq: Two times a day (BID) | ORAL | Status: DC
  Administered 2023-04-27 – 2023-05-02 (×10): 20 mg via ORAL
  Filled 2023-04-27 (×10): qty 1

## 2023-04-27 MED ORDER — ACETAMINOPHEN 325 MG PO TABS: 650.0000 mg | ORAL_TABLET | Freq: Four times a day (QID) | ORAL | Status: DC | PRN

## 2023-04-27 MED ORDER — METHYLPREDNISOLONE SODIUM SUCC 40 MG IJ SOLR
40.0000 mg | Freq: Two times a day (BID) | INTRAMUSCULAR | Status: DC
  Administered 2023-04-27 – 2023-04-30 (×7): 40 mg via INTRAVENOUS
  Filled 2023-04-27 (×7): qty 1

## 2023-04-27 NOTE — H&P (Signed)
History and Physical  Tracy Pugh ZOX:[id number] DOB: 08-Jul-1980 DOA: 04/27/2023  Referring physician: Accepted by Dr. Kirke Corin [facility], Hospitalist service. PCP: Patient, No Pcp Per  Outpatient Specialists: Pulmonary. Patient coming from: Home through [facility] ED.  Chief Complaint: Persistent cough for the past week.  HPI: Tracy Pugh is a 42 y.o. 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, recently diagnosed with eosinophilic pneumonia in October 2024 who presents with symptoms of progressively worsening productive cough for more than a week, associated with audible wheezing, and pleuritic pain, worse with coughing.  Denies any fevers or chills.  He initially presented to his pulmonologist today.  Given his audible wheezing and increased work of breathing, the patient was advised to go to the ER for further evaluation and management.  In the ED, tachycardic and tachypneic with O2 saturation of 91% on room air.  Chest x-ray revealed multifocal pneumonia.  The patient was started on empiric IV antibiotics for community-acquired pneumonia.  The patient also received 1 dose of IV Solu-Medrol 125 x 1 for asthma exacerbation at [facility] ED.  [facility], hospitalist service, was asked to admit.  Accepted by Dr. Kirke Corin, [facility], and transferred to [facility] MedSurg unit as inpatient status.  At the time of this visit, the patient still has audible wheezing and a persistent cough.  Sputum culture, bronchodilators, and IV steroids, ordered.    ED Course: Temperature 98.5.  BP 123/79, pulse 96, respiration rate 17, saturation 92% on room air.  Lab studies notable for WBC 35.4, hemoglobin 17.8, platelet count 600.  Serum bicarb 21, glucose 103.  T. bili 1.7.  Lactic acid 1.2.  Review of Systems: Review of systems as  noted in the HPI. All other systems reviewed and are negative.   Past Medical History:  Diagnosis Date   Asthma    Past Surgical History:  Procedure Laterality Date   sinus polyp surgery     VIDEO BRONCHOSCOPY N/A 10/12/2020   Procedure: VIDEO BRONCHOSCOPY WITHOUT FLUORO WITH BRONCHOALVEOLAR LAVAGE;  Surgeon: Steffanie Dunn, DO;  Location: [facility] ENDOSCOPY;  Service: Endoscopy;  Laterality: N/A;    Social History:  reports that he quit smoking about 5 years ago. His smoking use included cigarettes. He started smoking about 22 years ago. He has never used smokeless tobacco. He reports current alcohol use. He reports that he does not use drugs.   No Known Allergies  Family History  Problem Relation Age of Onset   Rheum arthritis Mother       Prior to Admission medications   Medication Sig Start Date End Date Taking? Authorizing Provider  albuterol (PROVENTIL) (2.5 MG/3ML) 0.083% nebulizer solution Take 3 mLs (2.5 mg total) by nebulization every 6 (six) hours as needed for wheezing or shortness of breath. 04/22/23   Cobb, Ruby Cola, NP  albuterol (VENTOLIN HFA) 108 (90 Base) MCG/ACT inhaler INHALE 2 PUFFS INTO THE LUNGS EVERY 6 (SIX) HOURS AS NEEDED FOR WHEEZING OR SHORTNESS OF BREATH. 04/21/22 04/21/23  Hunsucker, Lesia Sago, MD  BREO ELLIPTA 200-25 MCG/ACT AEPB INHALE 1 PUFF DAILY INTO LUNGS 12/09/22   Hunsucker, Lesia Sago, MD  cetirizine (ZYRTEC) 10 MG tablet Take 10 mg by mouth daily.    [provider]  famotidine (PEPCID) 20 MG tablet Take 1 tablet (20 mg total) by mouth 2 (two) times daily. 04/19/23   Kommor, Sharon,  MD  fluticasone (FLONASE ALLERGY RELIEF) 50 MCG/ACT nasal spray Place 1 spray into both nostrils 2 (two) times daily. 01/06/23   Hunsucker, Lesia Sago, MD  levofloxacin (LEVAQUIN) 500 MG tablet Take 1 tablet (500 mg total) by mouth daily. 04/22/23   Cobb, Ruby Cola, NP  Mepolizumab (NUCALA) 100 MG/ML SOAJ Inject 1 mL (100 mg total) into the skin every 28  (twenty-eight) days. 04/27/23   Hunsucker, Lesia Sago, MD  pantoprazole (PROTONIX) 40 MG tablet Take 1 tablet (40 mg total) by mouth daily. Patient not taking: Reported on 04/18/2023 04/16/23   Prosperi, Christian H, PA-C  sodium chloride (OCEAN) 0.65 % SOLN nasal spray Place 1 spray into both nostrils as needed for congestion. 03/13/20   Couture, Cortni S, PA-C  sucralfate (CARAFATE) 1 g tablet Take 1 tablet (1 g total) by mouth 4 (four) times daily -  with meals and at bedtime. As needed Patient not taking: Reported on 04/18/2023 04/16/23   Prosperi, Harrel Carina, PA-C    Physical Exam: BP 123/79 (BP Location: Left Arm)   Pulse 96   Temp 98.5 F (36.9 C)   Resp 17   Ht 5\' 6"  (1.676 m)   Wt 81 kg   SpO2 92%   BMI 28.82 kg/m   General: 42 y.o. year-old male well developed well nourished in no acute distress.  Alert and oriented x3. Cardiovascular: Regular rate and rhythm with no rubs or gallops.  No thyromegaly or JVD noted.  No lower extremity edema. 2/4 pulses in all 4 extremities. Respiratory: Diffuse audible wheezing bilaterally.  Mild rales at bases. Good inspiratory effort. Abdomen: Soft nontender nondistended with normal bowel sounds x4 quadrants. Muskuloskeletal: No cyanosis, clubbing or edema noted bilaterally Neuro: CN II-XII intact, strength, sensation, reflexes Skin: No ulcerative lesions noted or rashes Psychiatry: Judgement and insight appear normal. Mood is appropriate for condition and setting          Labs on Admission:  Basic Metabolic Panel: Recent Labs  Lab 04/27/23 1646  NA 135  K 3.7  CL 100  CO2 21*  GLUCOSE 103*  BUN 10  CREATININE 1.19  CALCIUM 8.7*   Liver Function Tests: Recent Labs  Lab 04/27/23 1646  AST 16  ALT 17  ALKPHOS 109  BILITOT 1.7*  PROT 7.8  ALBUMIN 3.7   No results for input(s): "LIPASE", "AMYLASE" in the last 168 hours. No results for input(s): "AMMONIA" in the last 168 hours. CBC: Recent Labs  Lab 04/27/23 1646  WBC  35.4*  NEUTROABS 8.4*  HGB 17.8*  HCT 49.3  MCV 86.6  PLT 600*   Cardiac Enzymes: No results for input(s): "CKTOTAL", "CKMB", "CKMBINDEX", "TROPONINI" in the last 168 hours.  BNP (last 3 results) No results for input(s): "BNP" in the last 8760 hours.  ProBNP (last 3 results) No results for input(s): "PROBNP" in the last 8760 hours.  CBG: No results for input(s): "GLUCAP" in the last 168 hours.  Radiological Exams on Admission: DG Chest Portable 1 View  Result Date: 04/27/2023 CLINICAL DATA:  Shortness of breath, cough EXAM: PORTABLE CHEST 1 VIEW COMPARISON:  Radiograph 04/16/2023 and CT chest 04/19/2023 FINDINGS: Reticular and airspace opacities in the bilateral lower lungs are new since 04/16/2023. No pleural effusion or pneumothorax. Stable cardiomediastinal silhouette. No displaced rib fractures. IMPRESSION: Multifocal pneumonia, worsened compared with 04/19/2023. Follow-up to resolution is recommended Electronically Signed   By: Minerva Fester M.D.   On: 04/27/2023 19:02    EKG: I independently viewed the EKG  done and my findings are as followed: Sinus tachycardia rate of 115.  Nonspecific ST-T changes.  QTc 431.  Assessment/Plan Present on Admission:  Eosinophilic pneumonia (HCC)  Principal Problem:   Eosinophilic pneumonia (HCC)  Recurrent eosinophilic multifocal pneumonia, POA Personally reviewed chest x-ray done in the ED, revealing multifocal infiltrates suggestive of multifocal pneumonia Received IV Rocephin and p.o. doxycycline in the ED Continue IV Rocephin, add IV azithromycin Obtain sputum culture, follow ID and sensitivities and narrow down antibiotics. IV steroids Bronchodilators Antitussives Consider pulmonology consultation in the morning. Follows with Dr. Judeth Horn outpatient.  Asthma exacerbation in the setting of the above Continue IV steroids, bronchodilators Early mobilization Ambulatory Home O2 evaluation on 04/28/2023.  Mild anion gap  metabolic acidosis In the setting of an asthma exacerbation and pneumonia Continue to treat underlying conditions. Presented with serum bicarb of 21 and an anion gap of 14 Lactic acid 1.2 Continue current management  Isolated elevated total bilirubin Nonspecific T. bili 1.7 Monitor for now   Time: 75 minutes   DVT prophylaxis: Subcu Lovenox daily  Code Status: Full code  Family Communication: None at bedside.  Disposition Plan: Admitted to MedSurg unit, accepted by Dr. Kirke Corin, [facility], Hospitalist service.  Consults called: None.  Admission status: Inpatient status.   Status is: Inpatient The patient requires at least 2 midnights for further evaluation and treatment of present condition.   Darlin Drop MD [facility] Pager [phone]  If 7PM-7AM, please contact night-coverage [email or web address] Password [email or web address]  04/27/2023, 9:10 PM

## 2023-04-27 NOTE — ED Provider Notes (Signed)
[place] EMERGENCY DEPARTMENT AT [facility] Provider Note   CSN: [id number] Arrival date & time: 04/27/23  1621     History  Chief Complaint  Patient presents with   Shortness of Breath    Tracy Pugh is a 42 y.o. male.  42 year old male presents to the ER with worsening SHOB, tightness, wheezing, symptoms worse with any degree of activity/exertion. Seen in the ER 10/24 and 10/27, diagnosed with CAP and dc on Vantin. Followed up with pulmonology clinic today and was restarted on his Nucala, sent to the ER for admission for IV steroids, thought to be recurrence of his eosinophilic pneumonia. Denies fevers, chills. States cough is productive with yellow/pink tinged sputum. Denies history of PE/DVT, lower ext edema. No other complaints or concerns.        Home Medications Prior to Admission medications   Medication Sig Start Date End Date Taking? Authorizing Provider  albuterol (PROVENTIL) (2.5 MG/3ML) 0.083% nebulizer solution Take 3 mLs (2.5 mg total) by nebulization every 6 (six) hours as needed for wheezing or shortness of breath. 04/22/23   Cobb, Ruby Cola, NP  albuterol (VENTOLIN HFA) 108 (90 Base) MCG/ACT inhaler INHALE 2 PUFFS INTO THE LUNGS EVERY 6 (SIX) HOURS AS NEEDED FOR WHEEZING OR SHORTNESS OF BREATH. 04/21/22 04/21/23  Hunsucker, Lesia Sago, MD  BREO ELLIPTA 200-25 MCG/ACT AEPB INHALE 1 PUFF DAILY INTO LUNGS 12/09/22   Hunsucker, Lesia Sago, MD  cetirizine (ZYRTEC) 10 MG tablet Take 10 mg by mouth daily.    [provider]  famotidine (PEPCID) 20 MG tablet Take 1 tablet (20 mg total) by mouth 2 (two) times daily. 04/19/23   Kommor, Madison, MD  fluticasone [facility] ALLERGY RELIEF) 50 MCG/ACT nasal spray Place 1 spray into both nostrils 2 (two) times daily. 01/06/23   Hunsucker, Lesia Sago, MD  levofloxacin (LEVAQUIN) 500 MG tablet Take 1 tablet (500 mg total) by mouth daily. 04/22/23   Cobb, Ruby Cola, NP  Mepolizumab (NUCALA) 100 MG/ML SOAJ  Inject 1 mL (100 mg total) into the skin every 28 (twenty-eight) days. 04/27/23   Hunsucker, Lesia Sago, MD  pantoprazole (PROTONIX) 40 MG tablet Take 1 tablet (40 mg total) by mouth daily. Patient not taking: Reported on 04/18/2023 04/16/23   Prosperi, Christian H, PA-C  sodium chloride (OCEAN) 0.65 % SOLN nasal spray Place 1 spray into both nostrils as needed for congestion. 03/13/20   Couture, Cortni S, PA-C  sucralfate (CARAFATE) 1 g tablet Take 1 tablet (1 g total) by mouth 4 (four) times daily -  with meals and at bedtime. As needed Patient not taking: Reported on 04/18/2023 04/16/23   Prosperi, Christian H, PA-C      Allergies    Patient has no known allergies.    Review of Systems   Review of Systems Negative except as per HPI Physical Exam Updated Vital Signs BP (!) 125/92   Pulse (!) 105   Temp 98.8 F (37.1 C) (Oral)   Resp (!) 22   Wt 81 kg   SpO2 91%   BMI 28.82 kg/m  Physical Exam Vitals and nursing note reviewed.  Constitutional:      General: He is not in acute distress.    Appearance: He is well-developed. He is not diaphoretic.  HENT:     Head: Normocephalic and atraumatic.  Cardiovascular:     Rate and Rhythm: Regular rhythm. Tachycardia present.  Pulmonary:     Effort: Tachypnea present.     Breath sounds: Examination  of the right-lower field reveals decreased breath sounds. Examination of the left-lower field reveals decreased breath sounds. Decreased breath sounds present.  Abdominal:     Palpations: Abdomen is soft.     Tenderness: There is no abdominal tenderness.  Musculoskeletal:     Cervical back: Neck supple.     Right lower leg: No tenderness. No edema.     Left lower leg: No tenderness. No edema.  Skin:    General: Skin is warm and dry.     Findings: No erythema or rash.  Neurological:     Mental Status: He is alert and oriented to person, place, and time.  Psychiatric:        Behavior: Behavior normal.     ED Results / Procedures /  Treatments   Labs (all labs ordered are listed, but only abnormal results are displayed) Labs Reviewed  COMPREHENSIVE METABOLIC PANEL - Abnormal; Notable for the following components:      Result Value   CO2 21 (*)    Glucose, Bld 103 (*)    Calcium 8.7 (*)    Total Bilirubin 1.7 (*)    All other components within normal limits  CBC WITH DIFFERENTIAL/PLATELET - Abnormal; Notable for the following components:   WBC 35.4 (*)    Hemoglobin 17.8 (*)    MCHC 36.1 (*)    Platelets 600 (*)    Neutro Abs 8.4 (*)    Monocytes Absolute 1.6 (*)    Eosinophils Absolute 21.7 (*)    Basophils Absolute 0.2 (*)    Abs Immature Granulocytes 0.10 (*)    All other components within normal limits  CULTURE, BLOOD (ROUTINE X 2)  CULTURE, BLOOD (ROUTINE X 2)  LACTIC ACID, PLASMA  PATHOLOGIST SMEAR REVIEW    EKG EKG Interpretation Date/Time:  Monday April 27 2023 16:36:44 EST Ventricular Rate:  115 PR Interval:  128 QRS Duration:  86 QT Interval:  312 QTC Calculation: 431 R Axis:   66  Text Interpretation: Sinus tachycardia Otherwise normal ECG When compared with ECG of 18-Apr-2023 22:24,  Nonspecific TW changes compared to prior Confirmed by Alvira Monday ([id number]) on 04/27/2023 4:56:47 PM  Radiology No results found.  Procedures Procedures    Medications Ordered in ED Medications  albuterol (VENTOLIN HFA) 108 (90 Base) MCG/ACT inhaler 2 puff (has no administration in time range)  cefTRIAXone (ROCEPHIN) 1 g in sodium chloride 0.9 % 100 mL IVPB (1 g Intravenous New Bag/Given 04/27/23 1750)  methylPREDNISolone sodium succinate (SOLU-MEDROL) 125 mg/2 mL injection 125 mg (125 mg Intravenous Given 04/27/23 1730)  ipratropium-albuterol (DUONEB) 0.5-2.5 (3) MG/3ML nebulizer solution 3 mL (3 mLs Nebulization Given 04/27/23 1709)  doxycycline (VIBRA-TABS) tablet 100 mg (100 mg Oral Given 04/27/23 1748)    ED Course/ Medical Decision Making/ A&P                                 Medical  Decision Making Amount and/or Complexity of Data Reviewed Labs: ordered. Radiology: ordered.  Risk Prescription drug management. Decision regarding hospitalization.   This patient presents to the ED for concern of shob, this involves an extensive number of treatment options, and is a complaint that carries with it a high risk of complications and morbidity.  The differential diagnosis includes but not limited to PNA, PE, asthma exacerbation    Co morbidities that complicate the patient evaluation  Asthma, eosinophilic PNA   Additional history obtained:  External  records from outside source obtained and reviewed including note to pulmonology dated today with request for admission, IV steroids, pulm consult while admitted  Non contrast CT chest obtained in the ER 04/19/23 reviewed.    Lab Tests:  I Ordered, and personally interpreted labs.  The pertinent results include:  CMP without significant findings. Lactic acid less than 2. CBC with sig elevated WBC at 35.4 with increase in eos to 21.7K, neutrophils mildly elevated   Imaging Studies ordered:  I ordered imaging studies including CXR  I independently visualized and interpreted imaging which showed increased bilateral patchy infiltrates  I agree with the radiologist interpretation   Cardiac Monitoring: / EKG:  The patient was maintained on a cardiac monitor.  I personally viewed and interpreted the cardiac monitored which showed an underlying rhythm of: sinus tachycardia, rate 115   Consultations Obtained:  I requested consultation with the Dr. Alyssa Grove with [facility],  and discussed lab and imaging findings as well as pertinent plan - they recommend: admit   Problem List / ED Course / Critical interventions / Medication management  42 year old male presents with concern for eosinophilic pna, history of same, not improving on OP abx started 1 week ago, sent to the ER by pulm for admission for IV  steroids. WBC elevated with sig increase in eos. Provided with neb, solumedrol, abx for consideration of failed OP therapy in the setting of significant leukocytosis, states he is not currently on steroids. Admitted to hospitalist service, discussed consideration for PE as CT done last week was non con. Further radiation deferred at this time.  I ordered medication including solumedrol, rocephin, doxy  for steroids per recommendation of pulmonology, abx per recommendation of ER attending for PNA failed OP therapy. Duoneb, increased WOB Reevaluation of the patient after these medicines showed that the patient stayed the same I have reviewed the patients home medicines and have made adjustments as needed   Social Determinants of Health:  Has specialty care team, sees pulmonology    Test / Admission - Considered:  Admit          Final Clinical Impression(s) / ED Diagnoses Final diagnoses:  Pneumonia of both lungs due to infectious organism, unspecified part of lung    Rx / DC Orders ED Discharge Orders     None         Jeannie Fend, PA-C 04/27/23 1817    Laurence Spates, MD 04/27/23 1956

## 2023-04-27 NOTE — Patient Instructions (Addendum)
REFER TO emergency department for wheezing, asthma exacerbation. Per assessment of Dr. Judeth Horn, patient will require high-dose IV steroids  Your next NUCALA dose is due on 05/25/2023, 06/22/2023, and every 4 weeks thereafter  CONTINUE Breo 1 puff once daily  Your prescription will be shipped from [facility]. Their phone number is [phone] Please call to schedule shipment and confirm address. They will mail your medication to your home.  Your copay should be affordable. If you call the pharmacy and it is not affordable, please double-check that they are billing through your copay card as secondary coverage. That copay card information is: ID: [id number] Group Number: [id number] Rx BIN: [id number] PCN: [id number]  You will need to be seen by your provider in 3 to 4 months to assess how Nucala is working for you. Please ensure you have a follow-up appointment scheduled in February or March 2025. Call our clinic if you need to make this appointment.  Stay up to date on all routine vaccines: influenza, pneumonia, COVID19, Shingles  How to manage an injection site reaction: Remember the 5 C's: COUNTER - leave on the counter at least 30 minutes but up to overnight to bring medication to room temperature. This may help prevent stinging COLD - place something cold (like an ice gel pack or cold water bottle) on the injection site just before cleansing with alcohol. This may help reduce pain CLARITIN - use Claritin (generic name is loratadine) for the first two weeks of treatment or the day of, the day before, and the day after injecting. This will help to minimize injection site reactions CORTISONE CREAM - apply if injection site is irritated and itching CALL ME - if injection site reaction is bigger than the size of your fist, looks infected, blisters, or if you develop hives

## 2023-04-27 NOTE — Hospital Course (Addendum)
eosinophilic asthma, GERD, chronic sinusitis, and chronic eosinophilic pneumonia who was sent to the ED by his pulmonologist due to shortness of breath and wheezing. Patient was seen at the pulmonology office and was found to be uncomfortable with increased work of breathing and significant wheezing.  He was given a dose of Nucala and advised to present to the ED for admission for IV steroids.   Of note, patient was recently seen in the ED on 10/24 and 10/27.  He was diagnosed with community-acquired pneumonia, discharged on Vantin and advised to follow-up with his pulmonology.  ED course: Tachypneic and tachycardic on arrival with O2 sats low 90s on room air. Labs show WBC 35.4, Hgb 17.8, platelets 600, absolute neutrophil 84, eosinophil 21.7, K+ 3.7, creatinine 1.19, lactic acid 1.2. CXR showed multifocal pneumonia. CT chest on 10/27 showed findings of bronchopneumonia.  Received IV Solu-Medrol IV Rocephin, DuoNeb and oral doxycycline 100 mg x 1.  Patient was admitted to the hospitalist service and transferred to [facility].    Former smoker, quit in 2015   # Acute exacerbation of eosinophilic pneumonia Initially diagnosed in 2022 with bilateral R>L infiltrates, weeks of symptoms, eosinophils ~21k on CBC, and 85% eos on BAL. Patient responded to prednisone therapy was eventually weaned off.  Patient now presented with increased shortness of breath, wheezing and cough.  He has radiological evidence of pneumonia and significantly elevated eosinophil count to 21.7. 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 ***.  He has been referred to rheumatologist during  his last ED visit for further evaluation and diagnosis. -Follow-up with rheumatology in the outpatient

## 2023-04-27 NOTE — Progress Notes (Signed)
HPI Patient presents today to [facility] to see pharmacy team for [facility] new start.  Past medical history includes severe persistent eosinophilic asthma, pneumonia, history of allergic conjunctivitis of both eyes. History of chronic pansinusitis and nasal polyposis (sinus surgery in Dec 2021)  He was previously on Nucala for eosinophilic asthma which was started on 03/28/2021 and was discontinued at [facility] on 04/21/2022 because patient's symptoms were improved. Today, patient reports that he was still have asthma flares and perhaps set expectations for treatment too high. 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 D/w with Dr. Dimple Casey this morning  Today patient is visibly wheezing. Reports abx have not helped with his symptoms. He had hoped for longer term resolution of symptoms after last ED visit. He plans to go to hospital after today's visit because he is unable to work with his current limitations.   Respiratory Medications Current regimen: Breo Ellipta 200-31mcg (1 puff once daily) Patient reports no known adherence challenges  OBJECTIVE No Known Allergies  Outpatient Encounter Medications as of 04/27/2023  Medication Sig   Mepolizumab (NUCALA) 100 MG/ML SOAJ Inject 1 mL (100 mg total) into the skin every 28 (twenty-eight) days.   albuterol (PROVENTIL) (2.5 MG/3ML) 0.083% nebulizer solution Take 3 mLs (2.5 mg total) by nebulization every 6 (six) hours as needed for wheezing or shortness of breath.   albuterol (VENTOLIN HFA) 108 (90 Base) MCG/ACT inhaler INHALE 2 PUFFS INTO THE LUNGS EVERY 6 (SIX) HOURS AS NEEDED FOR WHEEZING OR SHORTNESS OF BREATH.   BREO ELLIPTA 200-25 MCG/ACT AEPB INHALE 1 PUFF DAILY INTO LUNGS   cetirizine (ZYRTEC) 10 MG tablet Take 10 mg by mouth daily.   famotidine (PEPCID) 20 MG tablet Take 1 tablet (20 mg  total) by mouth 2 (two) times daily.   fluticasone (FLONASE ALLERGY RELIEF) 50 MCG/ACT nasal spray Place 1 spray into both nostrils 2 (two) times daily.   levofloxacin (LEVAQUIN) 500 MG tablet Take 1 tablet (500 mg total) by mouth daily.   pantoprazole (PROTONIX) 40 MG tablet Take 1 tablet (40 mg total) by mouth daily. (Patient not taking: Reported on 04/18/2023)   sodium chloride (OCEAN) 0.65 % SOLN nasal spray Place 1 spray into both nostrils as needed for congestion.   sucralfate (CARAFATE) 1 g tablet Take 1 tablet (1 g total) by mouth 4 (four) times daily -  with meals and at bedtime. As needed (Patient not taking: Reported on 04/18/2023)   [DISCONTINUED] EPINEPHrine 0.3 mg/0.3 mL IJ SOAJ injection Inject 0.3 mg into the muscle as needed for anaphylaxis.   No facility-administered encounter medications on file as of 04/27/2023.      There is no immunization history on file for this patient.   PFTs     No data to display           Eosinophils Most recent blood eosinophil count was 2300 cells/microL taken on 11/06/22.   IgE: 4660 on 10/11/2020  Assessment   Biologics training for mepolizumab (Nucala)  Goals of therapy: Mechanism of Action: Not fully understood. It does act an interleukin-5 (IL-5) antagonist monoclonal antibody that reduces the production and survival of eosinophils by blocking the binding of IL-5 to the alpha chain of the receptor complex on the eosinophil cell surface. Reviewed that Nucala is add-on medication and patient must continue maintenance inhaler  regimen. Response to therapy: may take 3 months to 6 months to determine efficacy. Discussed that patients generally feel improvement sooner than 3 months.  Side effects: headache (19%), injection site reaction (7-15%), antibody development (6%), backache (5%), fatigue (5%)  Dose: 100 mg subcutaneously every 4 weeks  Administration/Storage:  Reviewed administration sites of thigh or abdomen (at least 2-3  inches away from abdomen). Reviewed the upper arm is only appropriate if caregiver is administering injection  Do not shake the reconstituted solution as this could lead to product foaming or precipitation. Solution should be clear to opalescent and colorless to pale yellow or pale brown, essentially particle free. Small air bubbles, however, are expected and acceptable. If particulate matter remains in the solution or if the solution appears cloudy or milky, discard the solution.  Reviewed storage of medication in refrigerator. Reviewed that Nucala can be stored at room temperature in unopened carton for up to 7 days.  Access: Approval of Nucala through: insurance Patient enrolled into copay card program to help with copay assistance.  Patient self-administered Nucala 100mg /mL in right lower abdomen using sample Nucala 100mg /mL autoinjector pen NDC: [phone] Lot: [id number] Expiration: 10/2024  Patient monitored for 30 minutes for adverse reaction.  Patient tolerated well.  Injection site noted. Patient denies itchiness and irritation at injection., No swelling or redness noted., and Reviewed injection site reaction management with patient verbally and printed information for review in AVS  Medication Reconciliation  A drug regimen assessment was performed, including review of allergies, interactions, disease-state management, dosing and immunization history. Medications were reviewed with the patient, including name, instructions, indication, goals of therapy, potential side effects, importance of adherence, and safe use.  Drug interaction(s): none noted  PLAN Patient referred to ED today after assessment by Dr. Judeth Horn. Will need high-dose IV steroids to manage his exacerbation Patient reports O2 sats below 90% this morning. Reports last rescue inhaler use was in morning. No improvement in symptoms since starting antibiotics.  He has about 3 days of abx course remaining. Continue  Nucala 100mg  every 4 weeks. Next dose is due 05/25/2023 and every 4 weeks thereafter. Rx sent to: [facility]: [phone]. Patient provided with pharmacy phone number and advised to call later this week to schedule shipment to home. Patient provided with copay card information to provide to pharmacy if quoted copay exceeds $5 per month. Reviewed that EGPA dx would lead to same treatment recommendation of Nucala but require more formal workup and biopsy. Continue maintenance asthma regimen of: Breo Ellipta 200-84mcg (1 puff once daily)  All questions encouraged and answered.  Instructed patient to reach out with any further questions or concerns.  Thank you for allowing pharmacy to participate in this patient's care.  This appointment required 45 minutes of patient care (this includes precharting, chart review, review of results, face-to-face care, etc.).   Chesley Mires, PharmD, MPH, BCPS, CPP Clinical Pharmacist (Rheumatology and Pulmonology)

## 2023-04-27 NOTE — ED Triage Notes (Signed)
Recurrent shortness of breath , treated for pneumonia on 10/26. Unable to speak full sentence in triage . Hx asthma . Took 6 days of his antibiotic.  Persistent cough .

## 2023-04-27 NOTE — Progress Notes (Signed)
@Patient  ID: Tracy Pugh, male    DOB: 01/27/1981, 42 y.o.   MRN: [id number]  No chief complaint on file.   Referring provider: No ref. provider found  HPI:   42 y.o. 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  Nucala discontinued follow-up 2023 after well-controlled disease.  Recurrence of symptoms with chest x-ray changes 10/2022 concerning for recurrence of eosinophilic pneumonia placed on steroid taper.  In clinic today for new start or restart Nucala.  Very wheezy.  Looks uncomfortable.  Increased work of breathing.  Recent ED visit.  Demonstrated left lower lobe infiltrate, scattered groundglass opacity and interlobular septal thickening in bilateral lower lobes on my review interpretation.  Eosinophil count 3.8K.  High suspicion for recurrence of eosinophilic pneumonia.  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  Prescribed antibiotics.  Unsurprisingly, not improved.  Given wheeze and significant work of breathing, recommend he go to the ED.  HPI at initial visit: Patient was diagnosed with COVID in 06/2020.  And symptoms had issues with ongoing cough, shortness of breath.  This worsened gradually over time to the point where he needed hospitalization March 2022.  Treated with antibiotics.  Treated with steroids.  Minimal improvement for a bit of time.  Worsening.  Subsequently readmitted 09/2020.  CT chest reviewed and interpreted as bilateral right greater than left groundglass and denser infiltrates throughout.  Chest x-ray on admission demonstrated right greater than left upper and lower lobe infiltrates on my interpretation as well.  Peripheral eosinophilia is around 21,000.  Did not abate with couple doses of steroids.  Had BAL performed which demonstrated a 5% eosinophils.  Was placed on prednisone 40 mg daily.  Reports gradual improvement in symptoms.  No cough.  Dyspnea essentially gone.  Feels well.  Reviewed labs during  hospitalization which demonstrated negative ANCA, Strongyloides IgG negative, elevated ESR and CRP.  Infectious work-up negative.  IgE greatly elevated in the several thousand range.  PMH: Asthma Surgical history: Sinus surgery in 2021 Family history: Allergies in freshly relatives, otherwise no respiratory illnesses in first-degree relatives Social history: Former smoker, quit in 2015, lives in [place]   Questionaires / Pulmonary Flowsheets:   ACT:  Asthma Control Test ACT Total Score  06/28/2021  8:58 AM 19  02/13/2021  4:21 PM 21  01/16/2021  4:32 PM 15    MMRC: mMRC Dyspnea Scale mMRC Score  10/23/2020 11:03 AM 0    Epworth:      No data to display          Tests:   FENO:  No results found for: "NITRICOXIDE"  PFT:     No data to display          WALK:      No data to display          Imaging: Personally reviewed and as per EMR discussion this note CT Chest Wo Contrast  Result Date: 04/19/2023 CLINICAL DATA:  Pneumonia, complication suspected, x-ray done. Pulmonary abnormality on same-day CT abdomen and pelvis. EXAM: CT CHEST WITHOUT CONTRAST TECHNIQUE: Multidetector CT imaging of the chest was performed following the standard protocol without IV contrast. RADIATION DOSE REDUCTION: This exam was performed according to the departmental dose-optimization program which includes automated exposure control, adjustment of the mA and/or kV according to patient size and/or use of iterative reconstruction technique. COMPARISON:  Radiograph 04/16/2023 and CT chest 11/16/2020 FINDINGS: Cardiovascular: Normal heart size. No pericardial effusion. Normal caliber thoracic aorta. Mediastinum/Nodes: Increased  size of multiple mediastinal lymph nodes compared to 11/16/2020. For example a precarinal lymph node measures 1.0 cm in short axis on series 2/image 30, previously 0.7 cm. Esophagus is unremarkable. Lungs/Pleura: Multiple peripheral nodules within the trachea and  mainstem bronchi. Bronchial wall thickening in the left lower lobe. Centrilobular and tree-in-bud opacities in the lower lungs. Interlobular septal thickening in the lower lungs. 4.9 x 4.0 cm ground-glass opacity in the left lower lobe. No pleural effusion or pneumothorax. Upper Abdomen: See separate report from same day CT abdomen and pelvis. Musculoskeletal: No acute fracture. IMPRESSION: 1. Findings compatible with bronchopneumonia. This includes a 4.9 cm ground-glass and consolidative opacity in the left lower lobe. Follow-up after treatment is recommended to ensure resolution and exclude underlying malignancy. 2. Multiple peripheral nodules within the trachea and mainstem bronchi. Findings may represent adherent debris/secretions. Differential consideration includes tracheobronchial papillomatosis. Continued attention on follow-up. 3. Increased size of multiple mediastinal lymph nodes compared to 11/16/2020. Attention on follow-up. Electronically Signed   By: Minerva Fester M.D.   On: 04/19/2023 02:39   CT ABDOMEN PELVIS W CONTRAST  Result Date: 04/19/2023 CLINICAL DATA:  Upper abdominal pain with nausea and vomiting EXAM: CT ABDOMEN AND PELVIS WITH CONTRAST TECHNIQUE: Multidetector CT imaging of the abdomen and pelvis was performed using the standard protocol following bolus administration of intravenous contrast. RADIATION DOSE REDUCTION: This exam was performed according to the departmental dose-optimization program which includes automated exposure control, adjustment of the mA and/or kV according to patient size and/or use of iterative reconstruction technique. CONTRAST:  OMNIPAQUE IOHEXOL 300 MG/ML  SOLN COMPARISON:  None Available. FINDINGS: Lower chest: Interlobular septal thickening in the right middle lobe. Bronchial wall thickening and centrilobular micro nodules and tree-in-bud opacities in the lower lungs. Patchy ground-glass opacities greatest in the left lower lobe. Hepatobiliary:  Unremarkable liver. Normal gallbladder. No biliary dilation. Pancreas: Unremarkable. Spleen: Unremarkable. Adrenals/Urinary Tract: Normal adrenal glands. No urinary calculi or hydronephrosis. Bladder is unremarkable. Stomach/Bowel: Normal caliber large and small bowel. No bowel wall thickening. The appendix is normal.Stomach is within normal limits. Vascular/Lymphatic: No significant vascular findings are present. No enlarged abdominal or pelvic lymph nodes. Reproductive: Unremarkable. Other: No free intraperitoneal fluid or air. Musculoskeletal: No acute fracture. IMPRESSION: 1. No acute abnormality in the abdomen or pelvis. 2. Bronchopneumonia in the lower lungs. Electronically Signed   By: Minerva Fester M.D.   On: 04/19/2023 01:17   DG Chest Portable 1 View  Result Date: 04/16/2023 CLINICAL DATA:  Shortness of breath. EXAM: PORTABLE CHEST 1 VIEW COMPARISON:  Chest radiograph 11/06/2022. FINDINGS: Clear lungs. Normal heart size and mediastinal contours. No pleural effusion or pneumothorax. Visualized bones and upper abdomen are unremarkable. IMPRESSION: No evidence of acute cardiopulmonary disease. Electronically Signed   By: Orvan Falconer M.D.   On: 04/16/2023 08:16     Lab Results: Personally reviewed CBC    Component Value Date/Time   WBC 18.7 (H) 04/18/2023 2228   RBC 5.47 04/18/2023 2228   HGB 17.3 (H) 04/18/2023 2228   HCT 48.6 04/18/2023 2228   PLT 412 (H) 04/18/2023 2228   MCV 88.8 04/18/2023 2228   MCH 31.6 04/18/2023 2228   MCHC 35.6 04/18/2023 2228   RDW 12.0 04/18/2023 2228   LYMPHSABS 1.9 04/18/2023 2238   MONOABS 1.3 (H) 04/18/2023 2238   EOSABS 3.8 (H) 04/18/2023 2238   BASOSABS 0.1 04/18/2023 2238    BMET    Component Value Date/Time   NA 135 04/18/2023 2228   K 3.8  04/18/2023 2228   CL 102 04/18/2023 2228   CO2 24 04/18/2023 2228   GLUCOSE 105 (H) 04/18/2023 2228   BUN 9 04/18/2023 2228   CREATININE 1.18 04/18/2023 2228   CALCIUM 8.9 04/18/2023 2228    GFRNONAA >60 04/18/2023 2228   GFRAA >60 03/13/2020 1657    BNP    Component Value Date/Time   BNP 23.3 10/09/2020 0339    ProBNP No results found for: "PROBNP"  Specialty Problems       Pulmonary Problems   Mild persistent asthma without complication   Other allergic rhinitis   Acute hypoxemic respiratory failure (HCC)   Asthma exacerbation   Multifocal pneumonia    No Known Allergies   There is no immunization history on file for this patient.  Past Medical History:  Diagnosis Date   Asthma     Tobacco History: Social History   Tobacco Use  Smoking Status Former   Current packs/day: 0.00   Types: Cigarettes   Start date: 2002   Quit date: 2019   Years since quitting: 5.8  Smokeless Tobacco Never   Counseling given: Not Answered    Continue to not smoke  Outpatient Encounter Medications as of 04/27/2023  Medication Sig   Mepolizumab (NUCALA) 100 MG/ML SOAJ Inject 1 mL (100 mg total) into the skin every 28 (twenty-eight) days.   albuterol (PROVENTIL) (2.5 MG/3ML) 0.083% nebulizer solution Take 3 mLs (2.5 mg total) by nebulization every 6 (six) hours as needed for wheezing or shortness of breath.   albuterol (VENTOLIN HFA) 108 (90 Base) MCG/ACT inhaler INHALE 2 PUFFS INTO THE LUNGS EVERY 6 (SIX) HOURS AS NEEDED FOR WHEEZING OR SHORTNESS OF BREATH.   BREO ELLIPTA 200-25 MCG/ACT AEPB INHALE 1 PUFF DAILY INTO LUNGS   cetirizine (ZYRTEC) 10 MG tablet Take 10 mg by mouth daily.   famotidine (PEPCID) 20 MG tablet Take 1 tablet (20 mg total) by mouth 2 (two) times daily.   fluticasone (FLONASE ALLERGY RELIEF) 50 MCG/ACT nasal spray Place 1 spray into both nostrils 2 (two) times daily.   levofloxacin (LEVAQUIN) 500 MG tablet Take 1 tablet (500 mg total) by mouth daily.   pantoprazole (PROTONIX) 40 MG tablet Take 1 tablet (40 mg total) by mouth daily. (Patient not taking: Reported on 04/18/2023)   sodium chloride (OCEAN) 0.65 % SOLN nasal spray Place 1 spray into  both nostrils as needed for congestion.   sucralfate (CARAFATE) 1 g tablet Take 1 tablet (1 g total) by mouth 4 (four) times daily -  with meals and at bedtime. As needed (Patient not taking: Reported on 04/18/2023)   [DISCONTINUED] EPINEPHrine 0.3 mg/0.3 mL IJ SOAJ injection Inject 0.3 mg into the muscle as needed for anaphylaxis.   No facility-administered encounter medications on file as of 04/27/2023.     Review of Systems  Review of Systems  N/a Physical Exam  There were no vitals taken for this visit.  Wt Readings from Last 5 Encounters:  04/18/23 180 lb (81.6 kg)  04/16/23 176 lb (79.8 kg)  01/06/23 176 lb (79.8 kg)  11/06/22 176 lb (79.8 kg)  04/21/22 176 lb 9.6 oz (80.1 kg)    BMI Readings from Last 5 Encounters:  04/18/23 29.05 kg/m  04/16/23 28.41 kg/m  01/06/23 28.41 kg/m  11/06/22 28.41 kg/m  04/21/22 28.50 kg/m     Physical Exam General: Well-appearing, no acute distress Eyes: EOMI, icterus Neck: Supple no JVP Cardiovascular: Regular rhythm, no murmur Pulmonary: Diffuse wheeze, mild increased work of breathing,  normal work of breathing MSK: No synovitis, no joint effusion Neuro: Normal gait, no weakness Psych: Normal mood, full affect   Assessment & Plan:   Chronic Eosinophilic pneumonia: Diagnosed 1610 bilateral R>L infiltrates, weeks of symptoms, Eos ~21k on CBC, 85% eos on BAL. Much improved symptomatically on prednisone 40 mg daily (0.5 mg/kg). Symptoms recurred on 10 mg of prednisone during initial taper.  Chest x-ray with worsened interstitial markings.  He was placed on a slower taper at 20 mg.  He had improved symptoms.  Exam improved.  Weaned off steroids.  Eventual remission with Nucala.  Subsequent discontinued.  Recent urgent care visit 07/2022 told had pneumonia, recurrent symptoms despite 2 courses of antibiotics.  Suspicious for recurrence of chronic eosinophilic pneumonia.  Chest x-ray with mild interstitial bronchitic changes.  CT  03/2022 with frank infiltrate.  And increased interlobular septal thickening.  Eos are rising.  Nucala administered today.  He needs to go to the ED for IV steroids.  Admission for IV steroids.  Would plan a long taper over the course of 8 to 12 weeks thereafter.  Recommend pulmonary consultation while admitted.  Eosinophilic asthma: preceded diagnosis as above.  With decompensation over the last several months.  Intermittent improvement with steroids.  Rising eosinophil count.  Sent to ED.  See above.   No follow-ups on file.   Karren Burly, MD 04/27/2023

## 2023-04-27 NOTE — ED Notes (Signed)
[facility] at bedside 

## 2023-04-28 DIAGNOSIS — J189 Pneumonia, unspecified organism: Secondary | ICD-10-CM

## 2023-04-28 DIAGNOSIS — J8281 Chronic eosinophilic pneumonia: Secondary | ICD-10-CM | POA: Diagnosis not present

## 2023-04-28 LAB — CBC WITH DIFFERENTIAL/PLATELET
Abs Immature Granulocytes: 0.32 10*3/uL — ABNORMAL HIGH (ref 0.00–0.07)
Basophils Absolute: 0.1 10*3/uL (ref 0.0–0.1)
Basophils Relative: 1 %
Eosinophils Absolute: 1.8 10*3/uL — ABNORMAL HIGH (ref 0.0–0.5)
Eosinophils Relative: 13 %
HCT: 46.8 % (ref 39.0–52.0)
Hemoglobin: 15.8 g/dL (ref 13.0–17.0)
Immature Granulocytes: 2 %
Lymphocytes Relative: 18 %
Lymphs Abs: 2.4 10*3/uL (ref 0.7–4.0)
MCH: 30.7 pg (ref 26.0–34.0)
MCHC: 33.8 g/dL (ref 30.0–36.0)
MCV: 90.9 fL (ref 80.0–100.0)
Monocytes Absolute: 0.4 10*3/uL (ref 0.1–1.0)
Monocytes Relative: 3 %
Neutro Abs: 8.8 10*3/uL — ABNORMAL HIGH (ref 1.7–7.7)
Neutrophils Relative %: 63 %
Platelets: 441 10*3/uL — ABNORMAL HIGH (ref 150–400)
RBC: 5.15 MIL/uL (ref 4.22–5.81)
RDW: 11.9 % (ref 11.5–15.5)
WBC: 13.8 10*3/uL — ABNORMAL HIGH (ref 4.0–10.5)
nRBC: 0 % (ref 0.0–0.2)

## 2023-04-28 LAB — BASIC METABOLIC PANEL
Anion gap: 14 (ref 5–15)
BUN: 10 mg/dL (ref 6–20)
CO2: 20 mmol/L — ABNORMAL LOW (ref 22–32)
Calcium: 8.7 mg/dL — ABNORMAL LOW (ref 8.9–10.3)
Chloride: 104 mmol/L (ref 98–111)
Creatinine, Ser: 0.84 mg/dL (ref 0.61–1.24)
GFR, Estimated: >60 mL/min (ref >60)
Glucose, Bld: 152 mg/dL — ABNORMAL HIGH (ref 70–99)
Potassium: 4.5 mmol/L (ref 3.5–5.1)
Sodium: 138 mmol/L (ref 135–145)

## 2023-04-28 LAB — EXPECTORATED SPUTUM ASSESSMENT W GRAM STAIN, RFLX TO RESP C

## 2023-04-28 LAB — MAGNESIUM: Magnesium: 2.5 mg/dL — ABNORMAL HIGH (ref 1.7–2.4)

## 2023-04-28 LAB — PROCALCITONIN: Procalcitonin: <0.1 ng/mL

## 2023-04-28 LAB — HIV ANTIBODY (ROUTINE TESTING W REFLEX): HIV Screen 4th Generation wRfx: NONREACTIVE

## 2023-04-28 LAB — PHOSPHORUS: Phosphorus: 4.8 mg/dL — ABNORMAL HIGH (ref 2.5–4.6)

## 2023-04-28 LAB — PATHOLOGIST SMEAR REVIEW

## 2023-04-28 MED ORDER — IPRATROPIUM-ALBUTEROL 0.5-2.5 (3) MG/3ML IN SOLN
3.0000 mL | Freq: Four times a day (QID) | RESPIRATORY_TRACT | Status: DC
  Administered 2023-04-28 (×3): 3 mL via RESPIRATORY_TRACT
  Filled 2023-04-28 (×3): qty 3

## 2023-04-28 MED ORDER — BUDESONIDE 0.25 MG/2ML IN SUSP
0.2500 mg | Freq: Two times a day (BID) | RESPIRATORY_TRACT | Status: DC
  Administered 2023-04-28 – 2023-04-29 (×3): 0.25 mg via RESPIRATORY_TRACT
  Filled 2023-04-28 (×3): qty 2

## 2023-04-28 NOTE — Progress Notes (Signed)
SATURATION QUALIFICATIONS: (This note is used to comply with regulatory documentation for home oxygen)  Patient Saturations on Room Air at Rest = 92%  Patient Saturations on Room Air while Ambulating = 88%  Patient Saturations on 3 Liters of oxygen while Ambulating = 93%  Please briefly explain why patient needs home oxygen:   Pt on 3L at rest is 94- 95% pt on ambulation even on 3 litters is labored. HR on room air while ambulating 109, on 3L and ambulating 95-98

## 2023-04-28 NOTE — Plan of Care (Signed)

## 2023-04-28 NOTE — Progress Notes (Signed)
PROGRESS NOTE  Tracy Pugh    DOB: [birth year], 42 y.o.  ZDG:[id number]    Code Status: Full Code   DOA: 04/27/2023   LOS: 1   Brief hospital course  Tracy Pugh is a 42 y.o. 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 Given his audible wheezing and increased work of breathing, the patient was advised to go to the ER for further evaluation and management.   ED Course: Temperature 98.5.  BP 123/79, pulse 96, respiration rate 17, saturation 92% on room air.  Lab studies notable for WBC 35.4, hemoglobin 17.8, platelet count 600.  Serum bicarb 21, glucose 103.  T. bili 1.7.  Lactic acid 1.2. Chest x-ray revealed multifocal pneumonia.    The patient was started on empiric IV antibiotics for community-acquired pneumonia.  The patient also received 1 dose of IV Solu-Medrol 125 x 1 for asthma exacerbation at [facility] ED.   04/28/23 -stable  Assessment & Plan  Principal Problem:   Eosinophilic pneumonia (HCC)  Recurrent eosinophilic multifocal pneumonia, POA- currently on 3L [place]. None at baseline. Received IV Rocephin and p.o. doxycycline in the ED Continue IV Rocephin, add IV azithromycin Obtain sputum culture, follow ID and sensitivities and narrow down antibiotics. Sputum needed to be recollected. Procalcitonin <0.10.  Still considerable wheezing on exam. IV steroids Bronchodilators ICS Antitussives - ambulate with pulse ox   Asthma exacerbation in the setting of the above Continue IV steroids, bronchodilators Early mobilization Ambulatory Home O2 evaluation on 04/28/2023.   Isolated elevated total bilirubin Nonspecific T.  bili 1.7 Monitor for now  Body mass index is 28.82 kg/m.  VTE ppx: enoxaparin (LOVENOX) injection 40 mg Start: 04/27/23 2200   Diet:     Diet   Diet regular Room service appropriate? Yes; Fluid consistency: Thin   Consultants: None   Subjective 04/28/23    Pt reports feeling improved since admission. No increased WOB or SOB at rest.    Objective   Vitals:   04/27/23 2135 04/28/23 0027 04/28/23 0044 04/28/23 0442  BP:   116/73 107/77  Pulse:   86 79  Resp:   18 18  Temp:   98 F (36.7 C) 97.7 F (36.5 C)  TempSrc:   Oral Oral  SpO2: 96% 95% 95% 94%  Weight:      Height:        Intake/Output Summary (Last 24 hours) at 04/28/2023 0731 Last data filed at 04/27/2023 1910 Gross per 24 hour  Intake 100 ml  Output --  Net 100 ml   Filed Weights   04/27/23 1629 04/27/23 1920  Weight: 81 kg 81 kg     Physical Exam:  General: awake, alert, NAD HEENT: atraumatic, clear conjunctiva, anicteric sclera, MMM, hearing grossly normal Respiratory: normal respiratory effort. Inhaled wheezing. Coughing stimulated by deep inhalation Cardiovascular: quick capillary refill, normal S1/S2, RRR, no JVD, murmurs Nervous: A&O x3. no gross focal neurologic deficits, normal speech Extremities: moves all equally, no edema, normal tone Skin: dry, intact, normal temperature, normal color. No rashes, lesions or ulcers on exposed skin Psychiatry: normal mood, congruent affect  Labs   I have personally reviewed the following labs and imaging studies CBC    Component Value Date/Time  WBC 13.8 (H) 04/28/2023 0533   RBC 5.15 04/28/2023 0533   HGB 15.8 04/28/2023 0533   HCT 46.8 04/28/2023 0533   PLT 441 (H) 04/28/2023 0533   MCV 90.9 04/28/2023 0533   MCH 30.7 04/28/2023 0533   MCHC 33.8 04/28/2023 0533   RDW 11.9 04/28/2023 0533   LYMPHSABS 2.4 04/28/2023 0533   MONOABS 0.4 04/28/2023 0533   EOSABS 1.8 (H) 04/28/2023 0533   BASOSABS 0.1 04/28/2023 0533      Latest Ref Rng &  Units 04/28/2023    5:33 AM 04/27/2023    4:46 PM 04/18/2023   10:28 PM  BMP  Glucose 70 - 99 mg/dL 161  096  045   BUN 6 - 20 mg/dL 10  10  9    Creatinine 0.61 - 1.24 mg/dL 4.09  8.11  9.14   Sodium 135 - 145 mmol/L 138  135  135   Potassium 3.5 - 5.1 mmol/L 4.5  3.7  3.8   Chloride 98 - 111 mmol/L 104  100  102   CO2 22 - 32 mmol/L 20  21  24    Calcium 8.9 - 10.3 mg/dL 8.7  8.7  8.9     DG Chest Portable 1 View  Result Date: 04/27/2023 CLINICAL DATA:  Shortness of breath, cough EXAM: PORTABLE CHEST 1 VIEW COMPARISON:  Radiograph 04/16/2023 and CT chest 04/19/2023 FINDINGS: Reticular and airspace opacities in the bilateral lower lungs are new since 04/16/2023. No pleural effusion or pneumothorax. Stable cardiomediastinal silhouette. No displaced rib fractures. IMPRESSION: Multifocal pneumonia, worsened compared with 04/19/2023. Follow-up to resolution is recommended Electronically Signed   By: Minerva Fester M.D.   On: 04/27/2023 19:02    Disposition Plan & Communication  Patient status: Inpatient  Admitted From: Home Planned disposition location: Home Anticipated discharge date: TBD pending respiratory status improvement  Family Communication: none at bedside    Author: Leeroy Bock, DO [facility] 04/28/2023, 7:31 AM   Available by Epic secure chat 7AM-7PM. If 7PM-7AM, please contact night-coverage.  [facility] contact information found on [email or web address].

## 2023-04-28 NOTE — Plan of Care (Signed)

## 2023-04-29 DIAGNOSIS — R17 Unspecified jaundice: Secondary | ICD-10-CM | POA: Diagnosis not present

## 2023-04-29 DIAGNOSIS — J45901 Unspecified asthma with (acute) exacerbation: Secondary | ICD-10-CM | POA: Diagnosis not present

## 2023-04-29 DIAGNOSIS — J8281 Chronic eosinophilic pneumonia: Secondary | ICD-10-CM | POA: Diagnosis not present

## 2023-04-29 DIAGNOSIS — E872 Acidosis, unspecified: Secondary | ICD-10-CM

## 2023-04-29 DIAGNOSIS — J189 Pneumonia, unspecified organism: Secondary | ICD-10-CM | POA: Diagnosis not present

## 2023-04-29 LAB — RESPIRATORY PANEL BY PCR

## 2023-04-29 LAB — COMPREHENSIVE METABOLIC PANEL
ALT: 20 U/L (ref 0–44)
AST: 13 U/L — ABNORMAL LOW (ref 15–41)
Albumin: 3.1 g/dL — ABNORMAL LOW (ref 3.5–5.0)
Alkaline Phosphatase: 72 U/L (ref 38–126)
Anion gap: 9 (ref 5–15)
BUN: 15 mg/dL (ref 6–20)
CO2: 22 mmol/L (ref 22–32)
Calcium: 8.2 mg/dL — ABNORMAL LOW (ref 8.9–10.3)
Chloride: 104 mmol/L (ref 98–111)
Creatinine, Ser: 0.94 mg/dL (ref 0.61–1.24)
GFR, Estimated: >60 mL/min (ref >60)
Glucose, Bld: 136 mg/dL — ABNORMAL HIGH (ref 70–99)
Potassium: 4.2 mmol/L (ref 3.5–5.1)
Sodium: 135 mmol/L (ref 135–145)
Total Bilirubin: 0.4 mg/dL (ref <1.2)
Total Protein: 6.5 g/dL (ref 6.5–8.1)

## 2023-04-29 LAB — STREP PNEUMONIAE URINARY ANTIGEN: Strep Pneumo Urinary Antigen: NEGATIVE

## 2023-04-29 LAB — CBC
HCT: 42 % (ref 39.0–52.0)
Hemoglobin: 14.7 g/dL (ref 13.0–17.0)
MCH: 31.6 pg (ref 26.0–34.0)
MCHC: 35 g/dL (ref 30.0–36.0)
MCV: 90.3 fL (ref 80.0–100.0)
Platelets: 477 10*3/uL — ABNORMAL HIGH (ref 150–400)
RBC: 4.65 MIL/uL (ref 4.22–5.81)
RDW: 12.2 % (ref 11.5–15.5)
WBC: 17.6 10*3/uL — ABNORMAL HIGH (ref 4.0–10.5)
nRBC: 0 % (ref 0.0–0.2)

## 2023-04-29 MED ORDER — GUAIFENESIN ER 600 MG PO TB12
1200.0000 mg | ORAL_TABLET | Freq: Two times a day (BID) | ORAL | Status: DC
  Administered 2023-04-29 – 2023-05-02 (×7): 1200 mg via ORAL
  Filled 2023-04-29 (×7): qty 2

## 2023-04-29 MED ORDER — SULFAMETHOXAZOLE-TRIMETHOPRIM 800-160 MG PO TABS
1.0000 | ORAL_TABLET | ORAL | Status: DC
  Administered 2023-04-29 – 2023-05-01 (×2): 1 via ORAL
  Filled 2023-04-29 (×2): qty 1

## 2023-04-29 MED ORDER — PANTOPRAZOLE SODIUM 40 MG PO TBEC
40.0000 mg | DELAYED_RELEASE_TABLET | Freq: Every day | ORAL | Status: DC
  Administered 2023-04-29: 40 mg via ORAL
  Filled 2023-04-29: qty 1

## 2023-04-29 MED ORDER — BUDESONIDE 0.5 MG/2ML IN SUSP
0.5000 mg | Freq: Two times a day (BID) | RESPIRATORY_TRACT | Status: DC
  Administered 2023-04-29 – 2023-05-02 (×6): 0.5 mg via RESPIRATORY_TRACT
  Filled 2023-04-29 (×6): qty 2

## 2023-04-29 MED ORDER — PANTOPRAZOLE SODIUM 40 MG PO TBEC
40.0000 mg | DELAYED_RELEASE_TABLET | Freq: Two times a day (BID) | ORAL | Status: DC
  Administered 2023-04-29 – 2023-05-02 (×7): 40 mg via ORAL
  Filled 2023-04-29 (×7): qty 1

## 2023-04-29 MED ORDER — IPRATROPIUM-ALBUTEROL 0.5-2.5 (3) MG/3ML IN SOLN
3.0000 mL | Freq: Three times a day (TID) | RESPIRATORY_TRACT | Status: DC
  Administered 2023-04-29 – 2023-05-02 (×11): 3 mL via RESPIRATORY_TRACT
  Filled 2023-04-29 (×11): qty 3

## 2023-04-29 MED ORDER — FLUTICASONE PROPIONATE 50 MCG/ACT NA SUSP
2.0000 | Freq: Two times a day (BID) | NASAL | Status: DC
  Administered 2023-04-29 – 2023-05-02 (×7): 2 via NASAL
  Filled 2023-04-29: qty 16

## 2023-04-29 MED ORDER — REVEFENACIN 175 MCG/3ML IN SOLN
175.0000 ug | Freq: Every day | RESPIRATORY_TRACT | Status: DC
  Administered 2023-04-29 – 2023-05-02 (×4): 175 ug via RESPIRATORY_TRACT
  Filled 2023-04-29 (×3): qty 3

## 2023-04-29 MED ORDER — HYDROCODONE BIT-HOMATROP MBR 5-1.5 MG/5ML PO SOLN: 5.0000 mL | Freq: Four times a day (QID) | ORAL | Status: DC | PRN

## 2023-04-29 MED ORDER — ARFORMOTEROL TARTRATE 15 MCG/2ML IN NEBU
15.0000 ug | INHALATION_SOLUTION | Freq: Two times a day (BID) | RESPIRATORY_TRACT | Status: DC
  Administered 2023-04-29 – 2023-05-02 (×7): 15 ug via RESPIRATORY_TRACT
  Filled 2023-04-29 (×7): qty 2

## 2023-04-29 NOTE — Plan of Care (Signed)

## 2023-04-29 NOTE — TOC CM/SW Note (Signed)
Transition of Care [facility]) - Inpatient Brief Assessment   Patient Details  Name: Tracy Pugh MRN: [id number] Date of Birth: [birth year]  [facility]) CM/SW Contact:    Otelia Santee, LCSW Phone Number: 04/29/2023, 2:34 PM   Clinical Narrative: Pt from home with spouse. Pt currently has O2 requirement. Not on O2 at baseline. TOC will follow for discharge need.    Transition of Care Asessment: Insurance and Status: Insurance coverage has been reviewed Patient has primary care physician: No Home environment has been reviewed: Single family home with spouse Prior level of function:: Independent Prior/Current Home Services: No current home services Social Determinants of Health Reivew: SDOH reviewed no interventions necessary Readmission risk has been reviewed: Yes Transition of care needs: transition of care needs identified, TOC will continue to follow

## 2023-04-29 NOTE — Progress Notes (Signed)
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  Patient seen in the ED noted to be tachycardic, tachypneic with sats of 91% on room air, chest x-ray done concerning for multifocal pneumonia.  Patient placed on empiric IV antibiotics for community-acquired pneumonia, IV steroids, nebulizer treatments.  Pulmonary consulted.   Assessment & Plan:   Principal Problem:   Eosinophilic pneumonia (HCC) Active Problems:   Pneumonia of both lungs due to infectious organism   Elevated bilirubin   Acidosis, metabolic  #1 recurrent eosinophilic multifocal pneumonia, POA -Patient presenting from pulmonary office with progressive worsening cough, shortness of breath, wheezing. -Chest x-ray done concerning for multifocal infiltrates suggestive of multifocal pneumonia. -Patient noted to have received IV Rocephin and oral doxycycline in the ED and placed on IV Rocephin and IV azithromycin. -Sputum Gram stain and cultures pending. -Check a urine Legionella antigen, urine pneumococcus antigen. -Check a respiratory viral panel. -Continue Pulmicort, scheduled DuoNebs, Claritin, Pepcid. -Placed on Mucinex twice daily, Flonase twice daily, PPI. -Continue IV Solu-Medrol and likely will need a slow steroid taper per pulmonary.. -Patient seen in consultation by pulmonary and Breo Ellipta discontinued patient started on Brovana. -Patient also  started on yupelri and Bactrim DS Monday Wednesday Friday per pulmonary. -Pulmonary following and appreciate input and recommendations.  2.  Asthma exacerbation -Secondary to problem #1. -Continue treatment as in problem #1. -Check ambulatory sats tomorrow.  3.  Mild anion gap metabolic acidosis -In the setting of asthma exacerbation and pneumonia. -Patient noted to have a bicarb of 21 on presentation with a anion gap of 14 and lactic acid level of 1.2. -Acidosis improving anion gap closed currently at 9. -Continue treatment as in problem #1.  4.  Isolated elevated total bilirubin -Resolved.   DVT prophylaxis: Lovenox Code Status: Full Family Communication: Updated patient.  No family at bedside. Disposition: Home when clinically improved and cleared by pulmonary.  Status is: Inpatient Remains inpatient appropriate because: Severity of illness   Consultants:  Pulmonary: Anders Simmonds, NP/Dr. Isaiah Serge 04/29/2023  Procedures:  Chest x-ray 04/27/2023   Antimicrobials:  Anti-infectives (From admission, onward)    Start     Dose/Rate Route Frequency Ordered Stop   04/29/23 1245  sulfamethoxazole-trimethoprim (BACTRIM DS) 800-160 MG per tablet 1 tablet        1 tablet Oral Once per day on Monday Wednesday Friday 04/29/23 1155     04/28/23 1000  cefTRIAXone (ROCEPHIN) 2 g in sodium chloride 0.9 % 100 mL IVPB        2 g 200 mL/hr over 30 Minutes Intravenous Every 24 hours 04/27/23 2010     04/27/23 2200  azithromycin (ZITHROMAX) 500 mg in dextrose 5 % 250 mL IVPB        500 mg 250 mL/hr over 60 Minutes Intravenous Every 24 hours 04/27/23 2010     04/27/23 1730  cefTRIAXone (ROCEPHIN) 1 g in sodium chloride 0.9 % 100 mL IVPB        1 g 200 mL/hr over  30 Minutes Intravenous  Once 04/27/23 1726 04/27/23 1910   04/27/23 1730  doxycycline (VIBRA-TABS) tablet 100 mg        100 mg Oral  Once 04/27/23 1726 04/27/23 1748         Subjective: Patient laying in bed.  Overall feels some  improvement with cough and shortness of breath since admission.  Still with some wheezing.  States most of his symptoms usually in the mornings and as the day goes by gets a little bit better.  Complains of symptoms when sleeping.  Objective: Vitals:   04/28/23 1944 04/28/23 1951 04/29/23 0521 04/29/23 1235  BP:  99/67 113/69 118/76  Pulse:  73 63 71  Resp:  19 18 18   Temp:  97.7 F (36.5 C) 97.9 F (36.6 C) 98.5 F (36.9 C)  TempSrc:  Oral Oral Oral  SpO2: 97% 98% 98% 97%  Weight:      Height:        Intake/Output Summary (Last 24 hours) at 04/29/2023 1837 Last data filed at 04/29/2023 1151 Gross per 24 hour  Intake 670 ml  Output --  Net 670 ml   Filed Weights   04/27/23 1629 04/27/23 1920  Weight: 81 kg 81 kg    Examination:  General exam: Appears calm and comfortable  Respiratory system: Some scattered coarse breath sounds.  Expiratory wheezing.  No crackles.  Fair air movement.  Speaking in full sentences.  No use of accessory muscles of respiration. Cardiovascular system: S1 & S2 heard, RRR. No JVD, murmurs, rubs, gallops or clicks. No pedal edema. Gastrointestinal system: Abdomen is nondistended, soft and nontender. No organomegaly or masses felt. Normal bowel sounds heard. Central nervous system: Alert and oriented. No focal neurological deficits. Extremities: Symmetric 5 x 5 power. Skin: No rashes, lesions or ulcers Psychiatry: Judgement and insight appear normal. Mood & affect appropriate.     Data Reviewed: I have personally reviewed following labs and imaging studies  CBC: Recent Labs  Lab 04/27/23 1646 04/28/23 0533 04/29/23 0505  WBC 35.4* 13.8* 17.6*  NEUTROABS 8.4* 8.8*  --   HGB 17.8* 15.8 14.7  HCT 49.3 46.8 42.0  MCV 86.6 90.9 90.3  PLT 600* 441* 477*    Basic Metabolic Panel: Recent Labs  Lab 04/27/23 1646 04/28/23 0533 04/29/23 0505  NA 135 138 135  K 3.7 4.5 4.2  CL 100 104 104  CO2 21* 20* 22  GLUCOSE 103* 152* 136*  BUN 10 10  15   CREATININE 1.19 0.84 0.94  CALCIUM 8.7* 8.7* 8.2*  MG  --  2.5*  --   PHOS  --  4.8*  --     GFR: Estimated Creatinine Clearance: 104.5 mL/min (by C-G formula based on SCr of 0.94 mg/dL).  Liver Function Tests: Recent Labs  Lab 04/27/23 1646 04/29/23 0505  AST 16 13*  ALT 17 20  ALKPHOS 109 72  BILITOT 1.7* 0.4  PROT 7.8 6.5  ALBUMIN 3.7 3.1*    CBG: No results for input(s): "GLUCAP" in the last 168 hours.   Recent Results (from the past 240 hour(s))  Expectorated Sputum Assessment w Gram Stain, Rflx to Resp Cult     Status: None   Collection Time: 04/27/23  1:10 AM   Specimen: Expectorated Sputum  Result Value Ref Range Status   Specimen Description EXPECTORATED SPUTUM  Final   Special Requests NONE  Final   Sputum evaluation   Final    Sputum specimen not acceptable for testing.  Please  recollect.   INFORMED ROGER RN OF RECOLLECT @ 0239 ON 04/28/2023 BY ABDULHALIM,M Performed at [facility], [address] [place], [place] [address]    Report Status 04/28/2023 FINAL  Final  Culture, blood (routine x 2)     Status: None (Preliminary result)   Collection Time: 04/27/23  5:02 PM   Specimen: BLOOD  Result Value Ref Range Status   Specimen Description   Final    BLOOD RIGHT ANTECUBITAL Performed at [facility], [address] [place], [place] [address]    Special Requests   Final    BOTTLES DRAWN AEROBIC AND ANAEROBIC Blood Culture adequate volume Performed at [facility], [address] [place], [place] [address]    Culture   Final    NO GROWTH 2 DAYS Performed at [facility], [address], [place], [place] [address]    Report Status PENDING  Incomplete  Culture, blood (routine x 2)     Status: None (Preliminary result)   Collection Time: 04/27/23  5:07 PM   Specimen: BLOOD RIGHT FOREARM  Result Value Ref Range Status   Specimen Description   Final    BLOOD RIGHT FOREARM Performed at [facility], [address], [place], [place] [address]    Special Requests   Final    BOTTLES DRAWN AEROBIC AND ANAEROBIC Blood Culture adequate volume Performed at [facility], [address] [place], [place] [address]    Culture   Final    NO GROWTH 2 DAYS Performed at [facility], [address] [place], [place] [address]    Report Status PENDING  Incomplete  Expectorated Sputum Assessment w Gram Stain, Rflx to Resp Cult     Status: None   Collection Time: 04/28/23  5:49 AM  Result Value Ref Range Status   Specimen Description EXPECTORATED SPUTUM  Final   Special Requests NONE  Final   Sputum evaluation   Final    THIS SPECIMEN IS ACCEPTABLE FOR SPUTUM CULTURE Performed at [facility], [address], [place], [place] [address]    Report Status 04/28/2023 FINAL  Final  Culture, Respiratory w Gram Stain     Status: None (Preliminary result)   Collection Time: 04/28/23  5:49 AM  Result Value Ref Range Status   Specimen Description   Final    EXPECTORATED SPUTUM Performed at [facility], [address] [place], [place] [address]    Special Requests   Final    NONE Reflexed from [id number] Performed at [facility], [address] [place], [place] [address]    Gram Stain   Final    RARE WBC PRESENT, PREDOMINANTLY PMN RARE GRAM NEGATIVE RODS    Culture   Final    TOO YOUNG TO READ Performed at [facility], [address] [place], [place] [address]    Report Status PENDING  Incomplete         Radiology Studies: No results found.      Scheduled Meds:  arformoterol  15 mcg Nebulization BID   budesonide (PULMICORT) nebulizer solution  0.5 mg Nebulization BID   enoxaparin (LOVENOX) injection  40 mg Subcutaneous Q24H   famotidine  20 mg Oral BID   fluticasone  2 spray Each Nare BID   guaiFENesin  1,200 mg Oral BID   ipratropium-albuterol  3 mL Nebulization TID   loratadine  10 mg  Oral Daily   methylPREDNISolone (SOLU-MEDROL) injection  40 mg Intravenous BID   pantoprazole  40 mg Oral BID   revefenacin  175 mcg Nebulization Daily   sulfamethoxazole-trimethoprim  1 tablet Oral Once per day on Monday Wednesday Friday   Continuous Infusions:  azithromycin 500 mg (04/28/23 2147)   cefTRIAXone (ROCEPHIN)  IV 2 g (04/29/23 1006)     LOS: 2 days    Time spent: 40 minutes    Ramiro Harvest, MD [facility]   To contact the attending provider between 7A-7P or the covering provider during after hours 7P-7A, please log into the web site [email or web address] and access using universal [place] password for that web site. If you do not have the password, please call the hospital operator.  04/29/2023, 6:37 PM

## 2023-04-29 NOTE — Consult Note (Signed)
NAME:  Tracy Pugh, MRN:  [id number], DOB:  06-12-1981, LOS: 2 ADMISSION DATE:  04/27/2023, CONSULTATION DATE:  11/6 REFERRING MD:  Janee Morn, CHIEF COMPLAINT:  eosinophilic PNA    History of Present Illness:  41 year old male w/ h/o severe asthma and eosinophilic PNA. Was on Nucala from fall 22 to 2023 (stopped back in 2023 as disease was well controlled). Had recurrence of symptoms May this year (2024) treated w/ steroid taper. Seen again in ER 10/27 this time w/ epigastric discomfort but CXR showed LLL infiltrate so was started on Vantin and follow up requested in the pulm office. On eval in office pt was wheezing, had increased accessory muscle use was very uncomfortable. Sent to ER for further evaluation and w/ need for IV systemic steroids  Pertinent  Medical History  Eosinophilic PNA (initial dx 2022 w/ ~21k eos on BAL) has had prior recurrence of symptoms off steroids.    Significant Hospital Events: Including procedures, antibiotic start and stop dates in addition to other pertinent events   11/4 seen in our clinic. Advised to go to ER. Started on IV steroids and abx 11/5  11/6 pulm asked to see.   Interim History / Subjective:  Feeling better day by day. Still has sig cough   Objective   Blood pressure 113/69, pulse 63, temperature 97.9 F (36.6 C), temperature source Oral, resp. rate 18, height 5\' 6"  (1.676 m), weight 81 kg, SpO2 98%.        Intake/Output Summary (Last 24 hours) at 04/29/2023 1104 Last data filed at 04/29/2023 0522 Gross per 24 hour  Intake 900 ml  Output --  Net 900 ml   Filed Weights   04/27/23 1629 04/27/23 1920  Weight: 81 kg 81 kg    Examination: General: resting in bed. No distress HENT: NCAT no JVD  Lungs: diffuse wheezing no accessory use at rest but still gets SOB w/ cough Cardiovascular: RRR Abdomen: soft  Extremities: no edema  Neuro: awake and oriented  GU: voids   Resolved Hospital Problem list     Assessment & Plan:   Recurrent eosinophilic pneumonia w/ asthmatic bronchitis flare.  -procal reassuring.  -rising WBC ct from steroids likely Plan Cont supplemental oxygen Will put him on triple combo nebulized therapy for now then plan on going back home on his regular regimen  Cont PPI (increase to BID w/ active reflux symptoms) Complete another 24 hrs of the pred at 40mg  bid, then start slow taper to slowly wean over 8-10 weeks as he will be on extended course will start prophylactic DS Bactric  CXR in am  Ck RVP F/u ustrep and legionella If u strep and legionella neg, cxr improving and PCT remains reassuring can dc abx   Best Practice (right click and "Reselect all SmartList Selections" daily)   Per primary   Labs   CBC: Recent Labs  Lab 04/27/23 1646 04/28/23 0533 04/29/23 0505  WBC 35.4* 13.8* 17.6*  NEUTROABS 8.4* 8.8*  --   HGB 17.8* 15.8 14.7  HCT 49.3 46.8 42.0  MCV 86.6 90.9 90.3  PLT 600* 441* 477*    Basic Metabolic Panel: Recent Labs  Lab 04/27/23 1646 04/28/23 0533 04/29/23 0505  NA 135 138 135  K 3.7 4.5 4.2  CL 100 104 104  CO2 21* 20* 22  GLUCOSE 103* 152* 136*  BUN 10 10 15   CREATININE 1.19 0.84 0.94  CALCIUM 8.7* 8.7* 8.2*  MG  --  2.5*  --  PHOS  --  4.8*  --    GFR: Estimated Creatinine Clearance: 104.5 mL/min (by C-G formula based on SCr of 0.94 mg/dL). Recent Labs  Lab 04/27/23 1646 04/28/23 0533 04/29/23 0505  PROCALCITON  --  <0.10  --   WBC 35.4* 13.8* 17.6*  LATICACIDVEN 1.2  --   --     Liver Function Tests: Recent Labs  Lab 04/27/23 1646 04/29/23 0505  AST 16 13*  ALT 17 20  ALKPHOS 109 72  BILITOT 1.7* 0.4  PROT 7.8 6.5  ALBUMIN 3.7 3.1*   No results for input(s): "LIPASE", "AMYLASE" in the last 168 hours. No results for input(s): "AMMONIA" in the last 168 hours.  ABG No results found for: "PHART", "PCO2ART", "PO2ART", "HCO3", "TCO2", "ACIDBASEDEF", "O2SAT"   Coagulation Profile: No results for input(s): "INR", "PROTIME" in  the last 168 hours.  Cardiac Enzymes: No results for input(s): "CKTOTAL", "CKMB", "CKMBINDEX", "TROPONINI" in the last 168 hours.  HbA1C: No results found for: "HGBA1C"  CBG: No results for input(s): "GLUCAP" in the last 168 hours.  Review of Systems:   Review of Systems  Constitutional:  Negative for fever and malaise/fatigue.  HENT:  Positive for congestion and sore throat.   Eyes: Negative.   Respiratory:  Positive for cough, hemoptysis, shortness of breath and wheezing.   Cardiovascular:  Positive for chest pain.  Gastrointestinal:  Positive for heartburn.  Genitourinary: Negative.   Musculoskeletal: Negative.   Skin:  Negative for rash.  Endo/Heme/Allergies: Negative.      Past Medical History:  He,  has a past medical history of Asthma.   Surgical History:   Past Surgical History:  Procedure Laterality Date   sinus polyp surgery     VIDEO BRONCHOSCOPY N/A 10/12/2020   Procedure: VIDEO BRONCHOSCOPY WITHOUT FLUORO WITH BRONCHOALVEOLAR LAVAGE;  Surgeon: Steffanie Dunn, DO;  Location: [facility] END[facility];  Service: Endoscopy;  Laterality: N/A;     Social History:   reports that he quit smoking about 5 years ago. His smoking use included cigarettes. He started smoking about 22 years ago. He has never used smokeless tobacco. He reports current alcohol use. He reports that he does not use drugs.   Family History:  His family history includes Rheum arthritis in his mother.   Allergies Allergies  Allergen Reactions   Vantin [Cefpodoxime] Hives     Home Medications  Prior to Admission medications   Medication Sig Start Date End Date Taking? Authorizing Provider  albuterol (PROVENTIL) (2.5 MG/3ML) 0.083% nebulizer solution Take 3 mLs (2.5 mg total) by nebulization every 6 (six) hours as needed for wheezing or shortness of breath. 04/22/23  Yes Cobb, Ruby Cola, NP  albuterol (VENTOLIN HFA) 108 (90 Base) MCG/ACT inhaler INHALE 2 PUFFS INTO THE LUNGS EVERY 6 (SIX) HOURS AS  NEEDED FOR WHEEZING OR SHORTNESS OF BREATH. 04/21/22 04/28/23 Yes Hunsucker, Lesia Sago, MD  BREO ELLIPTA 200-25 MCG/ACT AEPB INHALE 1 PUFF DAILY INTO LUNGS 12/09/22  Yes Hunsucker, Lesia Sago, MD  cetirizine (ZYRTEC) 10 MG tablet Take 10 mg by mouth daily.   Yes [provider]  famotidine (PEPCID) 20 MG tablet Take 1 tablet (20 mg total) by mouth 2 (two) times daily. 04/19/23  Yes Kommor, Madison, MD  fluticasone [facility] ALLERGY RELIEF) 50 MCG/ACT nasal spray Place 1 spray into both nostrils 2 (two) times daily. 01/06/23  Yes Hunsucker, Lesia Sago, MD  levofloxacin (LEVAQUIN) 500 MG tablet Take 1 tablet (500 mg total) by mouth daily. 04/22/23  Yes Cobb, Natalia Leatherwood  V, NP  Mepolizumab (NUCALA) 100 MG/ML SOAJ Inject 1 mL (100 mg total) into the skin every 28 (twenty-eight) days. 04/27/23  Yes Hunsucker, Lesia Sago, MD  sodium chloride (OCEAN) 0.65 % SOLN nasal spray Place 1 spray into both nostrils as needed for congestion. 03/13/20  Yes Couture, Cortni S, PA-C  pantoprazole (PROTONIX) 40 MG tablet Take 1 tablet (40 mg total) by mouth daily. Patient not taking: Reported on 04/18/2023 04/16/23   Prosperi, Christian H, PA-C  sucralfate (CARAFATE) 1 g tablet Take 1 tablet (1 g total) by mouth 4 (four) times daily -  with meals and at bedtime. As needed Patient not taking: Reported on 04/18/2023 04/16/23   Olene Floss, PA-C     Critical care time: NA

## 2023-04-30 ENCOUNTER — Inpatient Hospital Stay (HOSPITAL_COMMUNITY): Payer: Commercial Managed Care - PPO

## 2023-04-30 DIAGNOSIS — J189 Pneumonia, unspecified organism: Secondary | ICD-10-CM | POA: Diagnosis not present

## 2023-04-30 DIAGNOSIS — J45901 Unspecified asthma with (acute) exacerbation: Secondary | ICD-10-CM | POA: Diagnosis not present

## 2023-04-30 DIAGNOSIS — J8281 Chronic eosinophilic pneumonia: Secondary | ICD-10-CM | POA: Diagnosis not present

## 2023-04-30 DIAGNOSIS — R17 Unspecified jaundice: Secondary | ICD-10-CM | POA: Diagnosis not present

## 2023-04-30 LAB — BASIC METABOLIC PANEL
Anion gap: 10 (ref 5–15)
BUN: 16 mg/dL (ref 6–20)
CO2: 24 mmol/L (ref 22–32)
Calcium: 8.4 mg/dL — ABNORMAL LOW (ref 8.9–10.3)
Chloride: 103 mmol/L (ref 98–111)
Creatinine, Ser: 0.9 mg/dL (ref 0.61–1.24)
GFR, Estimated: >60 mL/min (ref >60)
Glucose, Bld: 141 mg/dL — ABNORMAL HIGH (ref 70–99)
Potassium: 4.5 mmol/L (ref 3.5–5.1)
Sodium: 137 mmol/L (ref 135–145)

## 2023-04-30 LAB — CBC
HCT: 43.1 % (ref 39.0–52.0)
Hemoglobin: 14.4 g/dL (ref 13.0–17.0)
MCH: 31 pg (ref 26.0–34.0)
MCHC: 33.4 g/dL (ref 30.0–36.0)
MCV: 92.7 fL (ref 80.0–100.0)
Platelets: 476 10*3/uL — ABNORMAL HIGH (ref 150–400)
RBC: 4.65 MIL/uL (ref 4.22–5.81)
RDW: 12.2 % (ref 11.5–15.5)
WBC: 12.2 10*3/uL — ABNORMAL HIGH (ref 4.0–10.5)
nRBC: 0 % (ref 0.0–0.2)

## 2023-04-30 LAB — MAGNESIUM: Magnesium: 2.5 mg/dL — ABNORMAL HIGH (ref 1.7–2.4)

## 2023-04-30 NOTE — Consult Note (Signed)
   NAME:  Tracy Pugh, MRN:  [id number], DOB:  04-Jul-1980, LOS: 3 ADMISSION DATE:  04/27/2023, CONSULTATION DATE:  11/6 REFERRING MD:  Janee Morn, CHIEF COMPLAINT:  eosinophilic PNA    History of Present Illness:   42 year old male w/ h/o severe asthma and eosinophilic PNA. Was on Nucala from fall 22 to 2023 (stopped back in 2023 as disease was well controlled). Had recurrence of symptoms May this year (2024) treated w/ steroid taper. Seen again in ER 10/27 this time w/ epigastric discomfort but CXR showed LLL infiltrate so was started on Vantin and follow up requested in the pulm office. On eval in office pt was wheezing, had increased accessory muscle use was very uncomfortable. 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   Significant Hospital Events: Including procedures, antibiotic start and stop dates in addition to other pertinent events   11/4 seen in our clinic. Advised to go to ER. Started on IV steroids and abx 11/5  11/6 pulm asked to see.   Interim History / Subjective:   Feels better.  Continues to have cough, wheezing though improved compared to yesterday.  Objective   Blood pressure 120/73, pulse 86, temperature 98.6 F (37 C), temperature source Oral, resp. rate 16, height 5\' 6"  (1.676 m), weight 81 kg, SpO2 95%.       No intake or output data in the 24 hours ending 04/30/23 1326  Filed Weights   04/27/23 1629 04/27/23 1920  Weight: 81 kg 81 kg    Examination: Blood pressure 120/73, pulse 86, temperature 98.6 F (37 C), temperature source Oral, resp. rate 16, height 5\' 6"  (1.676 m), weight 81 kg, SpO2 95%. Gen:      No acute distress HEENT:  EOMI, sclera anicteric Neck:     No masses; no thyromegaly Lungs:    Expiratory wheeze CV:         Regular rate and rhythm; no murmurs Abd:      + bowel sounds;  soft, non-tender; no palpable masses, no distension Ext:    No edema; adequate peripheral perfusion Skin:      Warm and dry; no rash Neuro: alert and oriented x 3 Psych: normal mood and affect   Chest x-ray with improved infiltrates.  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  -procal reassuring.  -Peripheral eosinophils improved on steroids, chest x-ray is better  Plan Cont supplemental oxygen On Brovana, Pulmicort, Yupelri nebs Cont PPI twice daily Reduce Solu-Medrol dose to 60 mg/day.  He will need a slow taper over 8 to 10 weeks Just restarted Nucala on 11/4 as an outpatient.  Will need to continue at discharge   Best Practice (right click and "Reselect all SmartList Selections" daily)   Per primary   Critical care time: NA   Chilton Greathouse MD [facility] See Amion for pager  If no response to pager , please call [phone] until 7pm After 7:00 pm call Elink  [phone] 04/30/2023, 1:32 PM

## 2023-04-30 NOTE — Plan of Care (Signed)
No acute events this shift. The patient has rested comfortably throughout the shift. He has had no complaints of pain. He continues to ambulate independently. VSS. Fall precautions in place. Will continue to monitor.  Problem: Education: Goal: Knowledge of General Education information will improve Description: Including pain rating scale, medication(s)/side effects and non-pharmacologic comfort measures Outcome: Progressing   Problem: Health Behavior/Discharge Planning: Goal: Ability to manage health-related needs will improve Outcome: Progressing   Problem: Clinical Measurements: Goal: Ability to maintain clinical measurements within normal limits will improve Outcome: Progressing Goal: Will remain free from infection Outcome: Progressing Goal: Diagnostic test results will improve Outcome: Progressing Goal: Respiratory complications will improve Outcome: Progressing Goal: Cardiovascular complication will be avoided Outcome: Progressing   Problem: Activity: Goal: Risk for activity intolerance will decrease Outcome: Progressing   Problem: Nutrition: Goal: Adequate nutrition will be maintained Outcome: Progressing   Problem: Coping: Goal: Level of anxiety will decrease Outcome: Progressing   Problem: Elimination: Goal: Will not experience complications related to bowel motility Outcome: Progressing Goal: Will not experience complications related to urinary retention Outcome: Progressing   Problem: Pain Management: Goal: General experience of comfort will improve Outcome: Progressing   Problem: Safety: Goal: Ability to remain free from injury will improve Outcome: Progressing   Problem: Skin Integrity: Goal: Risk for impaired skin integrity will decrease Outcome: Progressing   Problem: Activity: Goal: Ability to tolerate increased activity will improve Outcome: Progressing   Problem: Clinical Measurements: Goal: Ability to maintain a body temperature in the  normal range will improve Outcome: Progressing   Problem: Respiratory: Goal: Ability to maintain adequate ventilation will improve Outcome: Progressing Goal: Ability to maintain a clear airway will improve Outcome: Progressing

## 2023-04-30 NOTE — Progress Notes (Addendum)
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  Patient seen in the ED noted to be tachycardic, tachypneic with sats of 91% on room air, chest x-ray done concerning for multifocal pneumonia.  Patient placed on empiric IV antibiotics for community-acquired pneumonia, IV steroids, nebulizer treatments.  Pulmonary consulted.   Assessment & Plan:   Principal Problem:   Eosinophilic pneumonia (HCC) Active Problems:   Pneumonia of both lungs due to infectious organism   Elevated bilirubin   Acidosis, metabolic  #1 recurrent eosinophilic multifocal pneumonia, POA -Patient presenting from pulmonary office with progressive worsening cough, shortness of breath, wheezing. -Chest x-ray done concerning for multifocal infiltrates suggestive of multifocal pneumonia. -Patient noted to have received IV Rocephin and oral doxycycline in the ED and placed on IV Rocephin and IV azithromycin. -Sputum Gram stain and cultures pending. -Urine strep pneumococcus antigen negative.   -Urine Legionella antigen pending.   -Respiratory viral panel negative.  -Repeat chest x-ray with improvement. -Patient improving slowly. -Continue Pulmicort, scheduled DuoNebs, Claritin, Pepcid. -Continue Mucinex twice daily, Flonase twice daily, PPI. -Continue IV Solu-Medrol and likely will need a slow steroid taper per pulmonary.. -Patient seen in  consultation by pulmonary and Breo Ellipta discontinued patient started on Brovana. -Patient also started on yupelri and Bactrim DS Monday Wednesday Friday per pulmonary. -Pulmonary following and appreciate input and recommendations.  2.  Asthma exacerbation -Secondary to problem #1. -Continue treatment as in problem #1. -Ambulatory sats pending.  3.  Mild anion gap metabolic acidosis -In the setting of asthma exacerbation and pneumonia. -Patient noted to have a bicarb of 21 on presentation with a anion gap of 14 and lactic acid level of 1.2. -Acidosis improving anion gap closed currently at 10. -Continue treatment as in problem #1.  4.  Isolated elevated total bilirubin -Resolved.   DVT prophylaxis: Lovenox Code Status: Full Family Communication: Updated patient.  No family at bedside. Disposition: Home when clinically improved and cleared by pulmonary.  Status is: Inpatient Remains inpatient appropriate because: Severity of illness   Consultants:  Pulmonary: Anders Simmonds, NP/Dr. Isaiah Serge 04/29/2023  Procedures:  Chest x-ray 04/27/2023, 04/30/2023   Antimicrobials:  Anti-infectives (From admission, onward)    Start     Dose/Rate Route Frequency Ordered Stop   04/29/23 1245  sulfamethoxazole-trimethoprim (BACTRIM DS) 800-160 MG per tablet 1 tablet        1 tablet Oral Once per day on Monday Wednesday Friday 04/29/23 1155     04/28/23 1000  cefTRIAXone (ROCEPHIN) 2 g in sodium chloride 0.9 % 100 mL IVPB        2 g 200 mL/hr over 30 Minutes Intravenous Every 24 hours 04/27/23 2010     04/27/23 2200  azithromycin (ZITHROMAX) 500 mg in dextrose 5 % 250 mL IVPB        500 mg 250 mL/hr over 60 Minutes Intravenous Every 24 hours 04/27/23 2010     04/27/23 1730  cefTRIAXone (ROCEPHIN) 1 g in sodium chloride 0.9 % 100 mL IVPB  1 g 200 mL/hr over 30 Minutes Intravenous  Once 04/27/23 1726 04/27/23 1910   04/27/23 1730  doxycycline (VIBRA-TABS) tablet 100 mg        100 mg Oral   Once 04/27/23 1726 04/27/23 1748         Subjective: Patient just coming from the bathroom.  States he feels slightly better than he did yesterday.  Still with shortness of breath on exertion.  Feels slight improvement with wheezing.  Denies any chest pain or abdominal pain.   Objective: Vitals:   04/29/23 1945 04/29/23 2000 04/30/23 0515 04/30/23 0738  BP: 129/73  101/61   Pulse: 78  67   Resp: 18  19   Temp: 98.1 F (36.7 C)  98.7 F (37.1 C)   TempSrc: Oral  Oral   SpO2: 96% 97% 98% 96%  Weight:      Height:       No intake or output data in the 24 hours ending 04/30/23 1212  Filed Weights   04/27/23 1629 04/27/23 1920  Weight: 81 kg 81 kg    Examination:  General exam: NAD Respiratory system: Scattered coarse breath sounds.  Decreased expiratory wheezing.  No crackles.  Fair air movement.  Speaking in full sentences.  Cardiovascular system: RRR no murmurs rubs or gallops.  No JVD.  No lower extremity edema.  Gastrointestinal system: Abdomen is soft, nontender, nondistended, positive bowel sounds.  No rebound.  No guarding. Central nervous system: Alert and oriented. No focal neurological deficits. Extremities: Symmetric 5 x 5 power. Skin: No rashes, lesions or ulcers Psychiatry: Judgement and insight appear normal. Mood & affect appropriate.     Data Reviewed: I have personally reviewed following labs and imaging studies  CBC: Recent Labs  Lab 04/27/23 1646 04/28/23 0533 04/29/23 0505 04/30/23 0533  WBC 35.4* 13.8* 17.6* 12.2*  NEUTROABS 8.4* 8.8*  --   --   HGB 17.8* 15.8 14.7 14.4  HCT 49.3 46.8 42.0 43.1  MCV 86.6 90.9 90.3 92.7  PLT 600* 441* 477* 476*    Basic Metabolic Panel: Recent Labs  Lab 04/27/23 1646 04/28/23 0533 04/29/23 0505 04/30/23 0533  NA 135 138 135 137  K 3.7 4.5 4.2 4.5  CL 100 104 104 103  CO2 21* 20* 22 24  GLUCOSE 103* 152* 136* 141*  BUN 10 10 15 16   CREATININE 1.19 0.84 0.94 0.90  CALCIUM 8.7* 8.7* 8.2* 8.4*   MG  --  2.5*  --  2.5*  PHOS  --  4.8*  --   --     GFR: Estimated Creatinine Clearance: 109.1 mL/min (by C-G formula based on SCr of 0.9 mg/dL).  Liver Function Tests: Recent Labs  Lab 04/27/23 1646 04/29/23 0505  AST 16 13*  ALT 17 20  ALKPHOS 109 72  BILITOT 1.7* 0.4  PROT 7.8 6.5  ALBUMIN 3.7 3.1*    CBG: No results for input(s): "GLUCAP" in the last 168 hours.   Recent Results (from the past 240 hour(s))  Expectorated Sputum Assessment w Gram Stain, Rflx to Resp Cult     Status: None   Collection Time: 04/27/23  1:10 AM   Specimen: Expectorated Sputum  Result Value Ref Range Status   Specimen Description EXPECTORATED SPUTUM  Final   Special Requests NONE  Final   Sputum evaluation   Final    Sputum specimen not acceptable for testing.  Please recollect.   INFORMED ROGER RN OF RECOLLECT @ 0239 ON 04/28/2023 BY ABDULHALIM,M Performed  at [facility], [address], [place], [place] [address]    Report Status 04/28/2023 FINAL  Final  Culture, blood (routine x 2)     Status: None (Preliminary result)   Collection Time: 04/27/23  5:02 PM   Specimen: BLOOD  Result Value Ref Range Status   Specimen Description   Final    BLOOD RIGHT ANTECUBITAL Performed at [facility], [address] [place], [place] [address]    Special Requests   Final    BOTTLES DRAWN AEROBIC AND ANAEROBIC Blood Culture adequate volume Performed at [facility], [address] [place], [place] [address]    Culture   Final    NO GROWTH 3 DAYS Performed at [facility], [address], [place], [place] [address]    Report Status PENDING  Incomplete  Culture, blood (routine x 2)     Status: None (Preliminary result)   Collection Time: 04/27/23  5:07 PM   Specimen: BLOOD RIGHT FOREARM  Result Value Ref Range Status   Specimen Description   Final    BLOOD RIGHT FOREARM Performed at [facility], [address] [place], [place]  [address]    Special Requests   Final    BOTTLES DRAWN AEROBIC AND ANAEROBIC Blood Culture adequate volume Performed at [facility], [address] [place], [place] [address]    Culture   Final    NO GROWTH 3 DAYS Performed at [facility], [address], [place], [place] [address]    Report Status PENDING  Incomplete  Expectorated Sputum Assessment w Gram Stain, Rflx to Resp Cult     Status: None   Collection Time: 04/28/23  5:49 AM  Result Value Ref Range Status   Specimen Description EXPECTORATED SPUTUM  Final   Special Requests NONE  Final   Sputum evaluation   Final    THIS SPECIMEN IS ACCEPTABLE FOR SPUTUM CULTURE Performed at [facility], [address] [place], [place] [address]    Report Status 04/28/2023 FINAL  Final  Culture, Respiratory w Gram Stain     Status: None (Preliminary result)   Collection Time: 04/28/23  5:49 AM  Result Value Ref Range Status   Specimen Description   Final    EXPECTORATED SPUTUM Performed at [facility], [address], [place], [place] [address]    Special Requests   Final    NONE Reflexed from [phone] [id number] Performed at [facility], [address] [place], [place] [address]    Gram Stain   Final    RARE WBC PRESENT, PREDOMINANTLY PMN RARE GRAM NEGATIVE RODS    Culture   Final    TOO YOUNG TO READ Performed at [facility], [address], [place], [place] [address]    Report Status PENDING  Incomplete  Respiratory (~20 pathogens) panel by PCR     Status: None   Collection Time: 04/29/23  1:03 PM   Specimen: Nasopharyngeal Swab; Respiratory  Result Value Ref Range Status   Adenovirus NOT DETECTED NOT DETECTED Final   Coronavirus 229E NOT DETECTED NOT DETECTED Final    Comment: (NOTE) The Coronavirus on the Respiratory Panel, DOES NOT test for the novel  Coronavirus (2019 nCoV)    Coronavirus HKU1 NOT DETECTED NOT DETECTED Final   Coronavirus NL63  NOT DETECTED NOT DETECTED Final   Coronavirus OC43 NOT DETECTED NOT DETECTED Final  Metapneumovirus NOT DETECTED NOT DETECTED Final   Rhinovirus / Enterovirus NOT DETECTED NOT DETECTED Final   Influenza A NOT DETECTED NOT DETECTED Final   Influenza B NOT DETECTED NOT DETECTED Final   Parainfluenza Virus 1 NOT DETECTED NOT DETECTED Final   Parainfluenza Virus 2 NOT DETECTED NOT DETECTED Final   Parainfluenza Virus 3 NOT DETECTED NOT DETECTED Final   Parainfluenza Virus 4 NOT DETECTED NOT DETECTED Final   Respiratory Syncytial Virus NOT DETECTED NOT DETECTED Final   Bordetella pertussis NOT DETECTED NOT DETECTED Final   Bordetella Parapertussis NOT DETECTED NOT DETECTED Final   Chlamydophila pneumoniae NOT DETECTED NOT DETECTED Final   Mycoplasma pneumoniae NOT DETECTED NOT DETECTED Final    Comment: Performed at [facility], [address] [place], [place] [address]         Radiology Studies: No results found.      Scheduled Meds:  arformoterol  15 mcg Nebulization BID   budesonide (PULMICORT) nebulizer solution  0.5 mg Nebulization BID   enoxaparin (LOVENOX) injection  40 mg Subcutaneous Q24H   famotidine  20 mg Oral BID   fluticasone  2 spray Each Nare BID   guaiFENesin  1,200 mg Oral BID   ipratropium-albuterol  3 mL Nebulization TID   loratadine  10 mg Oral Daily   methylPREDNISolone (SOLU-MEDROL) injection  40 mg Intravenous BID   pantoprazole  40 mg Oral BID   revefenacin  175 mcg Nebulization Daily   sulfamethoxazole-trimethoprim  1 tablet Oral Once per day on Monday Wednesday Friday   Continuous Infusions:  azithromycin 500 mg (04/29/23 2158)   cefTRIAXone (ROCEPHIN)  IV 2 g (04/30/23 6578)     LOS: 3 days    Time spent: 40 minutes    Ramiro Harvest, MD [facility]   To contact the attending provider between 7A-7P or the covering provider during after hours 7P-7A, please log into the web site [email or web address] and access using universal  [place] password for that web site. If you do not have the password, please call the hospital operator.  04/30/2023, 12:12 PM

## 2023-05-01 ENCOUNTER — Telehealth: Payer: Self-pay | Admitting: Acute Care

## 2023-05-01 DIAGNOSIS — J189 Pneumonia, unspecified organism: Secondary | ICD-10-CM | POA: Diagnosis not present

## 2023-05-01 DIAGNOSIS — J8281 Chronic eosinophilic pneumonia: Secondary | ICD-10-CM | POA: Diagnosis not present

## 2023-05-01 DIAGNOSIS — J45901 Unspecified asthma with (acute) exacerbation: Secondary | ICD-10-CM | POA: Diagnosis not present

## 2023-05-01 DIAGNOSIS — R17 Unspecified jaundice: Secondary | ICD-10-CM | POA: Diagnosis not present

## 2023-05-01 LAB — CBC WITH DIFFERENTIAL/PLATELET
Abs Immature Granulocytes: 0.05 10*3/uL (ref 0.00–0.07)
Basophils Absolute: 0 10*3/uL (ref 0.0–0.1)
Basophils Relative: 0 %
Eosinophils Absolute: 0.1 10*3/uL (ref 0.0–0.5)
Eosinophils Relative: 1 %
HCT: 43.8 % (ref 39.0–52.0)
Hemoglobin: 14.5 g/dL (ref 13.0–17.0)
Immature Granulocytes: 1 %
Lymphocytes Relative: 17 %
Lymphs Abs: 1.6 10*3/uL (ref 0.7–4.0)
MCH: 30.9 pg (ref 26.0–34.0)
MCHC: 33.1 g/dL (ref 30.0–36.0)
MCV: 93.4 fL (ref 80.0–100.0)
Monocytes Absolute: 1 10*3/uL (ref 0.1–1.0)
Monocytes Relative: 10 %
Neutro Abs: 6.6 10*3/uL (ref 1.7–7.7)
Neutrophils Relative %: 71 %
Platelets: 469 10*3/uL — ABNORMAL HIGH (ref 150–400)
RBC: 4.69 MIL/uL (ref 4.22–5.81)
RDW: 12.1 % (ref 11.5–15.5)
WBC: 9.3 10*3/uL (ref 4.0–10.5)
nRBC: 0 % (ref 0.0–0.2)

## 2023-05-01 LAB — BASIC METABOLIC PANEL
Anion gap: 9 (ref 5–15)
BUN: 13 mg/dL (ref 6–20)
CO2: 22 mmol/L (ref 22–32)
Calcium: 8.3 mg/dL — ABNORMAL LOW (ref 8.9–10.3)
Chloride: 105 mmol/L (ref 98–111)
Creatinine, Ser: 0.74 mg/dL (ref 0.61–1.24)
GFR, Estimated: >60 mL/min (ref >60)
Glucose, Bld: 139 mg/dL — ABNORMAL HIGH (ref 70–99)
Potassium: 4.4 mmol/L (ref 3.5–5.1)
Sodium: 136 mmol/L (ref 135–145)

## 2023-05-01 LAB — LEGIONELLA PNEUMOPHILA SEROGP 1 UR AG: L. pneumophila Serogp 1 Ur Ag: NEGATIVE

## 2023-05-01 MED ORDER — METHYLPREDNISOLONE SODIUM SUCC 125 MG IJ SOLR
60.0000 mg | Freq: Every day | INTRAMUSCULAR | Status: DC
  Administered 2023-05-01 – 2023-05-02 (×2): 60 mg via INTRAVENOUS
  Filled 2023-05-01 (×2): qty 2

## 2023-05-01 NOTE — Progress Notes (Signed)
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  Patient seen in the ED noted to be tachycardic, tachypneic with sats of 91% on room air, chest x-ray done concerning for multifocal pneumonia.  Patient placed on empiric IV antibiotics for community-acquired pneumonia, IV steroids, nebulizer treatments.  Pulmonary consulted.   Assessment & Plan:   Principal Problem:   Eosinophilic pneumonia (HCC) Active Problems:   Pneumonia of both lungs due to infectious organism   Elevated bilirubin   Acidosis, metabolic  #1 recurrent eosinophilic multifocal pneumonia, POA -Patient presenting from pulmonary office with progressive worsening cough, shortness of breath, wheezing. -Chest x-ray done concerning for multifocal infiltrates suggestive of multifocal pneumonia. -Patient noted to have received IV Rocephin and oral doxycycline in the ED and placed on IV Rocephin and IV azithromycin. -Sputum Gram stain and cultures pending. -Urine strep pneumococcus antigen negative.   -Urine Legionella antigen negative. -Respiratory viral panel negative.  -Repeat chest x-ray with improvement. -Patient improving slowly. -Continue Pulmicort, scheduled DuoNebs, Claritin, Pepcid. -Continue Mucinex twice daily, Flonase twice daily, PPI twice daily. -Continue IV Solu-Medrol at 60 mg daily and likely will need a slow steroid taper per  pulmonary.. -Patient seen in consultation by pulmonary and Breo Ellipta discontinued patient started on Brovana. -Patient also started on yupelri and Bactrim DS Monday Wednesday Friday per pulmonary. -Will discontinue IV Rocephin and azithromycin after today's doses as patient would have completed 5-day course. -Pulmonary following and appreciate input and recommendations. -Will need close outpatient follow-up with pulmonary.  2.  Asthma exacerbation -Secondary to problem #1. -Continue treatment as in problem #1. -Ambulatory sats pending.  3.  Mild anion gap metabolic acidosis -In the setting of asthma exacerbation and pneumonia. -Patient noted to have a bicarb of 21 on presentation with a anion gap of 14 and lactic acid level of 1.2. -Acidosis improving anion gap closed currently at 9. -Continue treatment as in problem #1.  4.  Isolated elevated total bilirubin -Resolved.   DVT prophylaxis: Lovenox Code Status: Full Family Communication: Updated patient.  Disposition: Home when clinically improved and cleared by pulmonary hopefully in the next 24 to 48 hours..  Status is: Inpatient Remains inpatient appropriate because: Severity of illness   Consultants:  Pulmonary: Anders Simmonds, NP/Dr. Isaiah Serge 04/29/2023  Procedures:  Chest x-ray 04/27/2023, 04/30/2023   Antimicrobials:  Anti-infectives (From admission, onward)    Start     Dose/Rate Route Frequency Ordered Stop   04/29/23 1245  sulfamethoxazole-trimethoprim (BACTRIM DS) 800-160 MG per tablet 1 tablet        1 tablet Oral Once per day on Monday Wednesday Friday 04/29/23 1155     04/28/23 1000  cefTRIAXone (ROCEPHIN) 2 g in sodium chloride 0.9 % 100 mL IVPB        2 g 200 mL/hr over 30 Minutes Intravenous Every 24 hours 04/27/23 2010     04/27/23 2200  azithromycin (ZITHROMAX) 500 mg in dextrose 5 % 250 mL IVPB        500  mg 250 mL/hr over 60 Minutes Intravenous Every 24 hours 04/27/23 2010     04/27/23 1730  cefTRIAXone  (ROCEPHIN) 1 g in sodium chloride 0.9 % 100 mL IVPB        1 g 200 mL/hr over 30 Minutes Intravenous  Once 04/27/23 1726 04/27/23 1910   04/27/23 1730  doxycycline (VIBRA-TABS) tablet 100 mg        100 mg Oral  Once 04/27/23 1726 04/27/23 1748         Subjective: Sitting up in bed, friend at bedside.  Denies any chest pain.  Feels shortness of breath is improving significantly over the past 24 hours as well as wheezing and cough.  States not as short of breath on minimal exertion as he was yesterday when going to the bathroom.  Tolerating current diet.   Objective: Vitals:   04/30/23 1250 04/30/23 1953 05/01/23 0604 05/01/23 0748  BP: 120/73 109/75 (!) 114/58   Pulse: 86 64 66   Resp: 16 16 18    Temp: 98.6 F (37 C) 98.8 F (37.1 C) 98.1 F (36.7 C)   TempSrc: Oral Oral    SpO2: 95% 97% 94% 98%  Weight:      Height:        Intake/Output Summary (Last 24 hours) at 05/01/2023 1218 Last data filed at 05/01/2023 0500 Gross per 24 hour  Intake 605 ml  Output --  Net 605 ml    Filed Weights   04/27/23 1629 04/27/23 1920  Weight: 81 kg 81 kg    Examination:  General exam: NAD Respiratory system: Scattered coarse breath sounds.  Decreasing expiratory wheezing.  Improved air movement.  No crackles.  Speaking in full sentences.   Cardiovascular system: Regular rate rhythm no murmurs rubs or gallops.  No JVD.  No lower extremity edema.  Gastrointestinal system: Abdomen soft, nontender, nondistended, positive bowel sounds.  No rebound.  No guarding.   Central nervous system: Alert and oriented. No focal neurological deficits. Extremities: Symmetric 5 x 5 power. Skin: No rashes, lesions or ulcers Psychiatry: Judgement and insight appear normal. Mood & affect appropriate.     Data Reviewed: I have personally reviewed following labs and imaging studies  CBC: Recent Labs  Lab 04/27/23 1646 04/28/23 0533 04/29/23 0505 04/30/23 0533 05/01/23 0552  WBC 35.4* 13.8* 17.6*  12.2* 9.3  NEUTROABS 8.4* 8.8*  --   --  6.6  HGB 17.8* 15.8 14.7 14.4 14.5  HCT 49.3 46.8 42.0 43.1 43.8  MCV 86.6 90.9 90.3 92.7 93.4  PLT 600* 441* 477* 476* 469*    Basic Metabolic Panel: Recent Labs  Lab 04/27/23 1646 04/28/23 0533 04/29/23 0505 04/30/23 0533 05/01/23 0552  NA 135 138 135 137 136  K 3.7 4.5 4.2 4.5 4.4  CL 100 104 104 103 105  CO2 21* 20* 22 24 22   GLUCOSE 103* 152* 136* 141* 139*  BUN 10 10 15 16 13   CREATININE 1.19 0.84 0.94 0.90 0.74  CALCIUM 8.7* 8.7* 8.2* 8.4* 8.3*  MG  --  2.5*  --  2.5*  --   PHOS  --  4.8*  --   --   --     GFR: Estimated Creatinine Clearance: 122.7 mL/min (by C-G formula based on SCr of 0.74 mg/dL).  Liver Function Tests: Recent Labs  Lab 04/27/23 1646 04/29/23 0505  AST 16 13*  ALT 17 20  ALKPHOS 109 72  BILITOT 1.7* 0.4  PROT 7.8 6.5  ALBUMIN 3.7 3.1*  CBG: No results for input(s): "GLUCAP" in the last 168 hours.   Recent Results (from the past 240 hour(s))  Expectorated Sputum Assessment w Gram Stain, Rflx to Resp Cult     Status: None   Collection Time: 04/27/23  1:10 AM   Specimen: Expectorated Sputum  Result Value Ref Range Status   Specimen Description EXPECTORATED SPUTUM  Final   Special Requests NONE  Final   Sputum evaluation   Final    Sputum specimen not acceptable for testing.  Please recollect.   INFORMED ROGER RN OF RECOLLECT @ 0239 ON 04/28/2023 BY ABDULHALIM,M Performed at [facility], [address], [place], [place] [address]    Report Status 04/28/2023 FINAL  Final  Culture, blood (routine x 2)     Status: None (Preliminary result)   Collection Time: 04/27/23  5:02 PM   Specimen: BLOOD  Result Value Ref Range Status   Specimen Description   Final    BLOOD RIGHT ANTECUBITAL Performed at [facility], [address] [place], [place] [address]    Special Requests   Final    BOTTLES DRAWN AEROBIC AND ANAEROBIC Blood Culture adequate volume Performed  at [facility], [address] [place], [place] [address]    Culture   Final    NO GROWTH 4 DAYS Performed at [facility], [address], [place], [place] [address]    Report Status PENDING  Incomplete  Culture, blood (routine x 2)     Status: None (Preliminary result)   Collection Time: 04/27/23  5:07 PM   Specimen: BLOOD RIGHT FOREARM  Result Value Ref Range Status   Specimen Description   Final    BLOOD RIGHT FOREARM Performed at [facility], [address] [place], [place] [address]    Special Requests   Final    BOTTLES DRAWN AEROBIC AND ANAEROBIC Blood Culture adequate volume Performed at [facility], [address] [place], [place] [address]    Culture   Final    NO GROWTH 4 DAYS Performed at [facility], [address], [place], [place] [address]    Report Status PENDING  Incomplete  Expectorated Sputum Assessment w Gram Stain, Rflx to Resp Cult     Status: None   Collection Time: 04/28/23  5:49 AM  Result Value Ref Range Status   Specimen Description EXPECTORATED SPUTUM  Final   Special Requests NONE  Final   Sputum evaluation   Final    THIS SPECIMEN IS ACCEPTABLE FOR SPUTUM CULTURE Performed at [facility], [address], [place], [place] [address]    Report Status 04/28/2023 FINAL  Final  Culture, Respiratory w Gram Stain     Status: None (Preliminary result)   Collection Time: 04/28/23  5:49 AM  Result Value Ref Range Status   Specimen Description   Final    EXPECTORATED SPUTUM Performed at [facility], [address], [place], [place] [address]    Special Requests   Final    NONE Reflexed from [id number] Performed at [facility], [address], [place], [place] [address]    Gram Stain   Final    RARE WBC PRESENT, PREDOMINANTLY PMN RARE GRAM NEGATIVE RODS    Culture   Final    CULTURE REINCUBATED FOR BETTER GROWTH Performed at [facility], [address], [place], [place] [address]  Report Status PENDING  Incomplete  Respiratory (~20 pathogens) panel by PCR     Status: None   Collection Time: 04/29/23  1:03 PM   Specimen: Nasopharyngeal Swab; Respiratory  Result Value Ref Range Status   Adenovirus NOT DETECTED NOT DETECTED Final   Coronavirus 229E NOT DETECTED NOT DETECTED Final    Comment: (NOTE) The Coronavirus on the Respiratory Panel, DOES NOT test for the novel  Coronavirus (2019 nCoV)    Coronavirus HKU1 NOT DETECTED NOT DETECTED Final   Coronavirus NL63 NOT DETECTED NOT DETECTED Final   Coronavirus OC43 NOT DETECTED NOT DETECTED Final   Metapneumovirus NOT DETECTED NOT DETECTED Final   Rhinovirus / Enterovirus NOT DETECTED NOT DETECTED Final   Influenza A NOT DETECTED NOT DETECTED Final   Influenza B NOT DETECTED NOT DETECTED Final   Parainfluenza Virus 1 NOT DETECTED NOT DETECTED Final   Parainfluenza Virus 2 NOT DETECTED NOT DETECTED Final   Parainfluenza Virus 3 NOT DETECTED NOT DETECTED Final   Parainfluenza Virus 4 NOT DETECTED NOT DETECTED Final   Respiratory Syncytial Virus NOT DETECTED NOT DETECTED Final   Bordetella pertussis NOT DETECTED NOT DETECTED Final   Bordetella Parapertussis NOT DETECTED NOT DETECTED Final   Chlamydophila pneumoniae NOT DETECTED NOT DETECTED Final   Mycoplasma pneumoniae NOT DETECTED NOT DETECTED Final    Comment: Performed at [facility], [address], [place], [place] [address]         Radiology Studies: DG Chest Port 1 View  Result Date: 04/30/2023 CLINICAL DATA:  Follow-up of pneumonia EXAM: PORTABLE CHEST 1 VIEW COMPARISON:  04/27/2023 FINDINGS: Midline trachea. Normal heart size and mediastinal contours. No pleural effusion or pneumothorax. Significant improvement in airspace disease within the left mid lung and right lower lobe laterally. No new pulmonary opacity. IMPRESSION: Improved multifocal airspace disease, most consistent with resolving pneumonia.  Electronically Signed   By: Jeronimo Greaves M.D.   On: 04/30/2023 12:32        Scheduled Meds:  arformoterol  15 mcg Nebulization BID   budesonide (PULMICORT) nebulizer solution  0.5 mg Nebulization BID   enoxaparin (LOVENOX) injection  40 mg Subcutaneous Q24H   famotidine  20 mg Oral BID   fluticasone  2 spray Each Nare BID   guaiFENesin  1,200 mg Oral BID   ipratropium-albuterol  3 mL Nebulization TID   loratadine  10 mg Oral Daily   methylPREDNISolone (SOLU-MEDROL) injection  60 mg Intravenous Daily   pantoprazole  40 mg Oral BID   revefenacin  175 mcg Nebulization Daily   sulfamethoxazole-trimethoprim  1 tablet Oral Once per day on Monday Wednesday Friday   Continuous Infusions:  azithromycin Stopped (04/30/23 2215)   cefTRIAXone (ROCEPHIN)  IV 2 g (05/01/23 0914)     LOS: 4 days    Time spent: 40 minutes    Ramiro Harvest, MD [facility]   To contact the attending provider between 7A-7P or the covering provider during after hours 7P-7A, please log into the web site [email or web address] and access using universal [place] password for that web site. If you do not have the password, please call the hospital operator.  05/01/2023, 12:18 PM

## 2023-05-01 NOTE — Plan of Care (Signed)
No acute events this shift. The patient has rested comfortably without complaints of pain. He continues to ambulate independently. VSS. Fall precautions in place. Will continue to monitor.  Problem: Education: Goal: Knowledge of General Education information will improve Description: Including pain rating scale, medication(s)/side effects and non-pharmacologic comfort measures Outcome: Progressing   Problem: Health Behavior/Discharge Planning: Goal: Ability to manage health-related needs will improve Outcome: Progressing   Problem: Clinical Measurements: Goal: Ability to maintain clinical measurements within normal limits will improve Outcome: Progressing Goal: Will remain free from infection Outcome: Progressing Goal: Diagnostic test results will improve Outcome: Progressing Goal: Respiratory complications will improve Outcome: Progressing Goal: Cardiovascular complication will be avoided Outcome: Progressing   Problem: Activity: Goal: Risk for activity intolerance will decrease Outcome: Progressing   Problem: Nutrition: Goal: Adequate nutrition will be maintained Outcome: Progressing   Problem: Coping: Goal: Level of anxiety will decrease Outcome: Progressing   Problem: Elimination: Goal: Will not experience complications related to bowel motility Outcome: Progressing Goal: Will not experience complications related to urinary retention Outcome: Progressing   Problem: Pain Management: Goal: General experience of comfort will improve Outcome: Progressing   Problem: Safety: Goal: Ability to remain free from injury will improve Outcome: Progressing   Problem: Skin Integrity: Goal: Risk for impaired skin integrity will decrease Outcome: Progressing   Problem: Activity: Goal: Ability to tolerate increased activity will improve Outcome: Progressing   Problem: Clinical Measurements: Goal: Ability to maintain a body temperature in the normal range will  improve Outcome: Progressing   Problem: Respiratory: Goal: Ability to maintain adequate ventilation will improve Outcome: Progressing Goal: Ability to maintain a clear airway will improve Outcome: Progressing

## 2023-05-01 NOTE — Plan of Care (Signed)

## 2023-05-02 DIAGNOSIS — J189 Pneumonia, unspecified organism: Secondary | ICD-10-CM | POA: Diagnosis not present

## 2023-05-02 DIAGNOSIS — J4530 Mild persistent asthma, uncomplicated: Secondary | ICD-10-CM

## 2023-05-02 DIAGNOSIS — E872 Acidosis, unspecified: Secondary | ICD-10-CM | POA: Diagnosis not present

## 2023-05-02 DIAGNOSIS — J8281 Chronic eosinophilic pneumonia: Secondary | ICD-10-CM | POA: Diagnosis not present

## 2023-05-02 DIAGNOSIS — R17 Unspecified jaundice: Secondary | ICD-10-CM | POA: Diagnosis not present

## 2023-05-02 LAB — BASIC METABOLIC PANEL
Anion gap: 10 (ref 5–15)
BUN: 14 mg/dL (ref 6–20)
CO2: 24 mmol/L (ref 22–32)
Calcium: 8.3 mg/dL — ABNORMAL LOW (ref 8.9–10.3)
Chloride: 103 mmol/L (ref 98–111)
Creatinine, Ser: 0.81 mg/dL (ref 0.61–1.24)
GFR, Estimated: >60 mL/min (ref >60)
Glucose, Bld: 94 mg/dL (ref 70–99)
Potassium: 3.8 mmol/L (ref 3.5–5.1)
Sodium: 137 mmol/L (ref 135–145)

## 2023-05-02 LAB — CBC
HCT: 43.7 % (ref 39.0–52.0)
Hemoglobin: 14.8 g/dL (ref 13.0–17.0)
MCH: 30.8 pg (ref 26.0–34.0)
MCHC: 33.9 g/dL (ref 30.0–36.0)
MCV: 90.9 fL (ref 80.0–100.0)
Platelets: 480 10*3/uL — ABNORMAL HIGH (ref 150–400)
RBC: 4.81 MIL/uL (ref 4.22–5.81)
RDW: 12 % (ref 11.5–15.5)
WBC: 10.4 10*3/uL (ref 4.0–10.5)
nRBC: 0 % (ref 0.0–0.2)

## 2023-05-02 LAB — CULTURE, RESPIRATORY W GRAM STAIN: Culture: NORMAL

## 2023-05-02 LAB — CULTURE, BLOOD (ROUTINE X 2)
Culture: NO GROWTH
Culture: NO GROWTH
Special Requests: ADEQUATE
Special Requests: ADEQUATE

## 2023-05-02 LAB — MAGNESIUM: Magnesium: 2.3 mg/dL (ref 1.7–2.4)

## 2023-05-02 MED ORDER — GUAIFENESIN ER 600 MG PO TB12: 1200.0000 mg | ORAL_TABLET | Freq: Two times a day (BID) | ORAL | 0 refills | Status: AC

## 2023-05-02 MED ORDER — IPRATROPIUM-ALBUTEROL 0.5-2.5 (3) MG/3ML IN SOLN: 3.0000 mL | Freq: Three times a day (TID) | RESPIRATORY_TRACT | 0 refills | Status: AC

## 2023-05-02 MED ORDER — PREDNISONE 10 MG PO TABS: ORAL_TABLET | ORAL | 0 refills | Status: DC

## 2023-05-02 MED ORDER — PANTOPRAZOLE SODIUM 40 MG PO TBEC: 40.0000 mg | DELAYED_RELEASE_TABLET | Freq: Two times a day (BID) | ORAL | 1 refills | Status: AC

## 2023-05-02 MED ORDER — LEVALBUTEROL TARTRATE 45 MCG/ACT IN AERO: 2.0000 | INHALATION_SPRAY | Freq: Four times a day (QID) | RESPIRATORY_TRACT | 0 refills | Status: AC | PRN

## 2023-05-02 MED ORDER — SULFAMETHOXAZOLE-TRIMETHOPRIM 800-160 MG PO TABS: 1.0000 | ORAL_TABLET | ORAL | 1 refills | Status: DC

## 2023-05-02 MED ORDER — HYDROCODONE BIT-HOMATROP MBR 5-1.5 MG/5ML PO SOLN: 5.0000 mL | Freq: Four times a day (QID) | ORAL | 0 refills | Status: DC | PRN

## 2023-05-02 MED ORDER — CETIRIZINE HCL 10 MG PO TABS: 10.0000 mg | ORAL_TABLET | Freq: Every day | ORAL | 1 refills | Status: AC

## 2023-05-02 NOTE — Progress Notes (Signed)
SATURATION QUALIFICATIONS: (This note is used to comply with regulatory documentation for home oxygen)  Patient Saturations on Room Air at Rest = 97%  Patient Saturations on Room Air while Ambulating = 91-94%  Patient Saturations on 0 Liters of oxygen while Ambulating = 91-94%  Please briefly explain why patient needs home oxygen: Patient able to ambulate in hallway without oxygen needs. Maintained oxygen saturation above 90% on room air.

## 2023-05-02 NOTE — Discharge Summary (Signed)
Physician Discharge Summary  Tracy Pugh BMW:[id number] DOB: 1980/07/17 DOA: 04/27/2023  PCP: Patient, No Pcp Per  Admit date: 04/27/2023 Discharge date: 05/02/2023  Time spent: 60 minutes  Recommendations for Outpatient Follow-up:  Follow-up with Dr. Judeth Horn, pulmonary in 2 to 3 weeks or as previously scheduled.   Discharge Diagnoses:  Principal Problem:   Eosinophilic pneumonia (HCC) Active Problems:   Pneumonia of both lungs due to infectious organism   Elevated bilirubin   Acidosis, metabolic   Discharge Condition: Stable and improved.  Diet recommendation: Regular  Filed Weights   04/27/23 1629 04/27/23 1920  Weight: 81 kg 81 kg    History of present illness:  HPI per Dr. Jasper Loser Pugh is a 42 y.o. 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  Denies any fevers or chills.   He initially presented to his pulmonologist today.  Given his audible wheezing and increased work of breathing, the patient was advised to go to the ER for further evaluation and management.   In the ED, tachycardic and tachypneic with O2 saturation of 91% on room air.  Chest x-ray revealed multifocal pneumonia.  The patient was started on empiric IV antibiotics for community-acquired pneumonia.  The patient also received 1 dose of IV Solu-Medrol 125 x 1 for asthma exacerbation at [facility] ED.  [facility], hospitalist service, was asked to admit.  Accepted by Dr. Kirke Corin, [facility], and transferred to [facility] Med[facility]urg unit as inpatient status.   At the time of this visit, the patient  still has audible wheezing and a persistent cough.  Sputum culture, bronchodilators, and IV steroids, ordered.     ED Course: Temperature 98.5.  BP 123/79, pulse 96, respiration rate 17, saturation 92% on room air.  Lab studies notable for WBC 35.4, hemoglobin 17.8, platelet count 600.  Serum bicarb 21, glucose 103.  T. bili 1.7.  Lactic acid 1.2.  Hospital Course:  #1 recurrent eosinophilic multifocal pneumonia, POA -Patient presented from pulmonary office with progressive worsening cough, shortness of breath, wheezing. -Chest x-ray done concerning for multifocal infiltrates suggestive of multifocal pneumonia. -Patient noted to have received IV Rocephin and oral doxycycline in the ED and placed on IV Rocephin and IV azithromycin. -Sputum Gram stain and cultures obtained with normal respiratory flora noted.  No Staph aureus or Pseudomonas was seen.  -Urine strep pneumococcus antigen negative.   -Urine Legionella antigen negative.   -Respiratory viral panel negative.  -Repeat chest x-ray with improvement. -Patient improved slowly during the hospitalization.   -Patient was placed on Pulmicort, scheduled DuoNebs, Claritin, Pepcid, PPI, Flonase, Mucinex, IV Solu-Medrol. -Patient seen in consultation by pulmonary and Breo Ellipta discontinued patient started on Brovana, Yupelri and Bactrim DS Monday Wednesday Friday per pulmonary. -Slow steroid taper and patient will be discharged on prednisone taper at 60 mg daily with decreasing dose of 10 mg/week until patient is at prednisone 20 mg daily with close outpatient follow-up with pulmonary for further recommendations. -It is noted per pulmonary that patient just restarted Nucala on 11/ 09/2022 as an outpatient which patient will resume on discharge. -Patient underwent ambulatory sats on day of discharge with sats of 97% on room air at  rest, sats of 91 to 94% on room air with ambulation. -Patient will be discharged in stable and improved condition.    2.  Asthma exacerbation -Secondary to problem #1. -Improved with treatment and problem #1.   -Patient underwent ambulatory sats on day of discharge with sats of 97% on room air at rest, sats of 91 to 94% on room air with ambulation.   -Patient discharged on a slow steroid taper, nebulizer treatments, PPI.  -Outpatient follow-up with pulmonary.    3.  Mild anion gap metabolic acidosis -In the setting of asthma exacerbation and pneumonia. -Patient noted to have a bicarb of 21 on presentation with a anion gap of 14 and lactic acid level of 1.2. -Acidosis improved and anion gap improved and had resolved by day of discharge.     4.  Isolated elevated total bilirubin -Resolved.  Procedures: Chest x-ray 04/27/2023, 04/30/2023  Consultations: Pulmonary: Anders Simmonds, NP/Dr. Isaiah Serge 04/29/2023   Discharge Exam: Vitals:   05/02/23 1253 05/02/23 1412  BP: 111/70   Pulse: 72   Resp: 18   Temp: 98.4 F (36.9 C)   SpO2: 95% 95%    General: NAD Cardiovascular: RRR no murmurs rubs or gallops.  No JVD.  No pitting lower extremity edema. Respiratory: Scattered coarse breath sounds.  No crackles.  No significant wheezing noted.  Fair air movement.  Speaking in full sentences.  No use of accessory muscles of respiration.  Discharge Instructions   Discharge Instructions     Diet general   Complete by: As directed    Increase activity slowly   Complete by: As directed       Allergies as of 05/02/2023       Reactions   Vantin [cefpodoxime] Hives        Medication List     STOP taking these medications    albuterol 108 (90 Base) MCG/ACT inhaler Commonly known as: VENTOLIN HFA Replaced by: levalbuterol 45 MCG/ACT inhaler You also have another medication with the same name that you need to continue taking as instructed.   levofloxacin 500 MG tablet Commonly known as: LEVAQUIN   sucralfate 1 g tablet Commonly known as: Carafate       TAKE these medications    albuterol  (2.5 MG/3ML) 0.083% nebulizer solution Commonly known as: PROVENTIL Take 3 mLs (2.5 mg total) by nebulization every 6 (six) hours as needed for wheezing or shortness of breath. What changed: Another medication with the same name was removed. Continue taking this medication, and follow the directions you see here.   Breo Ellipta 200-25 MCG/ACT Aepb Generic drug: fluticasone furoate-vilanterol INHALE 1 PUFF DAILY INTO LUNGS   cetirizine 10 MG tablet Commonly known as: ZYRTEC Take 1 tablet (10 mg total) by mouth daily.   famotidine 20 MG tablet Commonly known as: PEPCID Take 1 tablet (20 mg total) by mouth 2 (two) times daily.   fluticasone 50 MCG/ACT nasal spray Commonly known as: Flonase Allergy Relief Place 1 spray into both nostrils 2 (two) times daily.   guaiFENesin 600 MG 12 hr tablet Commonly known as: MUCINEX Take 2 tablets (1,200 mg total) by mouth 2 (two) times daily for 7 days.   HYDROcodone bit-homatropine 5-1.5 MG/5ML syrup Commonly known as: HYCODAN Take 5 mLs by mouth every 6 (six) hours as needed for cough.   ipratropium-albuterol 0.5-2.5 (3) MG/3ML Soln Commonly known as: DUONEB Take 3 mLs by nebulization 3 (three) times daily for 10 days.   levalbuterol 45 MCG/ACT inhaler Commonly known  as: XOPENEX HFA Inhale 2 puffs into the lungs every 6 (six) hours as needed for wheezing. Replaces: albuterol 108 (90 Base) MCG/ACT inhaler   Nucala 100 MG/ML Soaj Generic drug: Mepolizumab Inject 1 mL (100 mg total) into the skin every 28 (twenty-eight) days.   pantoprazole 40 MG tablet Commonly known as: PROTONIX Take 1 tablet (40 mg total) by mouth 2 (two) times daily. What changed: when to take this   predniSONE 10 MG tablet Commonly known as: DELTASONE Take 6 tablets (60 mg total) by mouth daily for 7 days, THEN 5 tablets (50 mg total) daily for 7 days, THEN 4 tablets (40 mg total) daily for 7 days, THEN 3 tablets (30 mg total) daily for 7 days, THEN 2 tablets (20  mg total) daily. Start taking on: May 02, 2023   sodium chloride 0.65 % Soln nasal spray Commonly known as: OCEAN Place 1 spray into both nostrils as needed for congestion.   sulfamethoxazole-trimethoprim 800-160 MG tablet Commonly known as: BACTRIM DS Take 1 tablet by mouth 3 (three) times a week. Start taking on: May 04, 2023       Allergies  Allergen Reactions   Vantin [Cefpodoxime] Hives    Follow-up Information     Hunsucker, Lesia Sago, MD. Schedule an appointment as soon as possible for a visit in 2 week(s).   Specialty: Pulmonary Disease Why: f/u in 2-3 weeks or as scheduled. Contact information: [address] [place] [address] [phone]                  The results of significant diagnostics from this hospitalization (including imaging, microbiology, ancillary and laboratory) are listed below for reference.    Significant Diagnostic Studies: DG Chest Port 1 View  Result Date: 04/30/2023 CLINICAL DATA:  Follow-up of pneumonia EXAM: PORTABLE CHEST 1 VIEW COMPARISON:  04/27/2023 FINDINGS: Midline trachea. Normal heart size and mediastinal contours. No pleural effusion or pneumothorax. Significant improvement in airspace disease within the left mid lung and right lower lobe laterally. No new pulmonary opacity. IMPRESSION: Improved multifocal airspace disease, most consistent with resolving pneumonia. Electronically Signed   By: Jeronimo Greaves M.D.   On: 04/30/2023 12:32   DG Chest Portable 1 View  Result Date: 04/27/2023 CLINICAL DATA:  Shortness of breath, cough EXAM: PORTABLE CHEST 1 VIEW COMPARISON:  Radiograph 04/16/2023 and CT chest 04/19/2023 FINDINGS: Reticular and airspace opacities in the bilateral lower lungs are new since 04/16/2023. No pleural effusion or pneumothorax. Stable cardiomediastinal silhouette. No displaced rib fractures. IMPRESSION: Multifocal pneumonia, worsened compared with 04/19/2023. Follow-up to  resolution is recommended Electronically Signed   By: Minerva Fester M.D.   On: 04/27/2023 19:02   CT Chest Wo Contrast  Result Date: 04/19/2023 CLINICAL DATA:  Pneumonia, complication suspected, x-ray done. Pulmonary abnormality on same-day CT abdomen and pelvis. EXAM: CT CHEST WITHOUT CONTRAST TECHNIQUE: Multidetector CT imaging of the chest was performed following the standard protocol without IV contrast. RADIATION DOSE REDUCTION: This exam was performed according to the departmental dose-optimization program which includes automated exposure control, adjustment of the mA and/or kV according to patient size and/or use of iterative reconstruction technique. COMPARISON:  Radiograph 04/16/2023 and CT chest 11/16/2020 FINDINGS: Cardiovascular: Normal heart size. No pericardial effusion. Normal caliber thoracic aorta. Mediastinum/Nodes: Increased size of multiple mediastinal lymph nodes compared to 11/16/2020. For example a precarinal lymph node measures 1.0 cm in short axis on series 2/image 30, previously 0.7 cm. Esophagus is unremarkable. Lungs/Pleura: Multiple peripheral nodules  within the trachea and mainstem bronchi. Bronchial wall thickening in the left lower lobe. Centrilobular and tree-in-bud opacities in the lower lungs. Interlobular septal thickening in the lower lungs. 4.9 x 4.0 cm ground-glass opacity in the left lower lobe. No pleural effusion or pneumothorax. Upper Abdomen: See separate report from same day CT abdomen and pelvis. Musculoskeletal: No acute fracture. IMPRESSION: 1. Findings compatible with bronchopneumonia. This includes a 4.9 cm ground-glass and consolidative opacity in the left lower lobe. Follow-up after treatment is recommended to ensure resolution and exclude underlying malignancy. 2. Multiple peripheral nodules within the trachea and mainstem bronchi. Findings may represent adherent debris/secretions. Differential consideration includes tracheobronchial papillomatosis.  Continued attention on follow-up. 3. Increased size of multiple mediastinal lymph nodes compared to 11/16/2020. Attention on follow-up. Electronically Signed   By: Minerva Fester M.D.   On: 04/19/2023 02:39   CT ABDOMEN PELVIS W CONTRAST  Result Date: 04/19/2023 CLINICAL DATA:  Upper abdominal pain with nausea and vomiting EXAM: CT ABDOMEN AND PELVIS WITH CONTRAST TECHNIQUE: Multidetector CT imaging of the abdomen and pelvis was performed using the standard protocol following bolus administration of intravenous contrast. RADIATION DOSE REDUCTION: This exam was performed according to the departmental dose-optimization program which includes automated exposure control, adjustment of the mA and/or kV according to patient size and/or use of iterative reconstruction technique. CONTRAST:  OMNIPAQUE IOHEXOL 300 MG/ML  SOLN COMPARISON:  None Available. FINDINGS: Lower chest: Interlobular septal thickening in the right middle lobe. Bronchial wall thickening and centrilobular micro nodules and tree-in-bud opacities in the lower lungs. Patchy ground-glass opacities greatest in the left lower lobe. Hepatobiliary: Unremarkable liver. Normal gallbladder. No biliary dilation. Pancreas: Unremarkable. Spleen: Unremarkable. Adrenals/Urinary Tract: Normal adrenal glands. No urinary calculi or hydronephrosis. Bladder is unremarkable. Stomach/Bowel: Normal caliber large and small bowel. No bowel wall thickening. The appendix is normal.Stomach is within normal limits. Vascular/Lymphatic: No significant vascular findings are present. No enlarged abdominal or pelvic lymph nodes. Reproductive: Unremarkable. Other: No free intraperitoneal fluid or air. Musculoskeletal: No acute fracture. IMPRESSION: 1. No acute abnormality in the abdomen or pelvis. 2. Bronchopneumonia in the lower lungs. Electronically Signed   By: Minerva Fester M.D.   On: 04/19/2023 01:17   DG Chest Portable 1 View  Result Date: 04/16/2023 CLINICAL DATA:   Shortness of breath. EXAM: PORTABLE CHEST 1 VIEW COMPARISON:  Chest radiograph 11/06/2022. FINDINGS: Clear lungs. Normal heart size and mediastinal contours. No pleural effusion or pneumothorax. Visualized bones and upper abdomen are unremarkable. IMPRESSION: No evidence of acute cardiopulmonary disease. Electronically Signed   By: Orvan Falconer M.D.   On: 04/16/2023 08:16    Microbiology: Recent Results (from the past 240 hour(s))  Expectorated Sputum Assessment w Gram Stain, Rflx to Resp Cult     Status: None   Collection Time: 04/27/23  1:10 AM   Specimen: Expectorated Sputum  Result Value Ref Range Status   Specimen Description EXPECTORATED SPUTUM  Final   Special Requests NONE  Final   Sputum evaluation   Final    Sputum specimen not acceptable for testing.  Please recollect.   INFORMED ROGER RN OF RECOLLECT @ 0239 ON 04/28/2023 BY ABDULHALIM,M Performed at [facility], [address] [place], [place] [address]    Report Status 04/28/2023 FINAL  Final  Culture, blood (routine x 2)     Status: None   Collection Time: 04/27/23  5:02 PM   Specimen: BLOOD  Result Value Ref Range Status   Specimen Description   Final  BLOOD RIGHT ANTECUBITAL Performed at [facility], [address] [place], [place] [address]    Special Requests   Final    BOTTLES DRAWN AEROBIC AND ANAEROBIC Blood Culture adequate volume Performed at [facility], [address] [place], [place] [address]    Culture   Final    NO GROWTH 5 DAYS Performed at [facility], [address], [place], [place] [address]    Report Status 05/02/2023 FINAL  Final  Culture, blood (routine x 2)     Status: None   Collection Time: 04/27/23  5:07 PM   Specimen: BLOOD RIGHT FOREARM  Result Value Ref Range Status   Specimen Description   Final    BLOOD RIGHT FOREARM Performed at [facility], [address], [place], [place] [address]    Special Requests   Final     BOTTLES DRAWN AEROBIC AND ANAEROBIC Blood Culture adequate volume Performed at [facility], [address] [place], [place] [address]    Culture   Final    NO GROWTH 5 DAYS Performed at [facility], [address], [place], [place] [address]    Report Status 05/02/2023 FINAL  Final  Expectorated Sputum Assessment w Gram Stain, Rflx to Resp Cult     Status: None   Collection Time: 04/28/23  5:49 AM  Result Value Ref Range Status   Specimen Description EXPECTORATED SPUTUM  Final   Special Requests NONE  Final   Sputum evaluation   Final    THIS SPECIMEN IS ACCEPTABLE FOR SPUTUM CULTURE Performed at [facility], [address], [place], [place] [address]    Report Status 04/28/2023 FINAL  Final  Culture, Respiratory w Gram Stain     Status: None   Collection Time: 04/28/23  5:49 AM  Result Value Ref Range Status   Specimen Description   Final    EXPECTORATED SPUTUM Performed at [facility], [address] [place], [place] [address]    Special Requests   Final    NONE Reflexed from [id number] Performed at [facility], [address], [place], [place] [address]    Gram Stain   Final    RARE WBC PRESENT, PREDOMINANTLY PMN RARE GRAM NEGATIVE RODS    Culture   Final    Normal respiratory flora-no Staph aureus or Pseudomonas seen Performed at [facility], [address] [place], [place] [address]    Report Status 05/02/2023 FINAL  Final  Respiratory (~20 pathogens) panel by PCR     Status: None   Collection Time: 04/29/23  1:03 PM   Specimen: Nasopharyngeal Swab; Respiratory  Result Value Ref Range Status   Adenovirus NOT DETECTED NOT DETECTED Final   Coronavirus 229E NOT DETECTED NOT DETECTED Final    Comment: (NOTE) The Coronavirus on the Respiratory Panel, DOES NOT test for the novel  Coronavirus (2019 nCoV)    Coronavirus HKU1 NOT DETECTED NOT DETECTED Final   Coronavirus NL63 NOT DETECTED  NOT DETECTED Final   Coronavirus OC43 NOT DETECTED NOT DETECTED Final   Metapneumovirus NOT DETECTED NOT DETECTED Final   Rhinovirus / Enterovirus NOT DETECTED NOT DETECTED Final   Influenza A NOT DETECTED NOT DETECTED Final   Influenza B NOT DETECTED NOT DETECTED Final   Parainfluenza Virus 1 NOT DETECTED NOT DETECTED Final   Parainfluenza Virus 2 NOT DETECTED NOT DETECTED Final   Parainfluenza Virus  3 NOT DETECTED NOT DETECTED Final   Parainfluenza Virus 4 NOT DETECTED NOT DETECTED Final   Respiratory Syncytial Virus NOT DETECTED NOT DETECTED Final   Bordetella pertussis NOT DETECTED NOT DETECTED Final   Bordetella Parapertussis NOT DETECTED NOT DETECTED Final   Chlamydophila pneumoniae NOT DETECTED NOT DETECTED Final   Mycoplasma pneumoniae NOT DETECTED NOT DETECTED Final    Comment: Performed at [facility], [address] [place], [place] [address]     Labs: Basic Metabolic Panel: Recent Labs  Lab 04/28/23 0533 04/29/23 0505 04/30/23 0533 05/01/23 0552 05/02/23 0621  NA 138 135 137 136 137  K 4.5 4.2 4.5 4.4 3.8  CL 104 104 103 105 103  CO2 20* 22 24 22 24   GLUCOSE 152* 136* 141* 139* 94  BUN 10 15 16 13 14   CREATININE 0.84 0.94 0.90 0.74 0.81  CALCIUM 8.7* 8.2* 8.4* 8.3* 8.3*  MG 2.5*  --  2.5*  --  2.3  PHOS 4.8*  --   --   --   --    Liver Function Tests: Recent Labs  Lab 04/27/23 1646 04/29/23 0505  AST 16 13*  ALT 17 20  ALKPHOS 109 72  BILITOT 1.7* 0.4  PROT 7.8 6.5  ALBUMIN 3.7 3.1*   No results for input(s): "LIPASE", "AMYLASE" in the last 168 hours. No results for input(s): "AMMONIA" in the last 168 hours. CBC: Recent Labs  Lab 04/27/23 1646 04/28/23 0533 04/29/23 0505 04/30/23 0533 05/01/23 0552 05/02/23 0621  WBC 35.4* 13.8* 17.6* 12.2* 9.3 10.4  NEUTROABS 8.4* 8.8*  --   --  6.6  --   HGB 17.8* 15.8 14.7 14.4 14.5 14.8  HCT 49.3 46.8 42.0 43.1 43.8 43.7  MCV 86.6 90.9 90.3 92.7 93.4 90.9  PLT 600* 441* 477* 476* 469* 480*    Cardiac Enzymes: No results for input(s): "CKTOTAL", "CKMB", "CKMBINDEX", "TROPONINI" in the last 168 hours. BNP: BNP (last 3 results) No results for input(s): "BNP" in the last 8760 hours.  ProBNP (last 3 results) No results for input(s): "PROBNP" in the last 8760 hours.  CBG: No results for input(s): "GLUCAP" in the last 168 hours.     Signed:  Ramiro Harvest MD.  [facility] 05/02/2023, 3:03 PM

## 2023-05-14 ENCOUNTER — Telehealth: Payer: Self-pay | Admitting: Neonatal

## 2023-05-14 ENCOUNTER — Telehealth: Payer: Self-pay | Admitting: Pulmonary Disease

## 2023-05-14 NOTE — Telephone Encounter (Signed)
We received 2 faxes with the same disability form - one from [employer] and one from [employer].  Health Care Provider Statement to cover the patient's hospitalization from 11/4 to 05/02/2023.  I called patient and he stated he returned to work full time on 11/18 with no work restrictions.  He has a hospital follow-up appointment with B. Clent Ridges, NP on 06/15/23.  I filled out the form for the period 11/4 to 11/17 (end date of patient's time off from work) and will give to Dr. Judeth Horn to sign when he is in office 11/26.   Signed form will need to be faxed back to [facility] fax# [phone] and Kelvin Cellar, fax# [phone].  Hospital discharge notes will need to be included in the faxes.

## 2023-05-14 NOTE — Telephone Encounter (Signed)
PT calling about FMLA ppwk. States deadline is coming up. Please call him @ [phone]

## 2023-05-14 NOTE — Progress Notes (Signed)
Office Visit Note  Patient: Tracy Pugh             Date of Birth: 02/02/1981           MRN: [id number]             PCP: Patient, No Pcp Per Referring: Glendora Score, MD Visit Date: 05/15/2023 Occupation: @GUAROCC @  Subjective:  New Patient (Initial Visit)   Discussed the use of AI scribe software for clinical note transcription with the patient, who gave verbal consent to proceed.  History of Present Illness   Tracy Pugh is a 42 y.o. male with a history of eosinophilic pneumonia, presents with recurrent bouts of pneumonia and respiratory distress since 2022. The onset of symptoms was sudden, following a severe episode of pneumonia that required hospitalization.  Evaluation in April 2022 included extensive serology that was negative for ANCAs primarily characterized by severely elevated eosinophil count of 20 thousand on blood test IgE up to 4600 and bronchioloalveolar lavage with predominantly eosinophilic cells of 85%..  Since then, the patient has experienced multiple episodes of pneumonia, each proving difficult to resolve. The patient reports frequent difficulty breathing, particularly at night, and constant wheezing. These symptoms are often exacerbated by physical exertion, such as climbing stairs at work.  The patient was recently hospitalized due to a severe flare-up of wheezing.  Evaluation at that time was concerned due to presence of peripheral rashes as well. The patient was started on a high-dose prednisone taper and reports feeling significantly better. However, the patient acknowledges that the true state of their health will be more apparent once the prednisone taper is complete.  In addition to respiratory symptoms, the patient has also experienced a new onset of skin manifestations. The patient reports an episode of hives that appeared suddenly on their hands and arms, causing significant itchiness. The hives resolved over a couple of weeks and did not leave any  discoloration or other lasting effects. The patient also reports occasional swelling in their hands, but denies any numbness, weakness, or changes in sensation.  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  The patient has been on Nucala therapy, which was restarted after a recent flare-up. While on this medication, the patient reports some improvement, but still experiences occasional respiratory difficulties, particularly at night. The patient has not reported any kidney problems or other systemic issues.    Labs reviewed 09/2020 IgG 1192 IgG4 457 IgE 3348 MPO/PR3 neg C-ANCA/P-ANCA neg ESR 47 CRP 2.0  04/19/23 CT Chest  IMPRESSION: 1. Findings compatible with bronchopneumonia. This includes a 4.9 cm ground-glass and consolidative opacity in the left lower lobe. Follow-up after treatment is recommended to ensure resolution and exclude underlying malignancy. 2. Multiple peripheral nodules within the trachea and mainstem bronchi. Findings may represent adherent debris/secretions. Differential consideration includes tracheobronchial papillomatosis. Continued attention on follow-up. 3. Increased size of multiple mediastinal lymph nodes compared to 11/16/2020. Attention on follow-up.  Activities of Daily Living:  Patient reports morning stiffness for 0 minute.   Patient Denies nocturnal pain.  Difficulty dressing/grooming: Denies Difficulty climbing stairs: Denies Difficulty getting out of chair: Denies Difficulty using hands for taps, buttons, cutlery, and/or writing: Denies  Review of Systems  Constitutional:  Negative for fatigue.  HENT:  Negative for mouth sores and mouth dryness.   Eyes:  Negative for dryness.  Respiratory:  Negative for shortness of breath.   Cardiovascular:  Negative for chest pain and palpitations.  Gastrointestinal:  Positive for diarrhea. Negative  for blood in stool and constipation.  Endocrine: Negative for increased  urination.  Genitourinary:  Negative for involuntary urination.  Musculoskeletal:  Positive for joint swelling. Negative for joint pain, gait problem, joint pain, myalgias, muscle weakness, morning stiffness, muscle tenderness and myalgias.  Skin:  Negative for color change, rash, hair loss and sensitivity to sunlight.  Allergic/Immunologic: Negative for susceptible to infections.  Neurological:  Negative for dizziness and headaches.  Hematological:  Negative for swollen glands.  Psychiatric/Behavioral:  Negative for depressed mood and sleep disturbance. The patient is not nervous/anxious.     PMFS History:  Patient Active Problem List   Diagnosis Date Noted   Long term systemic steroid user 05/15/2023   Elevated bilirubin 04/29/2023   Acidosis, metabolic 04/29/2023   Pneumonia of both lungs due to infectious organism 04/28/2023   Eosinophilic pneumonia (HCC) 04/27/2023   Acute hypoxemic respiratory failure (HCC) 09/11/2020   Multifocal pneumonia 09/11/2020   Sepsis (HCC) 09/11/2020   Asthma exacerbation 09/11/2020   Hypokalemia 09/11/2020   Mild persistent asthma without complication 02/28/2020   Other allergic rhinitis 02/28/2020   Adverse food reaction 02/28/2020   Allergic conjunctivitis of both eyes 02/28/2020    Past Medical History:  Diagnosis Date   Asthma     Family History  Problem Relation Age of Onset   Rheum arthritis Mother    Past Surgical History:  Procedure Laterality Date   sinus polyp surgery     VIDEO BRONCHOSCOPY N/A 10/12/2020   Procedure: VIDEO BRONCHOSCOPY WITHOUT FLUORO WITH BRONCHOALVEOLAR LAVAGE;  Surgeon: Steffanie Dunn, DO;  Location: [facility];  Service: Endoscopy;  Laterality: N/A;   Social History   Social History Narrative   Not on file    There is no immunization history on file for this patient.   Objective: Vital Signs: BP 125/73 (BP Location: Right Arm, Patient Position: Sitting, Cuff Size: Normal)   Pulse 77   Resp 14   Ht  5\' 7"  (1.702 m)   Wt 180 lb (81.6 kg)   BMI 28.19 kg/m    Physical Exam Eyes:     Conjunctiva/sclera: Conjunctivae normal.  Cardiovascular:     Rate and Rhythm: Normal rate and regular rhythm.  Pulmonary:     Effort: Pulmonary effort is normal.     Breath sounds: Normal breath sounds.  Musculoskeletal:     Right lower leg: No edema.     Left lower leg: No edema.  Lymphadenopathy:     Cervical: No cervical adenopathy.  Skin:    General: Skin is warm and dry.     Findings: No rash.  Neurological:     Mental Status: He is alert.  Psychiatric:        Mood and Affect: Mood normal.      Musculoskeletal Exam:  Shoulders full ROM no tenderness or swelling Elbows full ROM no tenderness or swelling Wrists full ROM no tenderness or swelling Fingers full ROM no tenderness or swelling Knees full ROM no tenderness or swelling Ankles full ROM no tenderness or swelling   Investigation: No additional findings.  Imaging: DG Chest Port 1 View  Result Date: 04/30/2023 CLINICAL DATA:  Follow-up of pneumonia EXAM: PORTABLE CHEST 1 VIEW COMPARISON:  04/27/2023 FINDINGS: Midline trachea. Normal heart size and mediastinal contours. No pleural effusion or pneumothorax. Significant improvement in airspace disease within the left mid lung and right lower lobe laterally. No new pulmonary opacity. IMPRESSION: Improved multifocal airspace disease, most consistent with resolving pneumonia. Electronically Signed   By:  Jeronimo Greaves M.D.   On: 04/30/2023 12:32   DG Chest Portable 1 View  Result Date: 04/27/2023 CLINICAL DATA:  Shortness of breath, cough EXAM: PORTABLE CHEST 1 VIEW COMPARISON:  Radiograph 04/16/2023 and CT chest 04/19/2023 FINDINGS: Reticular and airspace opacities in the bilateral lower lungs are new since 04/16/2023. No pleural effusion or pneumothorax. Stable cardiomediastinal silhouette. No displaced rib fractures. IMPRESSION: Multifocal pneumonia, worsened compared with 04/19/2023.  Follow-up to resolution is recommended Electronically Signed   By: Minerva Fester M.D.   On: 04/27/2023 19:02   CT Chest Wo Contrast  Result Date: 04/19/2023 CLINICAL DATA:  Pneumonia, complication suspected, x-ray done. Pulmonary abnormality on same-day CT abdomen and pelvis. EXAM: CT CHEST WITHOUT CONTRAST TECHNIQUE: Multidetector CT imaging of the chest was performed following the standard protocol without IV contrast. RADIATION DOSE REDUCTION: This exam was performed according to the departmental dose-optimization program which includes automated exposure control, adjustment of the mA and/or kV according to patient size and/or use of iterative reconstruction technique. COMPARISON:  Radiograph 04/16/2023 and CT chest 11/16/2020 FINDINGS: Cardiovascular: Normal heart size. No pericardial effusion. Normal caliber thoracic aorta. Mediastinum/Nodes: Increased size of multiple mediastinal lymph nodes compared to 11/16/2020. For example a precarinal lymph node measures 1.0 cm in short axis on series 2/image 30, previously 0.7 cm. Esophagus is unremarkable. Lungs/Pleura: Multiple peripheral nodules within the trachea and mainstem bronchi. Bronchial wall thickening in the left lower lobe. Centrilobular and tree-in-bud opacities in the lower lungs. Interlobular septal thickening in the lower lungs. 4.9 x 4.0 cm ground-glass opacity in the left lower lobe. No pleural effusion or pneumothorax. Upper Abdomen: See separate report from same day CT abdomen and pelvis. Musculoskeletal: No acute fracture. IMPRESSION: 1. Findings compatible with bronchopneumonia. This includes a 4.9 cm ground-glass and consolidative opacity in the left lower lobe. Follow-up after treatment is recommended to ensure resolution and exclude underlying malignancy. 2. Multiple peripheral nodules within the trachea and mainstem bronchi. Findings may represent adherent debris/secretions. Differential consideration includes tracheobronchial  papillomatosis. Continued attention on follow-up. 3. Increased size of multiple mediastinal lymph nodes compared to 11/16/2020. Attention on follow-up. Electronically Signed   By: Minerva Fester M.D.   On: 04/19/2023 02:39   CT ABDOMEN PELVIS W CONTRAST  Result Date: 04/19/2023 CLINICAL DATA:  Upper abdominal pain with nausea and vomiting EXAM: CT ABDOMEN AND PELVIS WITH CONTRAST TECHNIQUE: Multidetector CT imaging of the abdomen and pelvis was performed using the standard protocol following bolus administration of intravenous contrast. RADIATION DOSE REDUCTION: This exam was performed according to the departmental dose-optimization program which includes automated exposure control, adjustment of the mA and/or kV according to patient size and/or use of iterative reconstruction technique. CONTRAST:  OMNIPAQUE IOHEXOL 300 MG/ML  SOLN COMPARISON:  None Available. FINDINGS: Lower chest: Interlobular septal thickening in the right middle lobe. Bronchial wall thickening and centrilobular micro nodules and tree-in-bud opacities in the lower lungs. Patchy ground-glass opacities greatest in the left lower lobe. Hepatobiliary: Unremarkable liver. Normal gallbladder. No biliary dilation. Pancreas: Unremarkable. Spleen: Unremarkable. Adrenals/Urinary Tract: Normal adrenal glands. No urinary calculi or hydronephrosis. Bladder is unremarkable. Stomach/Bowel: Normal caliber large and small bowel. No bowel wall thickening. The appendix is normal.Stomach is within normal limits. Vascular/Lymphatic: No significant vascular findings are present. No enlarged abdominal or pelvic lymph nodes. Reproductive: Unremarkable. Other: No free intraperitoneal fluid or air. Musculoskeletal: No acute fracture. IMPRESSION: 1. No acute abnormality in the abdomen or pelvis. 2. Bronchopneumonia in the lower lungs. Electronically Signed   By: Joselyn Glassman  Stutzman M.D.   On: 04/19/2023 01:17   DG Chest Portable 1 View  Result Date:  04/16/2023 CLINICAL DATA:  Shortness of breath. EXAM: PORTABLE CHEST 1 VIEW COMPARISON:  Chest radiograph 11/06/2022. FINDINGS: Clear lungs. Normal heart size and mediastinal contours. No pleural effusion or pneumothorax. Visualized bones and upper abdomen are unremarkable. IMPRESSION: No evidence of acute cardiopulmonary disease. Electronically Signed   By: Orvan Falconer M.D.   On: 04/16/2023 08:16    Recent Labs: Lab Results  Component Value Date   WBC 10.4 05/02/2023   HGB 14.8 05/02/2023   PLT 480 (H) 05/02/2023   NA 137 05/02/2023   K 3.8 05/02/2023   CL 103 05/02/2023   CO2 24 05/02/2023   GLUCOSE 94 05/02/2023   BUN 14 05/02/2023   CREATININE 0.81 05/02/2023   BILITOT 0.4 04/29/2023   ALKPHOS 72 04/29/2023   AST 13 (L) 04/29/2023   ALT 20 04/29/2023   PROT 6.5 04/29/2023   ALBUMIN 3.1 (L) 04/29/2023   CALCIUM 8.3 (L) 05/02/2023   GFRAA >60 03/13/2020    Speciality Comments: No specialty comments available.  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 Symptoms include difficulty breathing, especially at night, and wheezing. Currently on a high-dose prednisone taper at 50 mg daily now and recently started on Nucala.  Reassuring findings with no evidence of renal involvement for my review of his previous studies.  Discussed possibility of EGPA which is a more systemic process than the isolated eosinophilic pneumonia but with shared mechanism of inflammation current treatment plan is appropriate. -Continue current treatment plan with Nucala and prednisone taper. -Monitor for response to treatment and potential side effects of long-term steroid use, including cataracts and osteoporosis.  Cutaneous Manifestations Recent episode of hives on hands and arms, resolved over a couple of weeks. No current skin  changes. -Observe for recurrence of skin manifestations. If new rashes or swelling occur, we can try to see back urgently in outpatient setting  Follow-up No fixed appointment scheduled. Plan for follow-up in a few months or as needed based on symptom changes.    Orders: No orders of the defined types were placed in this encounter.  No orders of the defined types were placed in this encounter.    Follow-Up Instructions: Return if symptoms worsen or fail to improve.   Fuller Plan, MD  Note - This record has been created using [employer].  Chart creation errors have been sought, but may not always  have been located. Such creation errors do not reflect on  the standard of medical care.

## 2023-05-15 ENCOUNTER — Ambulatory Visit: Payer: Commercial Managed Care - PPO | Attending: Internal Medicine | Admitting: Internal Medicine

## 2023-05-15 ENCOUNTER — Encounter: Payer: Self-pay | Admitting: Internal Medicine

## 2023-05-15 VITALS — BP 125/73 | HR 77 | Resp 14 | Ht 67.0 in | Wt 180.0 lb

## 2023-05-15 DIAGNOSIS — Z7952 Long term (current) use of systemic steroids: Secondary | ICD-10-CM | POA: Insufficient documentation

## 2023-05-15 DIAGNOSIS — J8281 Chronic eosinophilic pneumonia: Secondary | ICD-10-CM | POA: Diagnosis not present

## 2023-05-19 NOTE — Telephone Encounter (Signed)
Patient called to check on status of disability form - I let him know Dr. Judeth Horn will sign today and I'll fax tomorrow morning.

## 2023-05-19 NOTE — Telephone Encounter (Signed)
Dr. Judeth Horn signed the Disability form and I have faxed it to [facility], Attn:  Franky Macho, fax# [phone] and to [facility], Attn: Riki Altes, fax# [phone] and included hospital discharge notes with each fax.  Mailed hard copy to patient.  Applied $29 processing fee to account.

## 2023-06-02 NOTE — Telephone Encounter (Signed)
nfn

## 2023-06-15 ENCOUNTER — Telehealth: Payer: Self-pay | Admitting: Primary Care

## 2023-06-15 ENCOUNTER — Encounter: Payer: Self-pay | Admitting: Primary Care

## 2023-06-15 ENCOUNTER — Ambulatory Visit (INDEPENDENT_AMBULATORY_CARE_PROVIDER_SITE_OTHER): Payer: Commercial Managed Care - PPO

## 2023-06-15 ENCOUNTER — Ambulatory Visit: Payer: Commercial Managed Care - PPO | Admitting: Primary Care

## 2023-06-15 VITALS — BP 127/84 | HR 73 | Temp 97.5°F | Ht 66.0 in | Wt 179.2 lb

## 2023-06-15 DIAGNOSIS — J4550 Severe persistent asthma, uncomplicated: Secondary | ICD-10-CM

## 2023-06-15 DIAGNOSIS — J8283 Eosinophilic asthma: Secondary | ICD-10-CM | POA: Diagnosis not present

## 2023-06-15 DIAGNOSIS — J180 Bronchopneumonia, unspecified organism: Secondary | ICD-10-CM

## 2023-06-15 MED ORDER — PREDNISONE 10 MG PO TABS: ORAL_TABLET | ORAL | 0 refills | Status: AC

## 2023-06-15 NOTE — Progress Notes (Unsigned)
@Patient  ID: Tracy Pugh, male    DOB: 02/12/1981, 42 y.o.   MRN: [id number]  Chief Complaint  Patient presents with   Follow-up    Referring provider: Glendora Score, MD  HPI:     06/15/2023   Discussed the use of AI scribe software for clinical note transcription with the patient, who gave verbal consent to proceed.  History of Present Illness   The patient, diagnosed with eosinophilic asthma, was recently hospitalized for pneumonia. He has been on Nucala, which was resumed in November after a break. The patient received his first dose of Nucala on the day of his hospital admission. Since then, he has self-administered two more doses at home, with the next dose due in early January.  During his hospital stay, the patient was treated with IV steroids and antibiotics for suspected pneumonia. Despite the absence of a pathogen in the sputum sample and negative respiratory panel for flu and COVID, the patient's chest x-ray showed multifocal pneumonia. However, a follow-up x-ray indicated significant improvement and resolving pneumonia. The patient was discharged without needing oxygen.  The patient has been on a prednisone taper, which he finished about a week ago. He is also on Breo, which he continues to take. Since being off prednisone, the patient reports feeling overall much better, with only occasional wheezing with exertion. He used his rescue inhaler once since leaving the hospital.  The patient also mentioned some issues with his Nucala copay card program and requested assistance with this. He picks up his Nucala from a local [facility].  The patient's eosinophil count, which was very elevated during his hospital stay, has since decreased significantly. The patient denies any cough and reports his lungs feel clear. He has nebulizers on hand if needed.           Allergies  Allergen Reactions   Vantin [Cefpodoxime] Hives     There is no immunization history on  file for this patient.  Past Medical History:  Diagnosis Date   Asthma     Tobacco History: Social History   Tobacco Use  Smoking Status Former   Current packs/day: 0.00   Types: Cigarettes   Start date: 2002   Quit date: 2019   Years since quitting: 5.9   Passive exposure: Past  Smokeless Tobacco Never   Counseling given: Not Answered   Outpatient Medications Prior to Visit  Medication Sig Dispense Refill   albuterol (PROVENTIL) (2.5 MG/3ML) 0.083% nebulizer solution Take 3 mLs (2.5 mg total) by nebulization every 6 (six) hours as needed for wheezing or shortness of breath. 75 mL 12   BREO ELLIPTA 200-25 MCG/ACT AEPB INHALE 1 PUFF DAILY INTO LUNGS 60 each 11   cetirizine (ZYRTEC) 10 MG tablet Take 1 tablet (10 mg total) by mouth daily. 30 tablet 1   fluticasone (FLONASE ALLERGY RELIEF) 50 MCG/ACT nasal spray Place 1 spray into both nostrils 2 (two) times daily. 18.2 mL 3   levalbuterol (XOPENEX HFA) 45 MCG/ACT inhaler Inhale 2 puffs into the lungs every 6 (six) hours as needed for wheezing. 15 g 0   Mepolizumab (NUCALA) 100 MG/ML SOAJ Inject 1 mL (100 mg total) into the skin every 28 (twenty-eight) days. 1 mL 5   pantoprazole (PROTONIX) 40 MG tablet Take 1 tablet (40 mg total) by mouth 2 (two) times daily. 120 tablet 1   sodium chloride (OCEAN) 0.65 % SOLN nasal spray Place 1 spray into both nostrils as needed for congestion. 60 mL 0  ipratropium-albuterol (DUONEB) 0.5-2.5 (3) MG/3ML SOLN Take 3 mLs by nebulization 3 (three) times daily for 10 days. 360 mL 0   famotidine (PEPCID) 20 MG tablet Take 1 tablet (20 mg total) by mouth 2 (two) times daily. (Patient not taking: Reported on 06/15/2023) 30 tablet 0   HYDROcodone bit-homatropine (HYCODAN) 5-1.5 MG/5ML syrup Take 5 mLs by mouth every 6 (six) hours as needed for cough. (Patient not taking: Reported on 06/15/2023) 120 mL 0   predniSONE (DELTASONE) 10 MG tablet Take 6 tablets (60 mg total) by mouth daily for 7 days, THEN 5  tablets (50 mg total) daily for 7 days, THEN 4 tablets (40 mg total) daily for 7 days, THEN 3 tablets (30 mg total) daily for 7 days, THEN 2 tablets (20 mg total) daily. (Patient not taking: Reported on 06/15/2023) 186 tablet 0   sulfamethoxazole-trimethoprim (BACTRIM DS) 800-160 MG tablet Take 1 tablet by mouth 3 (three) times a week. (Patient not taking: Reported on 06/15/2023) 12 tablet 1   No facility-administered medications prior to visit.      Review of Systems  Review of Systems   Physical Exam  BP 127/84 (BP Location: Left Arm, Patient Position: Sitting, Cuff Size: Normal)   Pulse 73   Temp (!) 97.5 F (36.4 C) (Temporal)   Ht 5\' 6"  (1.676 m)   Wt 179 lb 3.2 oz (81.3 kg)   SpO2 97%   BMI 28.92 kg/m  Physical Exam   Lab Results:  CBC    Component Value Date/Time   WBC 10.4 05/02/2023 0621   RBC 4.81 05/02/2023 0621   HGB 14.8 05/02/2023 0621   HCT 43.7 05/02/2023 0621   PLT 480 (H) 05/02/2023 0621   MCV 90.9 05/02/2023 0621   MCH 30.8 05/02/2023 0621   MCHC 33.9 05/02/2023 0621   RDW 12.0 05/02/2023 0621   LYMPHSABS 1.6 05/01/2023 0552   MONOABS 1.0 05/01/2023 0552   EOSABS 0.1 05/01/2023 0552   BASOSABS 0.0 05/01/2023 0552    BMET    Component Value Date/Time   NA 137 05/02/2023 0621   K 3.8 05/02/2023 0621   CL 103 05/02/2023 0621   CO2 24 05/02/2023 0621   GLUCOSE 94 05/02/2023 0621   BUN 14 05/02/2023 0621   CREATININE 0.81 05/02/2023 0621   CALCIUM 8.3 (L) 05/02/2023 0621   GFRNONAA >60 05/02/2023 0621   GFRAA >60 03/13/2020 1657    BNP    Component Value Date/Time   BNP 23.3 10/09/2020 0339    ProBNP No results found for: "PROBNP"  Imaging: No results found.   Assessment & Plan:   No problem-specific Assessment & Plan notes found for this encounter.     Glenford Bayley, NP 06/15/2023

## 2023-06-15 NOTE — Telephone Encounter (Signed)
Patient enrolled into copay card program to help with copay assistance, he needs to be provided with this information again. [facility]

## 2023-06-15 NOTE — Telephone Encounter (Signed)
MyChart message sent to patient with information.

## 2023-06-15 NOTE — Patient Instructions (Addendum)
-EOSINOPHILIC ASTHMA: Eosinophilic asthma is a type of asthma characterized by high levels of eosinophils, a type of white blood cell, in the lungs. You should continue taking Nucala as prescribed. We will check your blood count to monitor your eosinophil levels. If your eosinophil count is significantly elevated, we may consider another prednisone taper.  -PNEUMONIA: Pneumonia is an infection that inflames the air sacs in one or both lungs. You were recently hospitalized for multifocal pneumonia, but your follow-up chest x-ray showed significant improvement. We will order another chest x-ray to ensure the pneumonia has completely resolved.  -GENERAL HEALTH MAINTENANCE: We discussed the importance of getting a flu vaccination, especially given your recent pneumonia and severe asthma. Make sure you have access to prednisone for emergency use in case of a severe asthma flare. We will also coordinate with your pharmacy to ensure you have access to the Nucala copay assistance program.  Rx: Prednisone taper to have on hand for exacerbation  Orders: CXR today and labs   INSTRUCTIONS: Please continue taking Nucala as prescribed and monitor your symptoms. We will check your blood count to monitor your eosinophil levels. If you experience any severe asthma symptoms, use your rescue inhaler and notify us if you need prednisone. We will also order a follow-up chest x-ray to ensure your pneumonia has completely resolved. Make sure to get your flu vaccination and contact us if you have any issues with your Nucala copay card program.  Follow-up 3 months with Dr. Judeth Horn or sooner if needed    Influenza (Flu) Vaccine (Inactivated or Recombinant): What You Need to Know Many vaccine information statements are available in Spanish and other languages. See PromoAge.com.br. 1. Why get vaccinated? Influenza vaccine can prevent influenza (flu). Flu is a contagious disease that spreads around the  [place] every year, usually between October and May. Anyone can get the flu, but it is more dangerous for some people. Infants and young children, people 75 years and older, pregnant people, and people with certain health conditions or a weakened immune system are at greatest risk of flu complications. Pneumonia, bronchitis, sinus infections, and ear infections are examples of flu-related complications. If you have a medical condition, such as heart disease, cancer, or diabetes, flu can make it worse. Flu can cause fever and chills, sore throat, muscle aches, fatigue, cough, headache, and runny or stuffy nose. Some people may have vomiting and diarrhea, though this is more common in children than adults. In an average year, thousands of people in the [place] die from flu, and many more are hospitalized. Flu vaccine prevents millions of illnesses and flu-related visits to the doctor each year. 2. Influenza vaccines CDC recommends everyone 6 months and older get vaccinated every flu season. Children 6 months through 42 years of age may need 2 doses during a single flu season. Everyone else needs only 1 dose each flu season. It takes about 2 weeks for protection to develop after vaccination. There are many flu viruses, and they are always changing. Each year a new flu vaccine is made to protect against the influenza viruses believed to be likely to cause disease in the upcoming flu season. Even when the vaccine doesn't exactly match these viruses, it may still provide some protection. Influenza vaccine does not cause flu. Influenza vaccine may be given at the same time as other vaccines. 3. Talk with your health care provider Tell your vaccination provider if the person getting the vaccine: Has had an allergic reaction  after a previous dose of influenza vaccine, or has any severe, life-threatening allergies Has ever had Guillain-Barr Syndrome (also called "GBS") In some cases, your health care  provider may decide to postpone influenza vaccination until a future visit. Influenza vaccine can be administered at any time during pregnancy. People who are or will be pregnant during influenza season should receive inactivated influenza vaccine. People with minor illnesses, such as a cold, may be vaccinated. People who are moderately or severely ill should usually wait until they recover before getting influenza vaccine. Your health care provider can give you more information. 4. Risks of a vaccine reaction Soreness, redness, and swelling where the shot is given, fever, muscle aches, and headache can happen after influenza vaccination. There may be a very small increased risk of Guillain-Barr Syndrome (GBS) after inactivated influenza vaccine (the flu shot). Young children who get the flu shot along with pneumococcal vaccine (PCV13) and/or DTaP vaccine at the same time might be slightly more likely to have a seizure caused by fever. Tell your health care provider if a child who is getting flu vaccine has ever had a seizure. People sometimes faint after medical procedures, including vaccination. Tell your provider if you feel dizzy or have vision changes or ringing in the ears. As with any medicine, there is a very remote chance of a vaccine causing a severe allergic reaction, other serious injury, or death. 5. What if there is a serious problem? An allergic reaction could occur after the vaccinated person leaves the clinic. If you see signs of a severe allergic reaction (hives, swelling of the face and throat, difficulty breathing, a fast heartbeat, dizziness, or weakness), call 9-1-1 and get the person to the nearest hospital. For other signs that concern you, call your health care provider. Adverse reactions should be reported to the Vaccine Adverse Event Reporting System (VAERS). Your health care provider will usually file this report, or you can do it yourself. Visit the VAERS website at  www.vaers.LAgents.no or call 8124921866. VAERS is only for reporting reactions, and VAERS staff members do not give medical advice. 6. The National Vaccine Injury Compensation Program The [employer] Vaccine Injury Compensation Program (VICP) is a federal program that was created to compensate people who may have been injured by certain vaccines. Claims regarding alleged injury or death due to vaccination have a time limit for filing, which may be as short as two years. Visit the VICP website at SpiritualWord.at or call 540-219-6061 to learn about the program and about filing a claim. 7. How can I learn more? Ask your health care provider. Call your local or state health department. Visit the website of the Food and Drug Administration (FDA) for vaccine package inserts and additional information at FinderList.no. Contact the Centers for Disease Control and Prevention (CDC): Call (708)444-9132 (1-800-CDC-INFO) or Visit CDC's website at BiotechRoom.com.cy. Source: CDC Vaccine Information Statement Inactivated Influenza Vaccine (01/27/2020) This same material is available at FootballExhibition.com.br for no charge. This information is not intended to replace advice given to you by your health care provider. Make sure you discuss any questions you have with your health care provider. Document Revised: 09/24/2022 Document Reviewed: 06/30/2022 Elsevier Patient Education  2024 [employer].

## 2023-08-25 ENCOUNTER — Encounter: Payer: Self-pay | Admitting: Pulmonary Disease

## 2023-08-25 ENCOUNTER — Ambulatory Visit: Payer: Commercial Managed Care - PPO | Admitting: Pulmonary Disease

## 2023-08-25 VITALS — BP 119/83 | HR 73 | Ht 66.0 in | Wt 184.6 lb

## 2023-08-25 DIAGNOSIS — J8283 Eosinophilic asthma: Secondary | ICD-10-CM

## 2023-08-25 DIAGNOSIS — J4550 Severe persistent asthma, uncomplicated: Secondary | ICD-10-CM

## 2023-08-25 DIAGNOSIS — Z87891 Personal history of nicotine dependence: Secondary | ICD-10-CM

## 2023-08-25 DIAGNOSIS — J8281 Chronic eosinophilic pneumonia: Secondary | ICD-10-CM | POA: Diagnosis not present

## 2023-08-25 DIAGNOSIS — J4530 Mild persistent asthma, uncomplicated: Secondary | ICD-10-CM | POA: Diagnosis not present

## 2023-08-25 NOTE — Progress Notes (Signed)
 @Patient  ID: Tracy Pugh, male    DOB: 1981-03-06, 43 y.o.   MRN: [id number]  Chief Complaint  Patient presents with   Follow-up    Pt claims asthma has been doing good, no other concerns     Referring provider: No ref. provider found  HPI:   43 y.o. 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  Nucala discontinued follow-up 2023 after well-controlled disease.  Recurrence of symptoms with chest x-ray changes 10/2022 concerning for recurrence of eosinophilic pneumonia placed on steroid taper.  Placed on steroid taper, decrease by 10 mg every 7 days starting at 40 mg then after 10 mg of the 5 mg for 7 days.  Symptoms improved.  Chest x-ray reviewed with interstitial markings concerning for recurrence of.  Regardless certainly concern for eosinophilic asthma once again.  Symptoms improved with steroid taper.  Eosinophils markedly elevated 2.3K on most recent labs.  Chest x-ray with some mild interstitial bronchitic changes.  Symptoms much better with prednisone taper.  Noticed a little bit of congestion worsening when he went to 5 mg dose.  Now off prednisone and feels like things are doing okay.  Significant nasal congestion.  HPI at initial visit: Patient was diagnosed with COVID in 06/2020.  And symptoms had issues with ongoing cough, shortness of breath.  This worsened gradually over time to the point where he needed hospitalization March 2022.  Treated with antibiotics.  Treated with steroids.  Minimal improvement for a bit of time.  Worsening.  Subsequently readmitted 09/2020.  CT chest reviewed and interpreted as bilateral right greater than left groundglass and denser infiltrates throughout.  Chest x-ray on admission demonstrated right greater than left upper and lower lobe infiltrates on my interpretation as well.  Peripheral eosinophilia is around 21,000.  Did not abate with couple doses of steroids.   Had BAL performed which demonstrated a 5% eosinophils.  Was placed on prednisone 40 mg daily.  Reports gradual improvement in symptoms.  No cough.  Dyspnea essentially gone.  Feels well.  Reviewed labs during hospitalization which demonstrated negative ANCA, Strongyloides IgG negative, elevated ESR and CRP.  Infectious work-up negative.  IgE greatly elevated in the several thousand range.  PMH: Asthma Surgical history: Sinus surgery in 2021 Family history: Allergies in freshly relatives, otherwise no respiratory illnesses in first-degree relatives Social history: Former smoker, quit in 2015, lives in [place]   Questionaires / Pulmonary Flowsheets:   ACT:  Asthma Control Test ACT Total Score  06/15/2023 11:34 AM 24  06/28/2021  8:58 AM 19  02/13/2021  4:21 PM 21    MMRC: mMRC Dyspnea Scale mMRC Score  10/23/2020 11:03 AM 0    Epworth:      No data to display          Tests:   FENO:  No results found for: "NITRICOXIDE"  PFT:     No data to display          WALK:      No data to display          Imaging: Personally reviewed and as per EMR discussion this note No results found.   Lab Results: Personally reviewed CBC    Component Value Date/Time   WBC 10.4 05/02/2023 0621   RBC 4.81 05/02/2023 0621   HGB 14.8 05/02/2023 0621   HCT 43.7 05/02/2023 0621   PLT 480 (H) 05/02/2023 0621   MCV 90.9 05/02/2023 2130  MCH 30.8 05/02/2023 0621   MCHC 33.9 05/02/2023 0621   RDW 12.0 05/02/2023 0621   LYMPHSABS 1.6 05/01/2023 0552   MONOABS 1.0 05/01/2023 0552   EOSABS 0.1 05/01/2023 0552   BASOSABS 0.0 05/01/2023 0552    BMET    Component Value Date/Time   NA 137 05/02/2023 0621   K 3.8 05/02/2023 0621   CL 103 05/02/2023 0621   CO2 24 05/02/2023 0621   GLUCOSE 94 05/02/2023 0621   BUN 14 05/02/2023 0621   CREATININE 0.81 05/02/2023 0621   CALCIUM 8.3 (L) 05/02/2023 0621   GFRNONAA >60 05/02/2023 0621   GFRAA >60 03/13/2020 1657    BNP     Component Value Date/Time   BNP 23.3 10/09/2020 0339    ProBNP No results found for: "PROBNP"  Specialty Problems       Pulmonary Problems   Mild persistent asthma without complication   Other allergic rhinitis   Acute hypoxemic respiratory failure (HCC)   Asthma exacerbation   Multifocal pneumonia   Eosinophilic pneumonia (HCC)   Pneumonia of both lungs due to infectious organism    Allergies  Allergen Reactions   Vantin [Cefpodoxime] Hives     There is no immunization history on file for this patient.  Past Medical History:  Diagnosis Date   Asthma     Tobacco History: Social History   Tobacco Use  Smoking Status Former   Current packs/day: 0.00   Types: Cigarettes   Start date: 2002   Quit date: 2019   Years since quitting: 6.1   Passive exposure: Past  Smokeless Tobacco Never   Counseling given: Not Answered    Continue to not smoke  Outpatient Encounter Medications as of 08/25/2023  Medication Sig   BREO ELLIPTA 200-25 MCG/ACT AEPB INHALE 1 PUFF DAILY INTO LUNGS   albuterol (PROVENTIL) (2.5 MG/3ML) 0.083% nebulizer solution Take 3 mLs (2.5 mg total) by nebulization every 6 (six) hours as needed for wheezing or shortness of breath.   cetirizine (ZYRTEC) 10 MG tablet Take 1 tablet (10 mg total) by mouth daily.   fluticasone (FLONASE ALLERGY RELIEF) 50 MCG/ACT nasal spray Place 1 spray into both nostrils 2 (two) times daily.   ipratropium-albuterol (DUONEB) 0.5-2.5 (3) MG/3ML SOLN Take 3 mLs by nebulization 3 (three) times daily for 10 days.   levalbuterol (XOPENEX HFA) 45 MCG/ACT inhaler Inhale 2 puffs into the lungs every 6 (six) hours as needed for wheezing.   Mepolizumab (NUCALA) 100 MG/ML SOAJ Inject 1 mL (100 mg total) into the skin every 28 (twenty-eight) days.   pantoprazole (PROTONIX) 40 MG tablet Take 1 tablet (40 mg total) by mouth 2 (two) times daily.   predniSONE (DELTASONE) 10 MG tablet 4 tabs for 2 days, then 3 tabs for 2 days, 2 tabs for  2 days, then 1 tab for 2 days, then stop (Patient not taking: Reported on 08/25/2023)   sodium chloride (OCEAN) 0.65 % SOLN nasal spray Place 1 spray into both nostrils as needed for congestion.   No facility-administered encounter medications on file as of 08/25/2023.     Review of Systems  Review of Systems  N/a Physical Exam  BP 119/83   Pulse 73   Ht 5\' 6"  (1.676 m)   Wt 184 lb 9.6 oz (83.7 kg)   SpO2 95%   BMI 29.80 kg/m   Wt Readings from Last 5 Encounters:  08/25/23 184 lb 9.6 oz (83.7 kg)  06/15/23 179 lb 3.2 oz (81.3 kg)  05/15/23  180 lb (81.6 kg)  04/27/23 178 lb 9.2 oz (81 kg)  04/18/23 180 lb (81.6 kg)    BMI Readings from Last 5 Encounters:  08/25/23 29.80 kg/m  06/15/23 28.92 kg/m  05/15/23 28.19 kg/m  04/27/23 28.82 kg/m  04/18/23 29.05 kg/m     Physical Exam General: Well-appearing, no acute distress Eyes: EOMI, icterus Neck: Supple no JVP Cardiovascular: Regular rhythm, no murmur Pulmonary: Clear to auscultation bilaterally, no crackles or wheeze, normal work of breathing MSK: No synovitis, no joint effusion Neuro: Normal gait, no weakness Psych: Normal mood, full affect   Assessment & Plan:   Chronic Eosinophilic pneumonia: Diagnosed 4540 bilateral R>L infiltrates, weeks of symptoms, Eos ~21k on CBC, 85% eos on BAL. Much improved symptomatically on prednisone 40 mg daily (0.5 mg/kg). Symptoms recurred on 10 mg of prednisone during initial taper.  Chest x-ray with worsened interstitial markings.  He was placed on a slower taper at 20 mg.  He had improved symptoms.  Exam improved.  Weaned off steroids.  Eventual remission with Nucala.  Subsequent discontinued as was feeling well.  Recurrent symptoms exacerbation in early 2024.  Mid 2024.  Subsequent worsening to the fall.  Nucala reordered but worsening symptoms prompting hospitalization.  Quick improvement in imaging and symptoms of IV steroids and subsequent steroid taper.  Marked improvement with  ongoing Nucala.  Occasional breakthrough symptoms may require short duration of anti-inflammatories, steroids but hopefully can keep things at bay with Nucala once again.  Eosinophilic asthma: preceded diagnosis as above.  Continue breo, continue Nucala.    Return in about 6 months (around 02/25/2024) for f/u Dr. Judeth Horn.   Karren Burly, MD 08/25/2023

## 2023-09-22 ENCOUNTER — Telehealth: Payer: Self-pay | Admitting: Pharmacist

## 2023-09-22 NOTE — Telephone Encounter (Signed)
 Submitted a Prior Authorization request to [facility] for NUCALA via fax. Will update once we receive a response.  Fax: [phone] Phone: [phone] Case # [id number]  Chesley Mires, PharmD, MPH, BCPS, CPP Clinical Pharmacist (Rheumatology and Pulmonology)

## 2023-09-24 NOTE — Telephone Encounter (Signed)
 Received notification from [facility] regarding a prior authorization for NUCALA. Authorization has been APPROVED from 09/23/2023 to 09/22/2024. Approval letter sent to scan center.  Authorization # [id number]  Chesley Mires, PharmD, MPH, BCPS, CPP Clinical Pharmacist (Rheumatology and Pulmonology)

## 2023-11-02 ENCOUNTER — Other Ambulatory Visit: Payer: Self-pay | Admitting: Pulmonary Disease

## 2023-11-02 DIAGNOSIS — J8283 Eosinophilic asthma: Secondary | ICD-10-CM

## 2023-11-02 DIAGNOSIS — J4541 Moderate persistent asthma with (acute) exacerbation: Secondary | ICD-10-CM

## 2023-11-03 NOTE — Telephone Encounter (Signed)
 Refill sent for NUCALA  to [facility]: [phone]  Dose: 100mg  subcut every 4 weeks  Last OV: 08/25/2023 Provider: Dr. Marygrace Snellen  Next OV: due in Sept 2025 (not yet scheduled)  Geraldene Kleine, PharmD, MPH, BCPS Clinical Pharmacist (Rheumatology and Pulmonology)

## 2024-01-02 ENCOUNTER — Other Ambulatory Visit: Payer: Self-pay | Admitting: Pulmonary Disease

## 2024-01-02 DIAGNOSIS — J4530 Mild persistent asthma, uncomplicated: Secondary | ICD-10-CM

## 2024-03-01 ENCOUNTER — Other Ambulatory Visit: Payer: Self-pay | Admitting: Pulmonary Disease

## 2024-03-01 DIAGNOSIS — J4530 Mild persistent asthma, uncomplicated: Secondary | ICD-10-CM

## 2024-03-01 MED ORDER — FLUTICASONE FUROATE-VILANTEROL 200-25 MCG/ACT IN AEPB: 1.0000 | INHALATION_SPRAY | Freq: Every day | RESPIRATORY_TRACT | 5 refills | Status: AC

## 2024-03-01 NOTE — Telephone Encounter (Signed)
 Copied from CRM [id number]. Topic: Clinical - Medication Refill >> Mar 01, 2024  1:57 PM Russell PARAS wrote: Medication: fluticasone  furoate-vilanterol (BREO ELLIPTA ) 200-25 MCG/ACT AEPB   Has the patient contacted their pharmacy? Yes (Agent: If no, request that the patient contact the pharmacy for the refill. If patient does not wish to contact the pharmacy document the reason why and proceed with request.) (Agent: If yes, when and what did the pharmacy advise?)  This is the patient's preferred pharmacy:  [id number] [place], [place] - [address] [place] [address] [place] [address] [place] [address] Phone: [phone] Fax: [phone]   Is this the correct pharmacy for this prescription? Yes If no, delete pharmacy and type the correct one.   Has the prescription been filled recently? Yes  Is the patient out of the medication? Yes, has been out for 1 week  Has the patient been seen for an appointment in the last year OR does the patient have an upcoming appointment? Yes, last OV was 08/25/2023, coming up visit on 10/30  Can we respond through MyChart? Yes  Agent: Please be advised that Rx refills may take up to 3 business days. We ask that you follow-up with your pharmacy.

## 2024-03-01 NOTE — Telephone Encounter (Signed)
 FYI Only or Action Required?: Action required by provider: medication refill request.  Patient is followed in Pulmonology for Asthma without complication, last seen on 08/25/2023 by Hunsucker, Donnice SAUNDERS, MD.

## 2024-04-06 ENCOUNTER — Other Ambulatory Visit: Payer: Self-pay | Admitting: Pulmonary Disease

## 2024-04-06 DIAGNOSIS — J8283 Eosinophilic asthma: Secondary | ICD-10-CM

## 2024-04-06 DIAGNOSIS — J4541 Moderate persistent asthma with (acute) exacerbation: Secondary | ICD-10-CM

## 2024-04-06 NOTE — Telephone Encounter (Signed)
 Refill sent for NUCALA  to [facility]: [phone]  Dose: 100mg  [place] every 4 weeks   Last OV: 08/25/23 Provider: Dr. Annella  Next OV: 04/21/24  Routing to scheduling team for follow-up on appt scheduling  Aleck Puls, PharmD, BCPS Clinical Pharmacist  [facility]

## 2024-04-21 ENCOUNTER — Ambulatory Visit: Admitting: Pulmonary Disease

## 2024-04-21 ENCOUNTER — Encounter: Payer: Self-pay | Admitting: Pulmonary Disease

## 2024-04-21 VITALS — BP 122/78 | HR 79 | Ht 66.0 in | Wt 180.4 lb

## 2024-04-21 DIAGNOSIS — J8281 Chronic eosinophilic pneumonia: Secondary | ICD-10-CM | POA: Diagnosis not present

## 2024-04-21 DIAGNOSIS — J8283 Eosinophilic asthma: Secondary | ICD-10-CM

## 2024-04-21 DIAGNOSIS — R233 Spontaneous ecchymoses: Secondary | ICD-10-CM | POA: Diagnosis not present

## 2024-04-21 DIAGNOSIS — J4530 Mild persistent asthma, uncomplicated: Secondary | ICD-10-CM | POA: Diagnosis not present

## 2024-04-21 NOTE — Patient Instructions (Signed)
 Nice to see you  Labs today to make sure the bruising is nothing to worry about  No changes   Return to clinic in 6 months

## 2024-04-21 NOTE — Progress Notes (Signed)
 @Patient  ID: Tracy Pugh, male    DOB: Mar 28, 1981, 43 y.o.   MRN: [id number]  Chief Complaint  Patient presents with   Medical Management of Chronic Issues    Pt states here for f/u everything ha been well    Referring provider: No ref. provider found  HPI:   43 y.o. 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  Nucala  discontinued follow-up 2023 after well-controlled disease.  Recurrence of symptoms with chest x-ray changes 10/2022 concerning for recurrence of eosinophilic pneumonia placed on steroid taper.  Overall, doing well.  Good adherence to Nucala .  And inhaler.  Breo.  No concerning symptoms.  No exacerbations.  Rare albuterol  use with change in weather patterns.  But nothing concerning.  He reports noticing more bruising.  He also reports he often runs into things that may just be due to that.  HPI at initial visit: Patient was diagnosed with COVID in 06/2020.  And symptoms had issues with ongoing cough, shortness of breath.  This worsened gradually over time to the point where he needed hospitalization March 2022.  Treated with antibiotics.  Treated with steroids.  Minimal improvement for a bit of time.  Worsening.  Subsequently readmitted 09/2020.  CT chest reviewed and interpreted as bilateral right greater than left groundglass and denser infiltrates throughout.  Chest x-ray on admission demonstrated right greater than left upper and lower lobe infiltrates on my interpretation as well.  Peripheral eosinophilia is around 21,000.  Did not abate with couple doses of steroids.  Had BAL performed which demonstrated a 5% eosinophils.  Was placed on prednisone  40 mg daily.  Reports gradual improvement in symptoms.  No cough.  Dyspnea essentially gone.  Feels well.  Reviewed labs during hospitalization which demonstrated negative ANCA, Strongyloides IgG negative, elevated ESR and CRP.  Infectious work-up  negative.  IgE greatly elevated in the several thousand range.  PMH: Asthma Surgical history: Sinus surgery in 2021 Family history: Allergies in freshly relatives, otherwise no respiratory illnesses in first-degree relatives Social history: Former smoker, quit in 2015[place]   Questionaires / Pulmonary Flowsheets:   ACT:  Asthma Control Test ACT Total Score  06/15/2023 11:34 AM 24  06/28/2021  8:58 AM 19  02/13/2021  4:21 PM 21    MMRC: mMRC Dyspnea Scale mMRC Score  10/23/2020 11:03 AM 0    Epworth:      No data to display          Tests:   FENO:  No results found for: NITRICOXIDE  PFT:     No data to display          WALK:      No data to display          Imaging: Personally reviewed and as per EMR discussion this note No results found.   Lab Results: Personally reviewed CBC    Component Value Date/Time   WBC 10.4 05/02/2023 0621   RBC 4.81 05/02/2023 0621   HGB 14.8 05/02/2023 0621   HCT 43.7 05/02/2023 0621   PLT 480 (H) 05/02/2023 0621   MCV 90.9 05/02/2023 0621   MCH 30.8 05/02/2023 0621   MCHC 33.9 05/02/2023 0621   RDW 12.0 05/02/2023 0621   LYMPHSABS 1.6 05/01/2023 0552   MONOABS 1.0 05/01/2023 0552   EOSABS 0.1 05/01/2023 0552   BASOSABS 0.0 05/01/2023 0552    BMET    Component Value Date/Time  NA 137 05/02/2023 0621   K 3.8 05/02/2023 0621   CL 103 05/02/2023 0621   CO2 24 05/02/2023 0621   GLUCOSE 94 05/02/2023 0621   BUN 14 05/02/2023 0621   CREATININE 0.81 05/02/2023 0621   CALCIUM 8.3 (L) 05/02/2023 0621   GFRNONAA >60 05/02/2023 0621   GFRAA >60 03/13/2020 1657    BNP    Component Value Date/Time   BNP 23.3 10/09/2020 0339    ProBNP No results found for: PROBNP  Specialty Problems       Pulmonary Problems   Mild persistent asthma without complication   Other allergic rhinitis   Acute hypoxemic respiratory failure (HCC)   Asthma exacerbation   Multifocal pneumonia    Eosinophilic pneumonia (HCC)   Pneumonia of both lungs due to infectious organism    Allergies  Allergen Reactions   Vantin  [Cefpodoxime ] Hives     There is no immunization history on file for this patient.  Past Medical History:  Diagnosis Date   Asthma     Tobacco History: Social History   Tobacco Use  Smoking Status Former   Current packs/day: 0.00   Types: Cigarettes   Start date: 2002   Quit date: 2019   Years since quitting: 6.8   Passive exposure: Past  Smokeless Tobacco Never   Counseling given: Not Answered    Continue to not smoke  Outpatient Encounter Medications as of 04/21/2024  Medication Sig   albuterol  (PROVENTIL ) (2.5 MG/3ML) 0.083% nebulizer solution Take 3 mLs (2.5 mg total) by nebulization every 6 (six) hours as needed for wheezing or shortness of breath.   cetirizine  (ZYRTEC ) 10 MG tablet Take 1 tablet (10 mg total) by mouth daily.   fluticasone  (FLONASE  ALLERGY RELIEF) 50 MCG/ACT nasal spray Place 1 spray into both nostrils 2 (two) times daily.   fluticasone  furoate-vilanterol (BREO ELLIPTA ) 200-25 MCG/ACT AEPB Inhale 1 puff into the lungs daily.   ipratropium-albuterol  (DUONEB) 0.5-2.5 (3) MG/3ML SOLN Take 3 mLs by nebulization 3 (three) times daily for 10 days.   levalbuterol  (XOPENEX  HFA) 45 MCG/ACT inhaler Inhale 2 puffs into the lungs every 6 (six) hours as needed for wheezing.   NUCALA  100 MG/ML SOAJ INJECT 1 PEN UNDER THE SKIN EVERY 28 DAYS   pantoprazole  (PROTONIX ) 40 MG tablet Take 1 tablet (40 mg total) by mouth 2 (two) times daily.   predniSONE  (DELTASONE ) 10 MG tablet 4 tabs for 2 days, then 3 tabs for 2 days, 2 tabs for 2 days, then 1 tab for 2 days, then stop   sodium chloride  (OCEAN) 0.65 % SOLN nasal spray Place 1 spray into both nostrils as needed for congestion.   No facility-administered encounter medications on file as of 04/21/2024.     Review of Systems  Review of Systems  N/a Physical Exam  BP 122/78   Pulse 79    Ht 5' 6 (1.676 m) Comment: per pt  Wt 180 lb 6.4 oz (81.8 kg)   SpO2 97%   BMI 29.12 kg/m   Wt Readings from Last 5 Encounters:  04/21/24 180 lb 6.4 oz (81.8 kg)  08/25/23 184 lb 9.6 oz (83.7 kg)  06/15/23 179 lb 3.2 oz (81.3 kg)  05/15/23 180 lb (81.6 kg)  04/27/23 178 lb 9.2 oz (81 kg)    BMI Readings from Last 5 Encounters:  04/21/24 29.12 kg/m  08/25/23 29.80 kg/m  06/15/23 28.92 kg/m  05/15/23 28.19 kg/m  04/27/23 28.82 kg/m     Physical Exam General: Sitting in  chair, well-appearing Eyes: No icterus Neck: No JVP appreciated Cardiovascular: Warm, no edema Pulmonary: Clear to auscultation bilaterally, normal work of breathing Neuro: No focal deficits   Assessment & Plan:   Chronic Eosinophilic pneumonia: Diagnosed 7977 bilateral R>L infiltrates, weeks of symptoms, Eos ~21k on CBC, 85% eos on BAL. Much improved symptomatically on prednisone  40 mg daily (0.5 mg/kg). Symptoms recurred on 10 mg of prednisone  during initial taper.  Chest x-ray with worsened interstitial markings.  He was placed on a slower taper at 20 mg.  He had improved symptoms.  Exam improved.  Weaned off steroids.  Eventual remission with Nucala .  Subsequent discontinued as was feeling well.  Recurrent symptoms exacerbation in early 2024.  Mid 2024.  Subsequent worsening to the fall.  Nucala  reordered but worsening symptoms prompting hospitalization.  Quick improvement in imaging and symptoms of IV steroids and subsequent steroid taper.  Marked improvement with ongoing Nucala .  Sustained improvement well-controlled disease.  Eosinophilic asthma: Continue Breo, continue Nucala .  Continue albuterol  as needed, rare use with fluctuations of temperature, weather patterns.  Easy bruising: Suspect related to repetitive trauma, running into things.  INR, PT, CBC with platelets to evaluate.   Return in about 6 months (around 10/20/2024) for f/u Dr. Annella.   Donnice JONELLE Annella, MD 04/21/2024

## 2024-04-22 LAB — CBC WITH DIFFERENTIAL/PLATELET
Basophils Absolute: 0.1 K/uL (ref 0.0–0.1)
Basophils Relative: 1 % (ref 0.0–3.0)
Eosinophils Absolute: 0.1 K/uL (ref 0.0–0.7)
Eosinophils Relative: 1 % (ref 0.0–5.0)
HCT: 47.5 % (ref 39.0–52.0)
Hemoglobin: 16.1 g/dL (ref 13.0–17.0)
Lymphocytes Relative: 31.4 % (ref 12.0–46.0)
Lymphs Abs: 2.2 K/uL (ref 0.7–4.0)
MCHC: 33.9 g/dL (ref 30.0–36.0)
MCV: 91.6 fl (ref 78.0–100.0)
Monocytes Absolute: 0.9 K/uL (ref 0.1–1.0)
Monocytes Relative: 13 % — ABNORMAL HIGH (ref 3.0–12.0)
Neutro Abs: 3.7 K/uL (ref 1.4–7.7)
Neutrophils Relative %: 53.6 % (ref 43.0–77.0)
Platelets: 369 K/uL (ref 150.0–400.0)
RBC: 5.18 Mil/uL (ref 4.22–5.81)
RDW: 13 % (ref 11.5–15.5)
WBC: 6.9 K/uL (ref 4.0–10.5)

## 2024-04-22 LAB — PROTIME-INR
INR: 0.9 ratio (ref 0.8–1.0)
Prothrombin Time: 9.8 s (ref 9.6–13.1)

## 2024-04-22 LAB — APTT: aPTT: 29.4 s (ref 25.4–36.8)

## 2024-04-25 ENCOUNTER — Ambulatory Visit: Payer: Self-pay | Admitting: Pulmonary Disease
